# Patient Record
Sex: Male | Born: 1965
Health system: Southern US, Community
[De-identification: ages and names within clinical notes are randomized; demographics above are authoritative.]

## PROBLEM LIST (undated history)

## (undated) DIAGNOSIS — F419 Anxiety disorder, unspecified: Secondary | ICD-10-CM

## (undated) DIAGNOSIS — M779 Enthesopathy, unspecified: Secondary | ICD-10-CM

## (undated) DIAGNOSIS — I10 Essential (primary) hypertension: Secondary | ICD-10-CM

## (undated) DIAGNOSIS — E785 Hyperlipidemia, unspecified: Secondary | ICD-10-CM

## (undated) DIAGNOSIS — R002 Palpitations: Secondary | ICD-10-CM

## (undated) DIAGNOSIS — N182 Chronic kidney disease, stage 2 (mild): Secondary | ICD-10-CM

## (undated) HISTORY — DX: Essential (primary) hypertension: I10

## (undated) HISTORY — DX: Anxiety disorder, unspecified: F41.9

## (undated) HISTORY — PX: COLONOSCOPY: SHX174

## (undated) HISTORY — PX: POLYPECTOMY: SHX149

## (undated) HISTORY — DX: Palpitations: R00.2

## (undated) HISTORY — DX: Hyperlipidemia, unspecified: E78.5

## (undated) HISTORY — PX: MOUTH SURGERY: SHX715

---

## 2018-03-24 ENCOUNTER — Ambulatory Visit (INDEPENDENT_AMBULATORY_CARE_PROVIDER_SITE_OTHER): Payer: Self-pay | Admitting: Nurse Practitioner

## 2018-03-24 VITALS — BP 140/95 | HR 64 | Temp 98.4°F | Resp 16 | Wt 147.6 lb

## 2018-03-24 DIAGNOSIS — R030 Elevated blood-pressure reading, without diagnosis of hypertension: Secondary | ICD-10-CM

## 2018-03-24 MED ORDER — AMLODIPINE BESYLATE 5 MG PO TABS: 5.0000 mg | ORAL_TABLET | Freq: Every day | ORAL | 0 refills | Status: DC

## 2018-03-24 NOTE — Patient Instructions (Addendum)
Preventing Hypertension Please call [facility] tomorrow to make an appointment for next week.  [phone]. Start Amlodipine 5mg  daily.  May return to [facility] in 2 weeks for a BP check if appointment cannot be made before that time.  If you become lightheaded, dizzy or other concerns stop medication and contact our office. Keep diary of blood pressure readings, check at least once daily, twice daily would be best. Go to the ER if you develop chest pain, SOB, difficulty breathing, irregular heart rate or other concerns.  Hypertension, commonly called high blood pressure, is when the force of blood pumping through the arteries is too strong. Arteries are blood vessels that carry blood from the heart throughout the body. Over time, hypertension can damage the arteries and decrease blood flow to important parts of the body, including the brain, heart, and kidneys. Often, hypertension does not cause symptoms until blood pressure is very high. For this reason, it is important to have your blood pressure checked on a regular basis. Hypertension can often be prevented with diet and lifestyle changes. If you already have hypertension, you can control it with diet and lifestyle changes, as well as medicine. What nutrition changes can be made? Maintain a healthy diet. This includes:  Eating less salt (sodium). Ask your health care provider how much sodium is safe for you to have. The general recommendation is to consume less than 1 tsp (2,300 mg) of sodium a day. ? Do not add salt to your food. ? Choose low-sodium options when grocery shopping and eating out.  Limiting fats in your diet. You can do this by eating low-fat or fat-free dairy products and by eating less red meat.  Eating more fruits, vegetables, and whole grains. Make a goal to eat: ? 1-2 cups of fresh fruits and vegetables each day. ? 3-4 servings of whole grains each day.  Avoiding foods and beverages that have added  sugars.  Eating fish that contain healthy fats (omega-3 fatty acids), such as mackerel or salmon.  If you need help putting together a healthy eating plan, try the DASH diet. This diet is high in fruits, vegetables, and whole grains. It is low in sodium, red meat, and added sugars. DASH stands for Dietary Approaches to Stop Hypertension. What lifestyle changes can be made?  Lose weight if you are overweight. Losing just 3?5% of your body weight can help prevent or control hypertension. ? For example, if your present weight is 200 lb (91 kg), a loss of 3-5% of your weight means losing 6-10 lb (2.7-4.5 kg). ? Ask your health care provider to help you with a diet and exercise plan to safely lose weight.  Get enough exercise. Do at least 150 minutes of moderate-intensity exercise each week. ? You could do this in short exercise sessions several times a day, or you could do longer exercise sessions a few times a week. For example, you could take a brisk 10-minute walk or bike ride, 3 times a day, for 5 days a week.  Find ways to reduce stress, such as exercising, meditating, listening to music, or taking a yoga class. If you need help reducing stress, ask your health care provider.  Do not smoke. This includes e-cigarettes. Chemicals in tobacco and nicotine products raise your blood pressure each time you smoke. If you need help quitting, ask your health care provider.  Avoid alcohol. If you drink alcohol, limit alcohol intake to no more than 1 drink a day for nonpregnant  women and 2 drinks a day for men. One drink equals 12 oz of beer, 5 oz of wine, or 1 oz of hard liquor. Why are these changes important? Diet and lifestyle changes can help you prevent hypertension, and they may make you feel better overall and improve your quality of life. If you have hypertension, making these changes will help you control it and help prevent major complications, such as:  Hardening and narrowing of arteries  that supply blood to: ? Your heart. This can cause a heart attack. ? Your brain. This can cause a stroke. ? Your kidneys. This can cause kidney failure.  Stress on your heart muscle, which can cause heart failure.  What can I do to lower my risk?  Work with your health care provider to make a hypertension prevention plan that works for you. Follow your plan and keep all follow-up visits as told by your health care provider.  Learn how to check your blood pressure at home. Make sure that you know your personal target blood pressure, as told by your health care provider. How is this treated? In addition to diet and lifestyle changes, your health care provider may recommend medicines to help lower your blood pressure. You may need to try a few different medicines to find what works best for you. You also may need to take more than one medicine. Take over-the-counter and prescription medicines only as told by your health care provider. Where to find support: Your health care provider can help you prevent hypertension and help you keep your blood pressure at a healthy level. Your local hospital or your community may also provide support services and prevention programs. The American Heart Association offers an online support network at: CheapBootlegs.com.cy Where to find more information: Learn more about hypertension from:  National Heart, Lung, and Blood Institute: ElectronicHangman.is  Centers for Disease Control and Prevention: https://ingram.com/  American Academy of Family Physicians: http://familydoctor.org/familydoctor/en/diseases-conditions/high-blood-pressure.printerview.all.html  Learn more about the DASH diet from:  [place], Lung, and [place]: https://www.reyes.com/  Contact a health care provider if:  You think you are having a reaction to medicines you have  taken.  You have recurrent headaches or feel dizzy.  You have swelling in your ankles.  You have trouble with your vision. Summary  Hypertension often does not cause any symptoms until blood pressure is very high. It is important to get your blood pressure checked regularly.  Diet and lifestyle changes are the most important steps in preventing hypertension.  By keeping your blood pressure in a healthy range, you can prevent complications like heart attack, heart failure, stroke, and kidney failure.  Work with your health care provider to make a hypertension prevention plan that works for you. This information is not intended to replace advice given to you by your health care provider. Make sure you discuss any questions you have with your health care provider. Document Released: 09/08/2015 Document Revised: 05/04/2016 Document Reviewed: 05/04/2016 Elsevier Interactive Patient Education  2018 [employer].  Managing Your Hypertension Hypertension is commonly called high blood pressure. This is when the force of your blood pressing against the walls of your arteries is too strong. Arteries are blood vessels that carry blood from your heart throughout your body. Hypertension forces the heart to work harder to pump blood, and may cause the arteries to become narrow or stiff. Having untreated or uncontrolled hypertension can cause heart attack, stroke, kidney disease, and other problems. What are blood pressure readings? A blood pressure  reading consists of a higher number over a lower number. Ideally, your blood pressure should be below 120/80. The first ("top") number is called the systolic pressure. It is a measure of the pressure in your arteries as your heart beats. The second ("bottom") number is called the diastolic pressure. It is a measure of the pressure in your arteries as the heart relaxes. What does my blood pressure reading mean? Blood pressure is classified into four stages.  Based on your blood pressure reading, your health care provider may use the following stages to determine what type of treatment you need, if any. Systolic pressure and diastolic pressure are measured in a unit called mm Hg. Normal  Systolic pressure: below 322.  Diastolic pressure: below 80. Elevated  Systolic pressure: 025-427.  Diastolic pressure: below 80. Hypertension stage 1  Systolic pressure: 062-376.  Diastolic pressure: 28-31. Hypertension stage 2  Systolic pressure: 517 or above.  Diastolic pressure: 90 or above. What health risks are associated with hypertension? Managing your hypertension is an important responsibility. Uncontrolled hypertension can lead to:  A heart attack.  A stroke.  A weakened blood vessel (aneurysm).  Heart failure.  Kidney damage.  Eye damage.  Metabolic syndrome.  Memory and concentration problems.  What changes can I make to manage my hypertension? Hypertension can be managed by making lifestyle changes and possibly by taking medicines. Your health care provider will help you make a plan to bring your blood pressure within a normal range. Eating and drinking  Eat a diet that is high in fiber and potassium, and low in salt (sodium), added sugar, and fat. An example eating plan is called the DASH (Dietary Approaches to Stop Hypertension) diet. To eat this way: ? Eat plenty of fresh fruits and vegetables. Try to fill half of your plate at each meal with fruits and vegetables. ? Eat whole grains, such as whole wheat pasta, brown rice, or whole grain bread. Fill about one quarter of your plate with whole grains. ? Eat low-fat diary products. ? Avoid fatty cuts of meat, processed or cured meats, and poultry with skin. Fill about one quarter of your plate with lean proteins such as fish, chicken without skin, beans, eggs, and tofu. ? Avoid premade and processed foods. These tend to be higher in sodium, added sugar, and fat.  Reduce  your daily sodium intake. Most people with hypertension should eat less than 1,500 mg of sodium a day.  Limit alcohol intake to no more than 1 drink a day for nonpregnant women and 2 drinks a day for men. One drink equals 12 oz of beer, 5 oz of wine, or 1 oz of hard liquor. Lifestyle  Work with your health care provider to maintain a healthy body weight, or to lose weight. Ask what an ideal weight is for you.  Get at least 30 minutes of exercise that causes your heart to beat faster (aerobic exercise) most days of the week. Activities may include walking, swimming, or biking.  Include exercise to strengthen your muscles (resistance exercise), such as weight lifting, as part of your weekly exercise routine. Try to do these types of exercises for 30 minutes at least 3 days a week.  Do not use any products that contain nicotine or tobacco, such as cigarettes and e-cigarettes. If you need help quitting, ask your health care provider.  Control any long-term (chronic) conditions you have, such as high cholesterol or diabetes. Monitoring  Monitor your blood pressure at home as told  by your health care provider. Your personal target blood pressure may vary depending on your medical conditions, your age, and other factors.  Have your blood pressure checked regularly, as often as told by your health care provider. Working with your health care provider  Review all the medicines you take with your health care provider because there may be side effects or interactions.  Talk with your health care provider about your diet, exercise habits, and other lifestyle factors that may be contributing to hypertension.  Visit your health care provider regularly. Your health care provider can help you create and adjust your plan for managing hypertension. Will I need medicine to control my blood pressure? Your health care provider may prescribe medicine if lifestyle changes are not enough to get your blood  pressure under control, and if:  Your systolic blood pressure is 130 or higher.  Your diastolic blood pressure is 80 or higher.  Take medicines only as told by your health care provider. Follow the directions carefully. Blood pressure medicines must be taken as prescribed. The medicine does not work as well when you skip doses. Skipping doses also puts you at risk for problems. Contact a health care provider if:  You think you are having a reaction to medicines you have taken.  You have repeated (recurrent) headaches.  You feel dizzy.  You have swelling in your ankles.  You have trouble with your vision. Get help right away if:  You develop a severe headache or confusion.  You have unusual weakness or numbness, or you feel faint.  You have severe pain in your chest or abdomen.  You vomit repeatedly.  You have trouble breathing. Summary  Hypertension is when the force of blood pumping through your arteries is too strong. If this condition is not controlled, it may put you at risk for serious complications.  Your personal target blood pressure may vary depending on your medical conditions, your age, and other factors. For most people, a normal blood pressure is less than 120/80.  Hypertension is managed by lifestyle changes, medicines, or both. Lifestyle changes include weight loss, eating a healthy, low-sodium diet, exercising more, and limiting alcohol. This information is not intended to replace advice given to you by your health care provider. Make sure you discuss any questions you have with your health care provider. Document Released: 05/18/2012 Document Revised: 07/22/2016 Document Reviewed: 07/22/2016 Elsevier Interactive Patient Education  2018 [place] Eating Plan DASH stands for "Dietary Approaches to Stop Hypertension." The DASH eating plan is a healthy eating plan that has been shown to reduce high blood pressure (hypertension). It may also reduce your  risk for type 2 diabetes, heart disease, and stroke. The DASH eating plan may also help with weight loss. What are tips for following this plan? General guidelines  Avoid eating more than 2,300 mg (milligrams) of salt (sodium) a day. If you have hypertension, you may need to reduce your sodium intake to 1,500 mg a day.  Limit alcohol intake to no more than 1 drink a day for nonpregnant women and 2 drinks a day for men. One drink equals 12 oz of beer, 5 oz of wine, or 1 oz of hard liquor.  Work with your health care provider to maintain a healthy body weight or to lose weight. Ask what an ideal weight is for you.  Get at least 30 minutes of exercise that causes your heart to beat faster (aerobic exercise) most days of the week. Activities  may include walking, swimming, or biking.  Work with your health care provider or diet and nutrition specialist (dietitian) to adjust your eating plan to your individual calorie needs. Reading food labels  Check food labels for the amount of sodium per serving. Choose foods with less than 5 percent of the Daily Value of sodium. Generally, foods with less than 300 mg of sodium per serving fit into this eating plan.  To find whole grains, look for the word "whole" as the first word in the ingredient list. Shopping  Buy products labeled as "low-sodium" or "no salt added."  Buy fresh foods. Avoid canned foods and premade or frozen meals. Cooking  Avoid adding salt when cooking. Use salt-free seasonings or herbs instead of table salt or sea salt. Check with your health care provider or pharmacist before using salt substitutes.  Do not fry foods. Cook foods using healthy methods such as baking, boiling, grilling, and broiling instead.  Cook with heart-healthy oils, such as olive, canola, soybean, or sunflower oil. Meal planning   Eat a balanced diet that includes: ? 5 or more servings of fruits and vegetables each day. At each meal, try to fill half of  your plate with fruits and vegetables. ? Up to 6-8 servings of whole grains each day. ? Less than 6 oz of lean meat, poultry, or fish each day. A 3-oz serving of meat is about the same size as a deck of cards. One egg equals 1 oz. ? 2 servings of low-fat dairy each day. ? A serving of nuts, seeds, or beans 5 times each week. ? Heart-healthy fats. Healthy fats called Omega-3 fatty acids are found in foods such as flaxseeds and coldwater fish, like sardines, salmon, and mackerel.  Limit how much you eat of the following: ? Canned or prepackaged foods. ? Food that is high in trans fat, such as fried foods. ? Food that is high in saturated fat, such as fatty meat. ? Sweets, desserts, sugary drinks, and other foods with added sugar. ? Full-fat dairy products.  Do not salt foods before eating.  Try to eat at least 2 vegetarian meals each week.  Eat more home-cooked food and less restaurant, buffet, and fast food.  When eating at a restaurant, ask that your food be prepared with less salt or no salt, if possible. What foods are recommended? The items listed may not be a complete list. Talk with your dietitian about what dietary choices are best for you. Grains Whole-grain or whole-wheat bread. Whole-grain or whole-wheat pasta. Brown rice. [place]. Bulgur. Whole-grain and low-sodium cereals. Pita bread. Low-fat, low-sodium crackers. Whole-wheat flour tortillas. Vegetables Fresh or frozen vegetables (raw, steamed, roasted, or grilled). Low-sodium or reduced-sodium tomato and vegetable juice. Low-sodium or reduced-sodium tomato sauce and tomato paste. Low-sodium or reduced-sodium canned vegetables. Fruits All fresh, dried, or frozen fruit. Canned fruit in natural juice (without added sugar). Meat and other protein foods Skinless chicken or [place]. Ground chicken or [place]. Pork with fat trimmed off. Fish and seafood. Egg whites. Dried beans, peas, or lentils. Unsalted nuts, nut butters,  and seeds. Unsalted canned beans. Lean cuts of beef with fat trimmed off. Low-sodium, lean deli meat. Dairy Low-fat (1%) or fat-free (skim) milk. Fat-free, low-fat, or reduced-fat cheeses. Nonfat, low-sodium ricotta or cottage cheese. Low-fat or nonfat yogurt. Low-fat, low-sodium cheese. Fats and oils Soft margarine without trans fats. Vegetable oil. Low-fat, reduced-fat, or light mayonnaise and salad dressings (reduced-sodium). Canola, safflower, olive, soybean, and sunflower oils. Avocado. Seasoning  and other foods Herbs. Spices. Seasoning mixes without salt. Unsalted popcorn and pretzels. Fat-free sweets. What foods are not recommended? The items listed may not be a complete list. Talk with your dietitian about what dietary choices are best for you. Grains Baked goods made with fat, such as croissants, muffins, or some breads. Dry pasta or rice meal packs. Vegetables Creamed or fried vegetables. Vegetables in a cheese sauce. Regular canned vegetables (not low-sodium or reduced-sodium). Regular canned tomato sauce and paste (not low-sodium or reduced-sodium). Regular tomato and vegetable juice (not low-sodium or reduced-sodium). Angie Fava. Olives. Fruits Canned fruit in a light or heavy syrup. Fried fruit. Fruit in cream or butter sauce. Meat and other protein foods Fatty cuts of meat. Ribs. Fried meat. Berniece Salines. Sausage. Bologna and other processed lunch meats. Salami. Fatback. Hotdogs. Bratwurst. Salted nuts and seeds. Canned beans with added salt. Canned or smoked fish. Whole eggs or egg yolks. Chicken or [place] with skin. Dairy Whole or 2% milk, cream, and half-and-half. Whole or full-fat cream cheese. Whole-fat or sweetened yogurt. Full-fat cheese. Nondairy creamers. Whipped toppings. Processed cheese and cheese spreads. Fats and oils Butter. Stick margarine. Lard. Shortening. Ghee. Bacon fat. Tropical oils, such as coconut, palm kernel, or palm oil. Seasoning and other foods Salted popcorn  and pretzels. Onion salt, garlic salt, seasoned salt, table salt, and sea salt. Worcestershire sauce. Tartar sauce. Barbecue sauce. Teriyaki sauce. Soy sauce, including reduced-sodium. Steak sauce. Canned and packaged gravies. Fish sauce. Oyster sauce. Cocktail sauce. Horseradish that you find on the shelf. Ketchup. Mustard. Meat flavorings and tenderizers. Bouillon cubes. Hot sauce and Tabasco sauce. Premade or packaged marinades. Premade or packaged taco seasonings. Relishes. Regular salad dressings. Where to find more information:  National Heart, Lung, and [place]: https://wilson-eaton.com/  American Heart Association: www.heart.org Summary  The DASH eating plan is a healthy eating plan that has been shown to reduce high blood pressure (hypertension). It may also reduce your risk for type 2 diabetes, heart disease, and stroke.  With the DASH eating plan, you should limit salt (sodium) intake to 2,300 mg a day. If you have hypertension, you may need to reduce your sodium intake to 1,500 mg a day.  When on the DASH eating plan, aim to eat more fresh fruits and vegetables, whole grains, lean proteins, low-fat dairy, and heart-healthy fats.  Work with your health care provider or diet and nutrition specialist (dietitian) to adjust your eating plan to your individual calorie needs. This information is not intended to replace advice given to you by your health care provider. Make sure you discuss any questions you have with your health care provider. Document Released: 08/13/2011 Document Revised: 08/17/2016 Document Reviewed: 08/17/2016 Elsevier Interactive Patient Education  2018 [employer].   Hypertension Hypertension, commonly called high blood pressure, is when the force of blood pumping through the arteries is too strong. The arteries are the blood vessels that carry blood from the heart throughout the body. Hypertension forces the heart to work harder to pump blood and may cause arteries  to become narrow or stiff. Having untreated or uncontrolled hypertension can cause heart attacks, strokes, kidney disease, and other problems. A blood pressure reading consists of a higher number over a lower number. Ideally, your blood pressure should be below 120/80. The first ("top") number is called the systolic pressure. It is a measure of the pressure in your arteries as your heart beats. The second ("bottom") number is called the diastolic pressure. It is a measure of the pressure  in your arteries as the heart relaxes. What are the causes? The cause of this condition is not known. What increases the risk? Some risk factors for high blood pressure are under your control. Others are not. Factors you can change  Smoking.  Having type 2 diabetes mellitus, high cholesterol, or both.  Not getting enough exercise or physical activity.  Being overweight.  Having too much fat, sugar, calories, or salt (sodium) in your diet.  Drinking too much alcohol. Factors that are difficult or impossible to change  Having chronic kidney disease.  Having a family history of high blood pressure.  Age. Risk increases with age.  Race. You may be at higher risk if you are African-American.  Gender. Men are at higher risk than women before age 80. After age 36, women are at higher risk than men.  Having obstructive sleep apnea.  Stress. What are the signs or symptoms? Extremely high blood pressure (hypertensive crisis) may cause:  Headache.  Anxiety.  Shortness of breath.  Nosebleed.  Nausea and vomiting.  Severe chest pain.  Jerky movements you cannot control (seizures).  How is this diagnosed? This condition is diagnosed by measuring your blood pressure while you are seated, with your arm resting on a surface. The cuff of the blood pressure monitor will be placed directly against the skin of your upper arm at the level of your heart. It should be measured at least twice using the  same arm. Certain conditions can cause a difference in blood pressure between your right and left arms. Certain factors can cause blood pressure readings to be lower or higher than normal (elevated) for a short period of time:  When your blood pressure is higher when you are in a health care provider's office than when you are at home, this is called white coat hypertension. Most people with this condition do not need medicines.  When your blood pressure is higher at home than when you are in a health care provider's office, this is called masked hypertension. Most people with this condition may need medicines to control blood pressure.  If you have a high blood pressure reading during one visit or you have normal blood pressure with other risk factors:  You may be asked to return on a different day to have your blood pressure checked again.  You may be asked to monitor your blood pressure at home for 1 week or longer.  If you are diagnosed with hypertension, you may have other blood or imaging tests to help your health care provider understand your overall risk for other conditions. How is this treated? This condition is treated by making healthy lifestyle changes, such as eating healthy foods, exercising more, and reducing your alcohol intake. Your health care provider may prescribe medicine if lifestyle changes are not enough to get your blood pressure under control, and if:  Your systolic blood pressure is above 130.  Your diastolic blood pressure is above 80.  Your personal target blood pressure may vary depending on your medical conditions, your age, and other factors. Follow these instructions at home: Eating and drinking  Eat a diet that is high in fiber and potassium, and low in sodium, added sugar, and fat. An example eating plan is called the DASH (Dietary Approaches to Stop Hypertension) diet. To eat this way: ? Eat plenty of fresh fruits and vegetables. Try to fill half of your  plate at each meal with fruits and vegetables. ? Eat whole grains, such as  whole wheat pasta, brown rice, or whole grain bread. Fill about one quarter of your plate with whole grains. ? Eat or drink low-fat dairy products, such as skim milk or low-fat yogurt. ? Avoid fatty cuts of meat, processed or cured meats, and poultry with skin. Fill about one quarter of your plate with lean proteins, such as fish, chicken without skin, beans, eggs, and tofu. ? Avoid premade and processed foods. These tend to be higher in sodium, added sugar, and fat.  Reduce your daily sodium intake. Most people with hypertension should eat less than 1,500 mg of sodium a day.  Limit alcohol intake to no more than 1 drink a day for nonpregnant women and 2 drinks a day for men. One drink equals 12 oz of beer, 5 oz of wine, or 1 oz of hard liquor. Lifestyle  Work with your health care provider to maintain a healthy body weight or to lose weight. Ask what an ideal weight is for you.  Get at least 30 minutes of exercise that causes your heart to beat faster (aerobic exercise) most days of the week. Activities may include walking, swimming, or biking.  Include exercise to strengthen your muscles (resistance exercise), such as pilates or lifting weights, as part of your weekly exercise routine. Try to do these types of exercises for 30 minutes at least 3 days a week.  Do not use any products that contain nicotine or tobacco, such as cigarettes and e-cigarettes. If you need help quitting, ask your health care provider.  Monitor your blood pressure at home as told by your health care provider.  Keep all follow-up visits as told by your health care provider. This is important. Medicines  Take over-the-counter and prescription medicines only as told by your health care provider. Follow directions carefully. Blood pressure medicines must be taken as prescribed.  Do not skip doses of blood pressure medicine. Doing this puts you  at risk for problems and can make the medicine less effective.  Ask your health care provider about side effects or reactions to medicines that you should watch for. Contact a health care provider if:  You think you are having a reaction to a medicine you are taking.  You have headaches that keep coming back (recurring).  You feel dizzy.  You have swelling in your ankles.  You have trouble with your vision. Get help right away if:  You develop a severe headache or confusion.  You have unusual weakness or numbness.  You feel faint.  You have severe pain in your chest or abdomen.  You vomit repeatedly.  You have trouble breathing. Summary  Hypertension is when the force of blood pumping through your arteries is too strong. If this condition is not controlled, it may put you at risk for serious complications.  Your personal target blood pressure may vary depending on your medical conditions, your age, and other factors. For most people, a normal blood pressure is less than 120/80.  Hypertension is treated with lifestyle changes, medicines, or a combination of both. Lifestyle changes include weight loss, eating a healthy, low-sodium diet, exercising more, and limiting alcohol. This information is not intended to replace advice given to you by your health care provider. Make sure you discuss any questions you have with your health care provider. Document Released: 08/24/2005 Document Revised: 07/22/2016 Document Reviewed: 07/22/2016 Elsevier Interactive Patient Education  [employer].

## 2018-03-24 NOTE — Progress Notes (Addendum)
Subjective:    Travis Huynh is a 52 y.o. 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  The patient denies any previous history of HTN.   Cardiac symptoms: none. Patient denies: chest pain, chest pressure/discomfort, dyspnea, exertional chest pressure/discomfort, fatigue, irregular heart beat, lower extremity edema and palpitations. Cardiovascular risk factors: family history of premature cardiovascular disease, male gender, sedentary lifestyle and smoking/ tobacco exposure. Use of agents associated with hypertension: none. History of target organ damage: none.  The patient does admit to being a 20-year pack per day smoker, drinks, and has a diet that contains red meat and pork.  The patient denies exercising at this time.  The following portions of the patient's history were reviewed and updated as appropriate: allergies, current medications and past medical history.  Review of Systems Constitutional: negative Eyes: negative Ears, nose, mouth, throat, and face: negative Respiratory: negative Cardiovascular: positive for elevated BP, negative for See HPI Gastrointestinal: negative Neurological: negative   Objective:    BP (!) 140/95 (BP Location: Right Arm, Patient Position: Sitting, Cuff Size: Normal)   Pulse 64   Temp 98.4 F (36.9 C) (Oral)   Resp 16   Wt 147 lb 9.6 oz (67 kg)   SpO2 98%  General appearance: alert, cooperative and no distress Head: Normocephalic, without obvious abnormality, atraumatic Eyes: conjunctivae/corneas clear. PERRL, EOM's intact. Fundi benign. Ears: normal TM's and external ear canals both ears Nose: Nares normal. Septum midline. Mucosa normal. No drainage or sinus tenderness. Throat: lips, mucosa, and tongue normal; teeth and gums normal Lungs: clear to auscultation bilaterally Heart: regular rate and rhythm, S1, S2  normal, no murmur, click, rub or gallop Abdomen: soft, non-tender; bowel sounds normal; no masses,  no organomegaly Extremities: edema non-pitting edema noted bilaterally, +1 Pulses: 2+ and symmetric Skin: Skin color, texture, turgor normal. No rashes or lesions Lymph nodes: cervical and submandibular nodes normal Neurologic: Grossly normal  Blood pressure recheck: 148/98  Assessment:   Elevated BP without diagnosis of Hypertension.    Plan:  Exam findings, diagnosis etiology and medication use and indications reviewed with patient. Follow- Up and discharge instructions provided. No emergent/urgent issues found on exam.  Patient verbalized understanding of information provided and agrees with plan of care (POC), all questions answered.  I contacted [facility] to schedule an appointment for the patient.  Per receptionist need to speak with Susette Racer, however she was out of the office left a detailed message on Ms. Solomon's voicemail with the patient's information to contact him to schedule an appointment, hopefully within the next week.  Patient is going back to employee health to inform them of his visit with Leodis Binet so he is able to start has an appointment on April 11, 2018.  1. Elevated blood pressure reading in office without diagnosis of hypertension  - amLODipine (NORVASC) 5 MG tablet; Take 1 tablet (5 mg total) by mouth daily.  Dispense: 30 tablet; Refill: 0 --Please call [facility] tomorrow to make an appointment for next week.  [phone].  -Start Amlodipine 5mg  daily.  May return to [facility] in 2 weeks for a BP check if appointment cannot be made before that time.  If you become lightheaded, dizzy or other concerns stop medication and contact our office. -Keep diary of blood pressure readings, check at least once daily, twice daily would be best. -Go to the ER  if you develop chest pain, SOB, difficulty breathing, irregular heart rate  or other concerns.

## 2018-04-11 ENCOUNTER — Ambulatory Visit: Payer: Self-pay | Admitting: Family Medicine

## 2018-04-12 ENCOUNTER — Other Ambulatory Visit: Payer: Self-pay | Admitting: Nurse Practitioner

## 2018-05-14 DIAGNOSIS — H5213 Myopia, bilateral: Secondary | ICD-10-CM | POA: Diagnosis not present

## 2018-05-26 DIAGNOSIS — H538 Other visual disturbances: Secondary | ICD-10-CM | POA: Diagnosis not present

## 2018-05-26 DIAGNOSIS — E782 Mixed hyperlipidemia: Secondary | ICD-10-CM | POA: Diagnosis not present

## 2018-06-01 DIAGNOSIS — Z0000 Encounter for general adult medical examination without abnormal findings: Secondary | ICD-10-CM | POA: Diagnosis not present

## 2019-06-23 ENCOUNTER — Ambulatory Visit: Payer: 59 | Admitting: Family Medicine

## 2019-06-26 ENCOUNTER — Encounter: Payer: Self-pay | Admitting: Family Medicine

## 2019-07-20 DIAGNOSIS — Z20828 Contact with and (suspected) exposure to other viral communicable diseases: Secondary | ICD-10-CM | POA: Diagnosis not present

## 2020-01-12 DIAGNOSIS — L639 Alopecia areata, unspecified: Secondary | ICD-10-CM | POA: Diagnosis not present

## 2020-01-12 MED FILL — CLOBETASOL PROPIONATE 0.05: 0.05 | 14 days supply | Qty: 50 | Fill #0

## 2020-02-21 ENCOUNTER — Ambulatory Visit: Payer: 59 | Admitting: Family Medicine

## 2020-02-21 ENCOUNTER — Encounter: Payer: Self-pay | Admitting: Family Medicine

## 2020-02-21 ENCOUNTER — Other Ambulatory Visit: Payer: Self-pay

## 2020-02-21 VITALS — BP 136/100 | HR 66 | Temp 98.3°F | Ht 67.0 in | Wt 157.0 lb

## 2020-02-21 DIAGNOSIS — L659 Nonscarring hair loss, unspecified: Secondary | ICD-10-CM | POA: Diagnosis not present

## 2020-02-21 DIAGNOSIS — Z1159 Encounter for screening for other viral diseases: Secondary | ICD-10-CM | POA: Diagnosis not present

## 2020-02-21 DIAGNOSIS — I10 Essential (primary) hypertension: Secondary | ICD-10-CM

## 2020-02-21 DIAGNOSIS — Z13220 Encounter for screening for lipoid disorders: Secondary | ICD-10-CM

## 2020-02-21 DIAGNOSIS — Z23 Encounter for immunization: Secondary | ICD-10-CM | POA: Diagnosis not present

## 2020-02-21 DIAGNOSIS — Z1211 Encounter for screening for malignant neoplasm of colon: Secondary | ICD-10-CM | POA: Diagnosis not present

## 2020-02-21 DIAGNOSIS — R079 Chest pain, unspecified: Secondary | ICD-10-CM

## 2020-02-21 DIAGNOSIS — R0789 Other chest pain: Secondary | ICD-10-CM | POA: Diagnosis not present

## 2020-02-21 DIAGNOSIS — Z131 Encounter for screening for diabetes mellitus: Secondary | ICD-10-CM

## 2020-02-21 DIAGNOSIS — N529 Male erectile dysfunction, unspecified: Secondary | ICD-10-CM

## 2020-02-21 MED ORDER — AMLODIPINE BESYLATE 2.5 MG PO TABS: 2.5000 mg | ORAL_TABLET | Freq: Every day | ORAL | 1 refills | Status: DC

## 2020-02-21 NOTE — Patient Instructions (Addendum)
I will check some bloodwork today, but follow up with dermatology.  Try lower dose of amlodipine to see if that is better tolerated.  I will check some other blood work as discussed but can also discussed the erectile dysfunction further next time. Return to the clinic or go to the nearest emergency room if any of your symptoms worsen or new symptoms occur.  Chest pain sounds like chest wall pain. Range of motion as we discussed, but follow up in 2 weeks to discuss further. If any worsening symptoms -be seen in ER or call 911.     Erectile Dysfunction Erectile dysfunction (ED) is the inability to get or keep an erection in order to have sexual intercourse. Erectile dysfunction may include:  Inability to get an erection.  Lack of enough hardness of the erection to allow penetration.  Loss of the erection before sex is finished. What are the causes? This condition may be caused by:  Certain medicines, such as: ? Pain relievers. ? Antihistamines. ? Antidepressants. ? Blood pressure medicines. ? Water pills (diuretics). ? Ulcer medicines. ? Muscle relaxants. ? Drugs.  Excessive drinking.  Psychological causes, such as: ? Anxiety. ? Depression. ? Sadness. ? Exhaustion. ? Performance fear. ? Stress.  Physical causes, such as: ? Artery problems. This may include diabetes, smoking, liver disease, or atherosclerosis. ? High blood pressure. ? Hormonal problems, such as low testosterone. ? Obesity. ? Nerve problems. This may include back or pelvic injuries, diabetes mellitus, multiple sclerosis, or Parkinson disease. What are the signs or symptoms? Symptoms of this condition include:  Inability to get an erection.  Lack of enough hardness of the erection to allow penetration.  Loss of the erection before sex is finished.  Normal erections at some times, but with frequent unsatisfactory episodes.  Low sexual satisfaction in either partner due to erection problems.  A  curved penis occurring with erection. The curve may cause pain or the penis may be too curved to allow for intercourse.  Never having nighttime erections. How is this diagnosed? This condition is often diagnosed by:  Performing a physical exam to find other diseases or specific problems with the penis.  Asking you detailed questions about the problem.  Performing blood tests to check for diabetes mellitus or to measure hormone levels.  Performing other tests to check for underlying health conditions.  Performing an ultrasound exam to check for scarring.  Performing a test to check blood flow to the penis.  Doing a sleep study at home to measure nighttime erections. How is this treated? This condition may be treated by:  Medicine taken by mouth to help you achieve an erection (oral medicine).  Hormone replacement therapy to replace low testosterone levels.  Medicine that is injected into the penis. Your health care provider may instruct you how to give yourself these injections at home.  Vacuum pump. This is a pump with a ring on it. The pump and ring are placed on the penis and used to create pressure that helps the penis become erect.  Penile implant surgery. In this procedure, you may receive: ? An inflatable implant. This consists of cylinders, a pump, and a reservoir. The cylinders can be inflated with a fluid that helps to create an erection, and they can be deflated after intercourse. ? A semi-rigid implant. This consists of two silicone rubber rods. The rods provide some rigidity. They are also flexible, so the penis can both curve downward in its normal position and become straight  for sexual intercourse.  Blood vessel surgery, to improve blood flow to the penis. During this procedure, a blood vessel from a different part of the body is placed into the penis to allow blood to flow around (bypass) damaged or blocked blood vessels.  Lifestyle changes, such as exercising  more, losing weight, and quitting smoking. Follow these instructions at home: Medicines   Take over-the-counter and prescription medicines only as told by your health care provider. Do not increase the dosage without first discussing it with your health care provider.  If you are using self-injections, perform injections as directed by your health care provider. Make sure to avoid any veins that are on the surface of the penis. After giving an injection, apply pressure to the injection site for 5 minutes. General instructions  Exercise regularly, as directed by your health care provider. Work with your health care provider to lose weight, if needed.  Do not use any products that contain nicotine or tobacco, such as cigarettes and e-cigarettes. If you need help quitting, ask your health care provider.  Before using a vacuum pump, read the instructions that come with the pump and discuss any questions with your health care provider.  Keep all follow-up visits as told by your health care provider. This is important. Contact a health care provider if:  You feel nauseous.  You vomit. Get help right away if:  You are taking oral or injectable medicines and you have an erection that lasts longer than 4 hours. If your health care provider is unavailable, go to the nearest emergency room for evaluation. An erection that lasts much longer than 4 hours can result in permanent damage to your penis.  You have severe pain in your groin or abdomen.  You develop redness or severe swelling of your penis.  You have redness spreading up into your groin or lower abdomen.  You are unable to urinate.  You experience chest pain or a rapid heart beat (palpitations) after taking oral medicines. Summary  Erectile dysfunction (ED) is the inability to get or keep an erection during sexual intercourse. This problem can usually be treated successfully.  This condition is diagnosed based on a physical exam,  your symptoms, and tests to determine the cause. Treatment varies depending on the cause, and may include medicines, hormone therapy, surgery, or vacuum pump.  You may need follow-up visits to make sure that you are using your medicines or devices correctly.  Get help right away if you are taking or injecting medicines and you have an erection that lasts longer than 4 hours. This information is not intended to replace advice given to you by your health care provider. Make sure you discuss any questions you have with your health care provider. Document Revised: 08/06/2017 Document Reviewed: 09/09/2016 Elsevier Patient Education  [place].   Chest Wall Pain Chest wall pain is pain in or around the bones and muscles of your chest. Sometimes, an injury causes this pain. Excessive coughing or overuse of arm and chest muscles may also cause chest wall pain. Sometimes, the cause may not be known. This pain may take several weeks or longer to get better. Follow these instructions at home: Managing pain, stiffness, and swelling   If directed, put ice on the painful area: ? Put ice in a plastic bag. ? Place a towel between your skin and the bag. ? Leave the ice on for 20 minutes, 2-3 times per day. Activity  Rest as told by your  health care provider.  Avoid activities that cause pain. These include any activities that use your chest muscles or your abdominal and side muscles to lift heavy items. Ask your health care provider what activities are safe for you. General instructions   Take over-the-counter and prescription medicines only as told by your health care provider.  Do not use any products that contain nicotine or tobacco, such as cigarettes, e-cigarettes, and chewing tobacco. These can delay healing after injury. If you need help quitting, ask your health care provider.  Keep all follow-up visits as told by your health care provider. This is important. Contact a health care  provider if:  You have a fever.  Your chest pain becomes worse.  You have new symptoms. Get help right away if:  You have nausea or vomiting.  You feel sweaty or light-headed.  You have a cough with mucus from your lungs (sputum) or you cough up blood.  You develop shortness of breath. These symptoms may represent a serious problem that is an emergency. Do not wait to see if the symptoms will go away. Get medical help right away. Call your local emergency services (911 in the [place] Do not drive yourself to the hospital. Summary  Chest wall pain is pain in or around the bones and muscles of your chest.  Depending on the cause, it may be treated with ice, rest, medicines, and avoiding activities that cause pain.  Contact a health care provider if you have a fever, worsening chest pain, or new symptoms.  Get help right away if you feel light-headed or you develop shortness of breath. These symptoms may be an emergency. This information is not intended to replace advice given to you by your health care provider. Make sure you discuss any questions you have with your health care provider. Document Revised: 02/24/2018 Document Reviewed: 02/24/2018 Elsevier Patient Education  [place].   Nonspecific Chest Pain, Adult Chest pain can be caused by many different conditions. It can be caused by a condition that is life-threatening and requires treatment right away. It can also be caused by something that is not life-threatening. If you have chest pain, it can be hard to know the difference, so it is important to get help right away to make sure that you do not have a serious condition. Some life-threatening causes of chest pain include:  Heart attack.  A tear in the body's main blood vessel (aortic dissection).  Inflammation around your heart (pericarditis).  A problem in the lungs, such as a blood clot (pulmonary embolism) or a collapsed lung (pneumothorax). Some non  life-threatening causes of chest pain include:  Heartburn.  Anxiety or stress.  Damage to the bones, muscles, and cartilage that make up your chest wall.  Pneumonia or bronchitis.  Shingles infection (varicella-zoster virus). Chest pain can feel like:  Pain or discomfort on the surface of your chest or deep in your chest.  Crushing, pressure, aching, or squeezing pain.  Burning or tingling.  Dull or sharp pain that is worse when you move, cough, or take a deep breath.  Pain or discomfort that is also felt in your back, neck, jaw, shoulder, or arm, or pain that spreads to any of these areas. Your chest pain may come and go. It may also be constant. Your health care provider will do lab tests and other studies to find the cause of your pain. Treatment will depend on the cause of your chest pain. Follow these instructions at  home: Medicines  Take over-the-counter and prescription medicines only as told by your health care provider.  If you were prescribed an antibiotic, take it as told by your health care provider. Do not stop taking the antibiotic even if you start to feel better. Lifestyle   Rest as directed by your health care provider.  Do not use any products that contain nicotine or tobacco, such as cigarettes and e-cigarettes. If you need help quitting, ask your health care provider.  Do not drink alcohol.  Make healthy lifestyle choices as recommended. These may include: ? Getting regular exercise. Ask your health care provider to suggest some activities that are safe for you. ? Eating a heart-healthy diet. This includes plenty of fresh fruits and vegetables, whole grains, low-fat (lean) protein, and low-fat dairy products. A dietitian can help you find healthy eating options. ? Maintaining a healthy weight. ? Managing any other health conditions you have, such as high blood pressure (hypertension) or diabetes. ? Reducing stress, such as with yoga or relaxation  techniques. General instructions  Pay attention to any changes in your symptoms. Tell your health care provider about them or any new symptoms.  Avoid any activities that cause chest pain.  Keep all follow-up visits as told by your health care provider. This is important. This includes visits for any further testing if your chest pain does not go away. Contact a health care provider if:  Your chest pain does not go away.  You feel depressed.  You have a fever. Get help right away if:  Your chest pain gets worse.  You have a cough that gets worse, or you cough up blood.  You have severe pain in your abdomen.  You faint.  You have sudden, unexplained chest discomfort.  You have sudden, unexplained discomfort in your arms, back, neck, or jaw.  You have shortness of breath at any time.  You suddenly start to sweat, or your skin gets clammy.  You feel nausea or you vomit.  You suddenly feel lightheaded or dizzy.  You have severe weakness, or unexplained weakness or fatigue.  Your heart begins to beat quickly, or it feels like it is skipping beats. These symptoms may represent a serious problem that is an emergency. Do not wait to see if the symptoms will go away. Get medical help right away. Call your local emergency services (911 in the [place] Do not drive yourself to the hospital. Summary  Chest pain can be caused by a condition that is serious and requires urgent treatment. It may also be caused by something that is not life-threatening.  If you have chest pain, it is very important to see your health care provider. Your health care provider may do lab tests and other studies to find the cause of your pain.  Follow your health care provider's instructions on taking medicines, making lifestyle changes, and getting emergency treatment if symptoms become worse.  Keep all follow-up visits as told by your health care provider. This includes visits for any further testing if  your chest pain does not go away. This information is not intended to replace advice given to you by your health care provider. Make sure you discuss any questions you have with your health care provider. Document Revised: 02/24/2018 Document Reviewed: 02/24/2018 Elsevier Patient Education  [place].   Alopecia Areata, Adult  Alopecia areata is a condition that causes you to lose hair. You may lose hair on your scalp in patches. In some cases,  you may lose all the hair on your scalp (alopecia totalis) or all the hair from your face and body (alopecia universalis). Alopecia areata is an autoimmune disease. This means that your body's defense system (immune system) mistakes normal parts of the body for germs or other things that can make you sick. When you have alopecia areata, the immune system attacks the hair follicles. Alopecia areata usually develops in childhood, but it can develop at any age. For some people, their hair grows back on its own and hair loss does not happen again. For others, their hair may fall out and grow back in cycles. The hair loss may last many years. Having this condition can be emotionally difficult, but it is not dangerous. What are the causes? The cause of this condition is not known. What increases the risk? This condition is more likely to develop in people who have:  A family history of alopecia.  A family history of another autoimmune disease, including type 1 diabetes and rheumatoid arthritis.  Asthma and allergies.  Down syndrome. What are the signs or symptoms? Round spots of patchy hair loss on the scalp is the main symptom of this condition. The spots may be mildly itchy. Other symptoms include:  Short dark hairs in the bald patches that are wider at the top (exclamation point hairs).  Dents, white spots, or lines in the fingernails or toenails.  Balding and body hair loss. This is rare. How is this diagnosed? This condition is  diagnosed based on your symptoms and family history. Your health care provider will also check your scalp skin, teeth, and nails. Your health care provider may refer you to a specialist in hair and skin disorders (dermatologist). You may also have tests, including:  A hair pull test.  Blood tests or other screening tests to check for autoimmune diseases, such as thyroid disease or diabetes.  Skin biopsy to confirm the diagnosis.  A procedure to examine the skin with a lighted magnifying instrument (dermoscopy). How is this treated? There is no cure for alopecia areata. Treatment is aimed at promoting the regrowth of hair and preventing the immune system from overreacting. No single treatment is right for all people with alopecia areata. It depends on the type of hair loss you have and how severe it is. Work with your health care provider to find the best treatment for you. Treatment may include:  Having regular checkups to make sure the condition is not getting worse (watchful waiting).  Steroid creams or pills for 6-8 weeks to stop the immune reaction and help hair to regrow more quickly.  Other topical medicines to alter the immune system response and support the hair growth cycle.  Steroid injections.  Therapy and counseling with a support group or therapist if you are having trouble coping with hair loss. Follow these instructions at home:  Learn as much as you can about your condition.  Apply topical creams only as told by your health care provider.  Take over-the-counter and prescription medicines only as told by your health care provider.  Consider getting a wig or products to make hair look fuller or to cover bald spots, if you feel uncomfortable with your appearance.  Get therapy or counseling if you are having a hard time coping with hair loss. Ask your health care provider to recommend a counselor or support group.  Keep all follow-up visits as told by your health care  provider. This is important. Contact a health care provider if:  Your hair loss gets worse, even with treatment.  You have new symptoms.  You are struggling emotionally. Summary  Alopecia areata is an autoimmune condition that makes your body's defense system (immune system) attack the hair follicles. This causes you to lose hair.  Treatments may include regular checkups to make sure that the condition is not getting worse (watchful waiting), medicines, and steroid injections. This information is not intended to replace advice given to you by your health care provider. Make sure you discuss any questions you have with your health care provider. Document Revised: 08/06/2017 Document Reviewed: 09/11/2016 Elsevier Patient Education  [employer].    If you have lab work done today you will be contacted with your lab results within the next 2 weeks.  If you have not heard from [place] then please contact us. The fastest way to get your results is to register for My Chart.   IF you received an x-ray today, you will receive an invoice from [facility]. Please contact [facility] at [phone] with questions or concerns regarding your invoice.   IF you received labwork today, you will receive an invoice from [place]. Please contact [facility] at [phone] with questions or concerns regarding your invoice.   Our billing staff will not be able to assist you with questions regarding bills from these companies.  You will be contacted with the lab results as soon as they are available. The fastest way to get your results is to activate your My Chart account. Instructions are located on the last page of this paperwork. If you have not heard from [place] regarding the results in 2 weeks, please contact this office.

## 2020-02-21 NOTE — Progress Notes (Signed)
Subjective:  Patient ID: Travis Huynh, male    DOB: 1965-10-26  Age: 54 y.o. MRN: [id number]  CC:  Chief Complaint  Patient presents with  . Establish Care    pt reports as far as his general health he feels fine with no complaints. Pt want BLood work done to fine out why hair is falling out.    HPI Travis Huynh presents for   New patient establish care.   Hair loss: Appears he was evaluated by Dr. Renda Rolls on May 7.  Alopecia Areata.  Steroid injections were performed at that time, and clobetasol Rx..  ?bloodwork recommended - routine blood work - no specific recommendations.  Follow up to be determined.   Hypertension: Previously treated with amlodipine 5mg  daily. Did not like how he felt - like in another world. No recent meds.  Home readings: none.  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   Chest pain: Chest pains at work at times, with activity. left or right, but in muscles and able to reproduce with pressing on area. Feels like spasm in muscle. No radiation.  No n/v/dyspena/diaphoresis. Comes and goes.  FH of MI - mom at 62yo. No personal hx of heart disease.   BP Readings from Last 3 Encounters:  02/21/20 (!) 136/100  03/24/18 (!) 140/95   Lab Results  Component Value Date   CREATININE 1.01 02/21/2020     Erectile dysfunction: Requests testoterone level. Longstanding issue. Trouble with maintaining erection, able to obtain. No supplements. Marriage is going well.   HM: Requests colonoscopy.    History There are no problems to display for this patient.  History reviewed. No pertinent past medical history. History reviewed. No pertinent surgical history. No Known Allergies Prior to Admission medications   Medication Sig Start Date End Date Taking? Authorizing Provider  clobetasol (TEMOVATE) 0.05 % external solution Apply 1 application topically 2 (two) times daily. 01/12/20  Yes [provider]   Social History    Socioeconomic History  . Marital status: Unknown    Spouse name: Not on file  . Number of children: Not on file  . Years of education: Not on file  . Highest education level: Not on file  Occupational History  . Not on file  Tobacco Use  . Smoking status: Current Every Day Smoker    Packs/day: 0.25    Types: Cigarettes  . Smokeless tobacco: Never Used  Vaping Use  . Vaping Use: Never used  Substance and Sexual Activity  . Alcohol use: Yes    Alcohol/week: 1.0 standard drink    Types: 1 Cans of beer per week    Comment: 1-2 after work  . Drug use: Never  . Sexual activity: Yes  Other Topics Concern  . Not on file  Social History Narrative  . Not on file   Social Determinants of Health   Financial Resource Strain:   . Difficulty of Paying Living Expenses:   Food Insecurity:   . Worried About Charity fundraiser in the Last Year:   . Arboriculturist in the Last Year:   Transportation Needs:   . Film/video editor (Medical):   Marland Kitchen Lack of Transportation (Non-Medical):   Physical Activity:   . Days of Exercise per Week:   . Minutes of Exercise per Session:   Stress:   . Feeling of Stress :   Social Connections:   . Frequency of Communication with Friends and Family:   .  Frequency of Social Gatherings with Friends and Family:   . Attends Religious Services:   . Active Member of Clubs or Organizations:   . Attends Archivist Meetings:   Marland Kitchen Marital Status:   Intimate Partner Violence:   . Fear of Current or Ex-Partner:   . Emotionally Abused:   Marland Kitchen Physically Abused:   . Sexually Abused:     Review of Systems Per HPI.   Objective:   Vitals:   02/21/20 1003 02/21/20 1016  BP: (!) 169/106 (!) 136/100  Pulse: 66   Temp: 98.3 F (36.8 C)   TempSrc: Temporal   SpO2: 98%   Weight: 157 lb (71.2 kg)   Height: 5\' 7"  (1.702 m)      Physical Exam Vitals reviewed.  Constitutional:      Appearance: He is well-developed.  HENT:     Head:  Normocephalic and atraumatic.  Eyes:     Pupils: Pupils are equal, round, and reactive to light.  Neck:     Vascular: No carotid bruit or JVD.  Cardiovascular:     Rate and Rhythm: Normal rate and regular rhythm.     Heart sounds: Normal heart sounds. No murmur heard.      Comments: Denies active chest pain at this time.  Chest wall nontender. Pulmonary:     Effort: Pulmonary effort is normal.     Breath sounds: Normal breath sounds. No rales.  Skin:    General: Skin is warm and dry.     Comments: Multiple patches of hair loss of scalp, few areas in eyebrows.  Neurological:     General: No focal deficit present.     Mental Status: He is alert and oriented to person, place, and time.    EKG: sinus bradycardia, rate 58. No acute findings, no prior EKG available for review.   Assessment & Plan:  Travis Huynh is a 54 y.o. male . Essential hypertension - Plan: Comprehensive metabolic panel, Lipid panel, Hemoglobin A1c, TSH, amLODipine (NORVASC) 2.5 MG tablet  - trial of lower dose amlodipine than prior, check labs,   Need for hepatitis C screening test - Plan: Hepatitis C antibody  Need for prophylactic vaccination with combined diphtheria-tetanus-pertussis (DTP) vaccine - Plan: Tdap vaccine greater than or equal to 7yo IM  Special screening for malignant neoplasms, colon - Plan: Ambulatory referral to Gastroenterology -  Refer for colonoscopy.   Chest wall pain - Plan: EKG 12-Lead Nonspecific chest pain  -Suspected chest wall pain with reproducible symptoms, asymptomatic at present.  EKG without concerning findings.  RTC/ER precautions if recurrent with history of tobacco use and hypertension as risk factors.  Alopecia  -Alopecia areata, followed by dermatology.  Erectile dysfunction, unspecified erectile dysfunction type - Plan: Testosterone, Free, Total, SHBG  -Check testosterone, plan to follow-up to discuss further  Screening for diabetes mellitus Screening for  hyperlipidemia  -Labs above    Meds ordered this encounter  Medications  . amLODipine (NORVASC) 2.5 MG tablet    Sig: Take 1 tablet (2.5 mg total) by mouth daily.    Dispense:  90 tablet    Refill:  1   Patient Instructions   I will check some bloodwork today, but follow up with dermatology.  Try lower dose of amlodipine to see if that is better tolerated.  I will check some other blood work as discussed but can also discussed the erectile dysfunction further next time. Return to the clinic or go to the nearest emergency room if any  of your symptoms worsen or new symptoms occur.  Chest pain sounds like chest wall pain. Range of motion as we discussed, but follow up in 2 weeks to discuss further. If any worsening symptoms -be seen in ER or call 911.     Erectile Dysfunction Erectile dysfunction (ED) is the inability to get or keep an erection in order to have sexual intercourse. Erectile dysfunction may include:  Inability to get an erection.  Lack of enough hardness of the erection to allow penetration.  Loss of the erection before sex is finished. What are the causes? This condition may be caused by:  Certain medicines, such as: ? Pain relievers. ? Antihistamines. ? Antidepressants. ? Blood pressure medicines. ? Water pills (diuretics). ? Ulcer medicines. ? Muscle relaxants. ? Drugs.  Excessive drinking.  Psychological causes, such as: ? Anxiety. ? Depression. ? Sadness. ? Exhaustion. ? Performance fear. ? Stress.  Physical causes, such as: ? Artery problems. This may include diabetes, smoking, liver disease, or atherosclerosis. ? High blood pressure. ? Hormonal problems, such as low testosterone. ? Obesity. ? Nerve problems. This may include back or pelvic injuries, diabetes mellitus, multiple sclerosis, or Parkinson disease. What are the signs or symptoms? Symptoms of this condition include:  Inability to get an erection.  Lack of enough hardness of  the erection to allow penetration.  Loss of the erection before sex is finished.  Normal erections at some times, but with frequent unsatisfactory episodes.  Low sexual satisfaction in either partner due to erection problems.  A curved penis occurring with erection. The curve may cause pain or the penis may be too curved to allow for intercourse.  Never having nighttime erections. How is this diagnosed? This condition is often diagnosed by:  Performing a physical exam to find other diseases or specific problems with the penis.  Asking you detailed questions about the problem.  Performing blood tests to check for diabetes mellitus or to measure hormone levels.  Performing other tests to check for underlying health conditions.  Performing an ultrasound exam to check for scarring.  Performing a test to check blood flow to the penis.  Doing a sleep study at home to measure nighttime erections. How is this treated? This condition may be treated by:  Medicine taken by mouth to help you achieve an erection (oral medicine).  Hormone replacement therapy to replace low testosterone levels.  Medicine that is injected into the penis. Your health care provider may instruct you how to give yourself these injections at home.  Vacuum pump. This is a pump with a ring on it. The pump and ring are placed on the penis and used to create pressure that helps the penis become erect.  Penile implant surgery. In this procedure, you may receive: ? An inflatable implant. This consists of cylinders, a pump, and a reservoir. The cylinders can be inflated with a fluid that helps to create an erection, and they can be deflated after intercourse. ? A semi-rigid implant. This consists of two silicone rubber rods. The rods provide some rigidity. They are also flexible, so the penis can both curve downward in its normal position and become straight for sexual intercourse.  Blood vessel surgery, to improve  blood flow to the penis. During this procedure, a blood vessel from a different part of the body is placed into the penis to allow blood to flow around (bypass) damaged or blocked blood vessels.  Lifestyle changes, such as exercising more, losing weight, and quitting smoking.  Follow these instructions at home: Medicines   Take over-the-counter and prescription medicines only as told by your health care provider. Do not increase the dosage without first discussing it with your health care provider.  If you are using self-injections, perform injections as directed by your health care provider. Make sure to avoid any veins that are on the surface of the penis. After giving an injection, apply pressure to the injection site for 5 minutes. General instructions  Exercise regularly, as directed by your health care provider. Work with your health care provider to lose weight, if needed.  Do not use any products that contain nicotine or tobacco, such as cigarettes and e-cigarettes. If you need help quitting, ask your health care provider.  Before using a vacuum pump, read the instructions that come with the pump and discuss any questions with your health care provider.  Keep all follow-up visits as told by your health care provider. This is important. Contact a health care provider if:  You feel nauseous.  You vomit. Get help right away if:  You are taking oral or injectable medicines and you have an erection that lasts longer than 4 hours. If your health care provider is unavailable, go to the nearest emergency room for evaluation. An erection that lasts much longer than 4 hours can result in permanent damage to your penis.  You have severe pain in your groin or abdomen.  You develop redness or severe swelling of your penis.  You have redness spreading up into your groin or lower abdomen.  You are unable to urinate.  You experience chest pain or a rapid heart beat (palpitations) after  taking oral medicines. Summary  Erectile dysfunction (ED) is the inability to get or keep an erection during sexual intercourse. This problem can usually be treated successfully.  This condition is diagnosed based on a physical exam, your symptoms, and tests to determine the cause. Treatment varies depending on the cause, and may include medicines, hormone therapy, surgery, or vacuum pump.  You may need follow-up visits to make sure that you are using your medicines or devices correctly.  Get help right away if you are taking or injecting medicines and you have an erection that lasts longer than 4 hours. This information is not intended to replace advice given to you by your health care provider. Make sure you discuss any questions you have with your health care provider. Document Revised: 08/06/2017 Document Reviewed: 09/09/2016 Elsevier Patient Education  [place].   Chest Wall Pain Chest wall pain is pain in or around the bones and muscles of your chest. Sometimes, an injury causes this pain. Excessive coughing or overuse of arm and chest muscles may also cause chest wall pain. Sometimes, the cause may not be known. This pain may take several weeks or longer to get better. Follow these instructions at home: Managing pain, stiffness, and swelling   If directed, put ice on the painful area: ? Put ice in a plastic bag. ? Place a towel between your skin and the bag. ? Leave the ice on for 20 minutes, 2-3 times per day. Activity  Rest as told by your health care provider.  Avoid activities that cause pain. These include any activities that use your chest muscles or your abdominal and side muscles to lift heavy items. Ask your health care provider what activities are safe for you. General instructions   Take over-the-counter and prescription medicines only as told by your health care provider.  Do not use any products that contain nicotine or tobacco, such as cigarettes,  e-cigarettes, and chewing tobacco. These can delay healing after injury. If you need help quitting, ask your health care provider.  Keep all follow-up visits as told by your health care provider. This is important. Contact a health care provider if:  You have a fever.  Your chest pain becomes worse.  You have new symptoms. Get help right away if:  You have nausea or vomiting.  You feel sweaty or light-headed.  You have a cough with mucus from your lungs (sputum) or you cough up blood.  You develop shortness of breath. These symptoms may represent a serious problem that is an emergency. Do not wait to see if the symptoms will go away. Get medical help right away. Call your local emergency services (911 in the [place] Do not drive yourself to the hospital. Summary  Chest wall pain is pain in or around the bones and muscles of your chest.  Depending on the cause, it may be treated with ice, rest, medicines, and avoiding activities that cause pain.  Contact a health care provider if you have a fever, worsening chest pain, or new symptoms.  Get help right away if you feel light-headed or you develop shortness of breath. These symptoms may be an emergency. This information is not intended to replace advice given to you by your health care provider. Make sure you discuss any questions you have with your health care provider. Document Revised: 02/24/2018 Document Reviewed: 02/24/2018 Elsevier Patient Education  [place].   Nonspecific Chest Pain, Adult Chest pain can be caused by many different conditions. It can be caused by a condition that is life-threatening and requires treatment right away. It can also be caused by something that is not life-threatening. If you have chest pain, it can be hard to know the difference, so it is important to get help right away to make sure that you do not have a serious condition. Some life-threatening causes of chest pain include:  Heart  attack.  A tear in the body's main blood vessel (aortic dissection).  Inflammation around your heart (pericarditis).  A problem in the lungs, such as a blood clot (pulmonary embolism) or a collapsed lung (pneumothorax). Some non life-threatening causes of chest pain include:  Heartburn.  Anxiety or stress.  Damage to the bones, muscles, and cartilage that make up your chest wall.  Pneumonia or bronchitis.  Shingles infection (varicella-zoster virus). Chest pain can feel like:  Pain or discomfort on the surface of your chest or deep in your chest.  Crushing, pressure, aching, or squeezing pain.  Burning or tingling.  Dull or sharp pain that is worse when you move, cough, or take a deep breath.  Pain or discomfort that is also felt in your back, neck, jaw, shoulder, or arm, or pain that spreads to any of these areas. Your chest pain may come and go. It may also be constant. Your health care provider will do lab tests and other studies to find the cause of your pain. Treatment will depend on the cause of your chest pain. Follow these instructions at home: Medicines  Take over-the-counter and prescription medicines only as told by your health care provider.  If you were prescribed an antibiotic, take it as told by your health care provider. Do not stop taking the antibiotic even if you start to feel better. Lifestyle   Rest as directed by your health care provider.  Do not use any products that contain nicotine or tobacco, such as cigarettes and e-cigarettes. If you need help quitting, ask your health care provider.  Do not drink alcohol.  Make healthy lifestyle choices as recommended. These may include: ? Getting regular exercise. Ask your health care provider to suggest some activities that are safe for you. ? Eating a heart-healthy diet. This includes plenty of fresh fruits and vegetables, whole grains, low-fat (lean) protein, and low-fat dairy products. A dietitian can  help you find healthy eating options. ? Maintaining a healthy weight. ? Managing any other health conditions you have, such as high blood pressure (hypertension) or diabetes. ? Reducing stress, such as with yoga or relaxation techniques. General instructions  Pay attention to any changes in your symptoms. Tell your health care provider about them or any new symptoms.  Avoid any activities that cause chest pain.  Keep all follow-up visits as told by your health care provider. This is important. This includes visits for any further testing if your chest pain does not go away. Contact a health care provider if:  Your chest pain does not go away.  You feel depressed.  You have a fever. Get help right away if:  Your chest pain gets worse.  You have a cough that gets worse, or you cough up blood.  You have severe pain in your abdomen.  You faint.  You have sudden, unexplained chest discomfort.  You have sudden, unexplained discomfort in your arms, back, neck, or jaw.  You have shortness of breath at any time.  You suddenly start to sweat, or your skin gets clammy.  You feel nausea or you vomit.  You suddenly feel lightheaded or dizzy.  You have severe weakness, or unexplained weakness or fatigue.  Your heart begins to beat quickly, or it feels like it is skipping beats. These symptoms may represent a serious problem that is an emergency. Do not wait to see if the symptoms will go away. Get medical help right away. Call your local emergency services (911 in the [place] Do not drive yourself to the hospital. Summary  Chest pain can be caused by a condition that is serious and requires urgent treatment. It may also be caused by something that is not life-threatening.  If you have chest pain, it is very important to see your health care provider. Your health care provider may do lab tests and other studies to find the cause of your pain.  Follow your health care provider's  instructions on taking medicines, making lifestyle changes, and getting emergency treatment if symptoms become worse.  Keep all follow-up visits as told by your health care provider. This includes visits for any further testing if your chest pain does not go away. This information is not intended to replace advice given to you by your health care provider. Make sure you discuss any questions you have with your health care provider. Document Revised: 02/24/2018 Document Reviewed: 02/24/2018 Elsevier Patient Education  [place].   Alopecia Areata, Adult  Alopecia areata is a condition that causes you to lose hair. You may lose hair on your scalp in patches. In some cases, you may lose all the hair on your scalp (alopecia totalis) or all the hair from your face and body (alopecia universalis). Alopecia areata is an autoimmune disease. This means that your body's defense system (immune system) mistakes normal parts of the body for germs or other things that can make you sick. When you have alopecia  areata, the immune system attacks the hair follicles. Alopecia areata usually develops in childhood, but it can develop at any age. For some people, their hair grows back on its own and hair loss does not happen again. For others, their hair may fall out and grow back in cycles. The hair loss may last many years. Having this condition can be emotionally difficult, but it is not dangerous. What are the causes? The cause of this condition is not known. What increases the risk? This condition is more likely to develop in people who have:  A family history of alopecia.  A family history of another autoimmune disease, including type 1 diabetes and rheumatoid arthritis.  Asthma and allergies.  Down syndrome. What are the signs or symptoms? Round spots of patchy hair loss on the scalp is the main symptom of this condition. The spots may be mildly itchy. Other symptoms include:  Short dark hairs  in the bald patches that are wider at the top (exclamation point hairs).  Dents, white spots, or lines in the fingernails or toenails.  Balding and body hair loss. This is rare. How is this diagnosed? This condition is diagnosed based on your symptoms and family history. Your health care provider will also check your scalp skin, teeth, and nails. Your health care provider may refer you to a specialist in hair and skin disorders (dermatologist). You may also have tests, including:  A hair pull test.  Blood tests or other screening tests to check for autoimmune diseases, such as thyroid disease or diabetes.  Skin biopsy to confirm the diagnosis.  A procedure to examine the skin with a lighted magnifying instrument (dermoscopy). How is this treated? There is no cure for alopecia areata. Treatment is aimed at promoting the regrowth of hair and preventing the immune system from overreacting. No single treatment is right for all people with alopecia areata. It depends on the type of hair loss you have and how severe it is. Work with your health care provider to find the best treatment for you. Treatment may include:  Having regular checkups to make sure the condition is not getting worse (watchful waiting).  Steroid creams or pills for 6-8 weeks to stop the immune reaction and help hair to regrow more quickly.  Other topical medicines to alter the immune system response and support the hair growth cycle.  Steroid injections.  Therapy and counseling with a support group or therapist if you are having trouble coping with hair loss. Follow these instructions at home:  Learn as much as you can about your condition.  Apply topical creams only as told by your health care provider.  Take over-the-counter and prescription medicines only as told by your health care provider.  Consider getting a wig or products to make hair look fuller or to cover bald spots, if you feel uncomfortable with your  appearance.  Get therapy or counseling if you are having a hard time coping with hair loss. Ask your health care provider to recommend a counselor or support group.  Keep all follow-up visits as told by your health care provider. This is important. Contact a health care provider if:  Your hair loss gets worse, even with treatment.  You have new symptoms.  You are struggling emotionally. Summary  Alopecia areata is an autoimmune condition that makes your body's defense system (immune system) attack the hair follicles. This causes you to lose hair.  Treatments may include regular checkups to make sure that the condition  is not getting worse (watchful waiting), medicines, and steroid injections. This information is not intended to replace advice given to you by your health care provider. Make sure you discuss any questions you have with your health care provider. Document Revised: 08/06/2017 Document Reviewed: 09/11/2016 Elsevier Patient Education  [employer].    If you have lab work done today you will be contacted with your lab results within the next 2 weeks.  If you have not heard from [place] then please contact us. The fastest way to get your results is to register for My Chart.   IF you received an x-ray today, you will receive an invoice from [facility]. Please contact [facility] at [phone] with questions or concerns regarding your invoice.   IF you received labwork today, you will receive an invoice from [place]. Please contact [facility] at [phone] with questions or concerns regarding your invoice.   Our billing staff will not be able to assist you with questions regarding bills from these companies.  You will be contacted with the lab results as soon as they are available. The fastest way to get your results is to activate your My Chart account. Instructions are located on the last page of this paperwork. If you have not heard from [place] regarding  the results in 2 weeks, please contact this office.         Signed, Merri Ray, MD [facility]

## 2020-02-23 ENCOUNTER — Encounter: Payer: Self-pay | Admitting: Family Medicine

## 2020-02-24 LAB — COMPREHENSIVE METABOLIC PANEL
ALT: 25 IU/L (ref 0–44)
AST: 18 IU/L (ref 0–40)
Albumin/Globulin Ratio: 1.5 (ref 1.2–2.2)
Albumin: 4.5 g/dL (ref 3.8–4.9)
Alkaline Phosphatase: 75 IU/L (ref 48–121)
BUN/Creatinine Ratio: 11 (ref 9–20)
BUN: 11 mg/dL (ref 6–24)
Bilirubin Total: 0.6 mg/dL (ref 0.0–1.2)
CO2: 20 mmol/L (ref 20–29)
Calcium: 9.5 mg/dL (ref 8.7–10.2)
Chloride: 105 mmol/L (ref 96–106)
Creatinine, Ser: 1.01 mg/dL (ref 0.76–1.27)
GFR calc Af Amer: 97 mL/min/{1.73_m2} (ref >59)
GFR calc non Af Amer: 84 mL/min/{1.73_m2} (ref >59)
Globulin, Total: 3 g/dL (ref 1.5–4.5)
Glucose: 96 mg/dL (ref 65–99)
Potassium: 4.1 mmol/L (ref 3.5–5.2)
Sodium: 141 mmol/L (ref 134–144)
Total Protein: 7.5 g/dL (ref 6.0–8.5)

## 2020-02-24 LAB — LIPID PANEL
Chol/HDL Ratio: 4.1 ratio (ref 0.0–5.0)
Cholesterol, Total: 208 mg/dL — ABNORMAL HIGH (ref 100–199)
HDL: 51 mg/dL (ref >39)
LDL Chol Calc (NIH): 134 mg/dL — ABNORMAL HIGH (ref 0–99)
Triglycerides: 131 mg/dL (ref 0–149)
VLDL Cholesterol Cal: 23 mg/dL (ref 5–40)

## 2020-02-24 LAB — TSH: TSH: 1.47 u[IU]/mL (ref 0.450–4.500)

## 2020-02-24 LAB — HEMOGLOBIN A1C
Est. average glucose Bld gHb Est-mCnc: 120 mg/dL
Hgb A1c MFr Bld: 5.8 % — ABNORMAL HIGH (ref 4.8–5.6)

## 2020-02-24 LAB — HEPATITIS C ANTIBODY: Hep C Virus Ab: <0.1 s/co ratio (ref 0.0–0.9)

## 2020-02-24 LAB — TESTOSTERONE, FREE, TOTAL, SHBG
Sex Hormone Binding: 31 nmol/L (ref 19.3–76.4)
Testosterone, Free: 15.9 pg/mL (ref 7.2–24.0)
Testosterone: 267 ng/dL (ref 264–916)

## 2020-02-26 ENCOUNTER — Encounter: Payer: Self-pay | Admitting: Gastroenterology

## 2020-03-04 ENCOUNTER — Other Ambulatory Visit: Payer: Self-pay

## 2020-03-04 ENCOUNTER — Ambulatory Visit: Payer: 59 | Admitting: Family Medicine

## 2020-03-04 VITALS — BP 174/105 | HR 58 | Temp 97.6°F | Resp 14 | Ht 67.0 in | Wt 160.8 lb

## 2020-03-04 DIAGNOSIS — N529 Male erectile dysfunction, unspecified: Secondary | ICD-10-CM

## 2020-03-04 DIAGNOSIS — I10 Essential (primary) hypertension: Secondary | ICD-10-CM

## 2020-03-04 DIAGNOSIS — E785 Hyperlipidemia, unspecified: Secondary | ICD-10-CM | POA: Diagnosis not present

## 2020-03-04 DIAGNOSIS — R7303 Prediabetes: Secondary | ICD-10-CM | POA: Diagnosis not present

## 2020-03-04 MED ORDER — HYDROCHLOROTHIAZIDE 12.5 MG PO CAPS: 12.5000 mg | ORAL_CAPSULE | Freq: Every day | ORAL | 1 refills | Status: DC

## 2020-03-04 MED ORDER — SILDENAFIL CITRATE 50 MG PO TABS: 25.0000 mg | ORAL_TABLET | Freq: Every day | ORAL | 1 refills | Status: DC | PRN

## 2020-03-04 NOTE — Patient Instructions (Addendum)
  Continue amlodipine 2.5mg  every day. ADD hydrochlorothiazide once per day. (2 blood pressure meds total) Keep a record of your blood pressures outside of the office and call me or bring record with those readings in next 2 weeks. If blood pressure stabilizes under 140/90 - then can try low dose viagra. Use lowest effective dose.   Recheck in 1 month. Let me know if there are questions sooner.   If you have lab work done today you will be contacted with your lab results within the next 2 weeks.  If you have not heard from [place] then please contact us. The fastest way to get your results is to register for My Chart.   IF you received an x-ray today, you will receive an invoice from [facility]. Please contact [facility] at [phone] with questions or concerns regarding your invoice.   IF you received labwork today, you will receive an invoice from [place]. Please contact [facility] at [phone] with questions or concerns regarding your invoice.   Our billing staff will not be able to assist you with questions regarding bills from these companies.  You will be contacted with the lab results as soon as they are available. The fastest way to get your results is to activate your My Chart account. Instructions are located on the last page of this paperwork. If you have not heard from [place] regarding the results in 2 weeks, please contact this office.

## 2020-03-04 NOTE — Progress Notes (Signed)
Subjective:  Patient ID: Travis Huynh, male    DOB: [birth year]  Age: 54 y.o. MRN: [id number]  CC:  Chief Complaint  Patient presents with  . Hypertension    pt has been taking amlodapine daily for 2 weeks, denies physical symptoms over past 2 weeks     HPI [name] presents for   Hypertension: Follow-up from June 16 visit.  Blood pressure 136/100 at that time.  Reported possible side effects of 5 mg dosing in the past, initially started at 2.5 mg amlodipine. No new side effects on meds, no missed doses.  No chest pain.  Home readings: none since last visit.  Tired if standing all day.  BP Readings from Last 3 Encounters:  03/04/20 (!) 174/105  02/21/20 (!) 136/100  03/24/18 (!) 140/95   Lab Results  Component Value Date   CREATININE 1.01 02/21/2020   Erectile dysfunction: Discussed last visit.  Normal testosterone level of 267, free testosterone 15.9, drawn at 10:58 AM. Trouble both getting erection and maintaining erection. Decreased interest/libido.  Some anxiety about erections. No CP with exertion. Has taken viagra in past and tolerated.  Denies other anxiety or depression.   Depression screen [facility] 2/9 03/04/2020 02/21/2020  Decreased Interest 0 0  Down, Depressed, Hopeless 0 0  PHQ - 2 Score 0 0   No flowsheet data found.   Prediabetes: Lab Results  Component Value Date   HGBA1C 5.8 (H) 02/21/2020   Wt Readings from Last 3 Encounters:  03/04/20 160 lb 12.8 oz (72.9 kg)  02/21/20 157 lb (71.2 kg)  03/24/18 147 lb 9.6 oz (67 kg)    Hyperlipidemia: Slight elevation in labs June 16.  Not currently on medication  Lab Results  Component Value Date   CHOL 208 (H) 02/21/2020   HDL 51 02/21/2020   LDLCALC 134 (H) 02/21/2020   TRIG 131 02/21/2020   CHOLHDL 4.1 02/21/2020   Lab Results  Component Value Date   ALT 25 02/21/2020   AST 18 02/21/2020   ALKPHOS 75 02/21/2020   BILITOT 0.6 02/21/2020        History There are no problems to  display for this patient.  No past medical history on file. No past surgical history on file. No Known Allergies Prior to Admission medications   Medication Sig Start Date End Date Taking? Authorizing Provider  amLODipine (NORVASC) 2.5 MG tablet Take 1 tablet (2.5 mg total) by mouth daily. 02/21/20  Yes Wendie Agreste, MD  clobetasol (TEMOVATE) 0.05 % external solution Apply 1 application topically 2 (two) times daily. 01/12/20  Yes [provider]   Social History   Socioeconomic History  . Marital status: Unknown    Spouse name: Not on file  . Number of children: Not on file  . Years of education: Not on file  . Highest education level: Not on file  Occupational History  . Not on file  Tobacco Use  . Smoking status: Current Every Day Smoker    Packs/day: 0.25    Types: Cigarettes  . Smokeless tobacco: Never Used  Vaping Use  . Vaping Use: Never used  Substance and Sexual Activity  . Alcohol use: Yes    Alcohol/week: 1.0 standard drink    Types: 1 Cans of beer per week    Comment: 1-2 after work  . Drug use: Never  . Sexual activity: Yes  Other Topics Concern  . Not on file  Social History Narrative  . Not on file  Social Determinants of Health   Financial Resource Strain:   . Difficulty of Paying Living Expenses:   Food Insecurity:   . Worried About Charity fundraiser in the Last Year:   . Arboriculturist in the Last Year:   Transportation Needs:   . Film/video editor (Medical):   Marland Kitchen Lack of Transportation (Non-Medical):   Physical Activity:   . Days of Exercise per Week:   . Minutes of Exercise per Session:   Stress:   . Feeling of Stress :   Social Connections:   . Frequency of Communication with Friends and Family:   . Frequency of Social Gatherings with Friends and Family:   . Attends Religious Services:   . Active Member of Clubs or Organizations:   . Attends Archivist Meetings:   Marland Kitchen Marital Status:   Intimate Partner  Violence:   . Fear of Current or Ex-Partner:   . Emotionally Abused:   Marland Kitchen Physically Abused:   . Sexually Abused:     Review of Systems  Constitutional: Negative for fatigue and unexpected weight change.  Eyes: Negative for visual disturbance.  Respiratory: Negative for cough, chest tightness and shortness of breath.   Cardiovascular: Negative for chest pain, palpitations and leg swelling.  Gastrointestinal: Negative for abdominal pain and blood in stool.  Neurological: Negative for dizziness, light-headedness and headaches.     Objective:   Vitals:   03/04/20 1117  BP: (!) 174/105  Pulse: (!) 58  Resp: 14  Temp: 97.6 F (36.4 C)  TempSrc: Temporal  SpO2: 100%  Weight: 160 lb 12.8 oz (72.9 kg)  Height: 5\' 7"  (1.702 m)     Physical Exam Vitals reviewed.  Constitutional:      Appearance: He is well-developed.  HENT:     Head: Normocephalic and atraumatic.  Eyes:     Pupils: Pupils are equal, round, and reactive to light.  Neck:     Vascular: No carotid bruit or JVD.  Cardiovascular:     Rate and Rhythm: Normal rate and regular rhythm.     Heart sounds: Normal heart sounds. No murmur heard.   Pulmonary:     Effort: Pulmonary effort is normal.     Breath sounds: Normal breath sounds. No rales.  Skin:    General: Skin is warm and dry.  Neurological:     Mental Status: He is alert and oriented to person, place, and time.        Assessment & Plan:  Travis Huynh is a 54 y.o. male . Essential hypertension - Plan: hydrochlorothiazide (MICROZIDE) 12.5 MG capsule  - decreased control. Add hctz 12.5mg  qd. Continue amlodipine.   Erectile dysfunction, unspecified erectile dysfunction type - Plan: sildenafil (VIAGRA) 50 MG tablet  - viagra Rx given - use lowest effective dose, once blood pressure control improves. Side effects discussed (including but not limited to headache/flushing, blue discoloration of vision, possible vascular steal and risk of cardiac effects  if underlying unknown coronary artery disease, and permanent sensorineural hearing loss). Understanding expressed.  Hyperlipidemia, unspecified hyperlipidemia type  - initial diet/activity approach with repeat testing in 21months.   Prediabetes  - diet/activity as above with repeat testing in 6 months.   Meds ordered this encounter  Medications  . hydrochlorothiazide (MICROZIDE) 12.5 MG capsule    Sig: Take 1 capsule (12.5 mg total) by mouth daily.    Dispense:  90 capsule    Refill:  1  . sildenafil (VIAGRA) 50 MG tablet  Sig: Take 0.5-1 tablets (25-50 mg total) by mouth daily as needed for erectile dysfunction.    Dispense:  10 tablet    Refill:  1   Patient Instructions    Continue amlodipine 2.5mg  every day. ADD hydrochlorothiazide once per day. (2 blood pressure meds total) Keep a record of your blood pressures outside of the office and call me or bring record with those readings in next 2 weeks. If blood pressure stabilizes under 140/90 - then can try low dose viagra. Use lowest effective dose.   Recheck in 1 month. Let me know if there are questions sooner.   If you have lab work done today you will be contacted with your lab results within the next 2 weeks.  If you have not heard from [place] then please contact us. The fastest way to get your results is to register for My Chart.   IF you received an x-ray today, you will receive an invoice from [facility]. Please contact [facility] at [phone] with questions or concerns regarding your invoice.   IF you received labwork today, you will receive an invoice from [place]. Please contact [facility] at [phone] with questions or concerns regarding your invoice.   Our billing staff will not be able to assist you with questions regarding bills from these companies.  You will be contacted with the lab results as soon as they are available. The fastest way to get your results is to activate your My Chart  account. Instructions are located on the last page of this paperwork. If you have not heard from [place] regarding the results in 2 weeks, please contact this office.         Signed, Merri Ray, MD [facility]

## 2020-03-05 ENCOUNTER — Encounter: Payer: Self-pay | Admitting: Family Medicine

## 2020-03-14 ENCOUNTER — Telehealth: Payer: Self-pay | Admitting: Family Medicine

## 2020-03-14 DIAGNOSIS — L639 Alopecia areata, unspecified: Secondary | ICD-10-CM | POA: Diagnosis not present

## 2020-03-14 MED FILL — CLOBETASOL 0.05% SOLUTION: 0.05 | 14 days supply | Qty: 50 | Fill #0

## 2020-03-14 NOTE — Telephone Encounter (Signed)
Lab results has been faxed to Dermatology as of 03/14/20 @ 10:27

## 2020-03-14 NOTE — Telephone Encounter (Signed)
Pt is calling from his Dermatology office he is there now , and is needing recent labs faxed over for him/  Phone number to dermatology office is  [phone] and please fax  to [phone]

## 2020-03-15 ENCOUNTER — Telehealth: Payer: Self-pay

## 2020-03-15 NOTE — Telephone Encounter (Signed)
Copied from [place] [id number]. Topic: General - Other >> Feb 29, 2020 12:13 PM Leward Quan A wrote: Reason for CRM: Patient called to inquire of who it was that called him he can be reached at Ph# [phone]

## 2020-03-27 ENCOUNTER — Ambulatory Visit (AMBULATORY_SURGERY_CENTER): Payer: Self-pay | Admitting: *Deleted

## 2020-03-27 ENCOUNTER — Other Ambulatory Visit: Payer: Self-pay

## 2020-03-27 VITALS — Ht 67.0 in | Wt 158.0 lb

## 2020-03-27 DIAGNOSIS — Z1211 Encounter for screening for malignant neoplasm of colon: Secondary | ICD-10-CM

## 2020-03-27 MED ORDER — NA SULFATE-K SULFATE-MG SULF 17.5-3.13-1.6 GM/177ML PO SOLN: 1.0000 | Freq: Once | ORAL | 0 refills | Status: AC

## 2020-03-27 MED FILL — SUPREP BOWEL PREP KIT: 17.5-3.13-1 | 1 days supply | Qty: 354 | Fill #0

## 2020-03-27 NOTE — Progress Notes (Signed)
No egg or soy allergy known to patient  No issues with past sedation with any surgeries or procedures no intubation problems in the past  No diet pills per patient No home 02 use per patient  No blood thinners per patient  Pt denies issues with constipation  No A fib or A flutter  EMMI video to pt or MyChart  COVID 19 guidelines implemented in [place] today   Due to the COVID-19 pandemic we are asking patients to follow these guidelines. Please only bring one care partner. Please be aware that your care partner may wait in the car in the parking lot or if they feel like they will be too hot to wait in the car, they may wait in the lobby on the 4th floor. All care partners are required to wear a mask the entire time (we do not have any that we can provide them), they need to practice social distancing, and we will do a Covid check for all patient's and care partners when you arrive. Also we will check their temperature and your temperature. If the care partner waits in their car they need to stay in the parking lot the entire time and we will call them on their cell phone when the patient is ready for discharge so they can bring the car to the front of the building. Also all patient's will need to wear a mask into building.  

## 2020-03-28 ENCOUNTER — Encounter: Payer: Self-pay | Admitting: Gastroenterology

## 2020-03-28 MED FILL — CLOBETASOL 0.05% SOLUTION: 0.05 | 14 days supply | Qty: 50 | Fill #0

## 2020-04-01 ENCOUNTER — Encounter: Payer: Self-pay | Admitting: Gastroenterology

## 2020-04-08 ENCOUNTER — Ambulatory Visit: Payer: 59 | Admitting: Family Medicine

## 2020-04-10 ENCOUNTER — Encounter: Payer: Self-pay | Admitting: Gastroenterology

## 2020-04-10 ENCOUNTER — Ambulatory Visit (AMBULATORY_SURGERY_CENTER): Payer: 59 | Admitting: Gastroenterology

## 2020-04-10 ENCOUNTER — Other Ambulatory Visit: Payer: Self-pay

## 2020-04-10 VITALS — BP 105/66 | HR 64 | Temp 97.2°F | Resp 12 | Ht 67.0 in | Wt 158.0 lb

## 2020-04-10 DIAGNOSIS — K635 Polyp of colon: Secondary | ICD-10-CM

## 2020-04-10 DIAGNOSIS — D122 Benign neoplasm of ascending colon: Secondary | ICD-10-CM

## 2020-04-10 DIAGNOSIS — Z1211 Encounter for screening for malignant neoplasm of colon: Secondary | ICD-10-CM

## 2020-04-10 DIAGNOSIS — D121 Benign neoplasm of appendix: Secondary | ICD-10-CM

## 2020-04-10 DIAGNOSIS — K388 Other specified diseases of appendix: Secondary | ICD-10-CM

## 2020-04-10 MED ORDER — SODIUM CHLORIDE 0.9 % IV SOLN: 500.0000 mL | Freq: Once | INTRAVENOUS | Status: DC

## 2020-04-10 NOTE — Progress Notes (Signed)
Vitals-WR  Pt's states no medical or surgical changes since previsit or office visit.  Called to room to assist during endoscopic procedure.  Patient ID and intended procedure confirmed with present staff. Received instructions for my participation in the procedure from the performing physician.

## 2020-04-10 NOTE — Patient Instructions (Addendum)
Handouts on polyps  given to you today   Await pathology results on polyps removed today   YOU HAD AN ENDOSCOPIC PROCEDURE TODAY AT [facility]:   Refer to the procedure report that was given to you for any specific questions about what was found during the examination.  If the procedure report does not answer your questions, please call your gastroenterologist to clarify.  If you requested that your care partner not be given the details of your procedure findings, then the procedure report has been included in a sealed envelope for you to review at your convenience later.  YOU SHOULD EXPECT: Some feelings of bloating in the abdomen. Passage of more gas than usual.  Walking can help get rid of the air that was put into your GI tract during the procedure and reduce the bloating. If you had a lower endoscopy (such as a colonoscopy or flexible sigmoidoscopy) you may notice spotting of blood in your stool or on the toilet paper. If you underwent a bowel prep for your procedure, you may not have a normal bowel movement for a few days.  Please Note:  You might notice some irritation and congestion in your nose or some drainage.  This is from the oxygen used during your procedure.  There is no need for concern and it should clear up in a day or so.  SYMPTOMS TO REPORT IMMEDIATELY:   Following lower endoscopy (colonoscopy or flexible sigmoidoscopy):  Excessive amounts of blood in the stool  Significant tenderness or worsening of abdominal pains  Swelling of the abdomen that is new, acute  Fever of 100F or higher    For urgent or emergent issues, a gastroenterologist can be reached at any hour by calling [phone]. Do not use MyChart messaging for urgent concerns.    DIET:  We do recommend a small meal at first, but then you may proceed to your regular diet.  Drink plenty of fluids but you should avoid alcoholic beverages for 24 hours.  ACTIVITY:  You should plan to take  it easy for the rest of today and you should NOT DRIVE or use heavy machinery until tomorrow (because of the sedation medicines used during the test).    FOLLOW UP: Our staff will call the number listed on your records 48-72 hours following your procedure to check on you and address any questions or concerns that you may have regarding the information given to you following your procedure. If we do not reach you, we will leave a message.  We will attempt to reach you two times.  During this call, we will ask if you have developed any symptoms of COVID 19. If you develop any symptoms (ie: fever, flu-like symptoms, shortness of breath, cough etc.) before then, please call [phone].  If you test positive for Covid 19 in the 2 weeks post procedure, please call and report this information to [place].    If any biopsies were taken you will be contacted by phone or by letter within the next 1-3 weeks.  Please call us at [phone] if you have not heard about the biopsies in 3 weeks.    SIGNATURES/CONFIDENTIALITY: You and/or your care partner have signed paperwork which will be entered into your electronic medical record.  These signatures attest to the fact that that the information above on your After Visit Summary has been reviewed and is understood.  Full responsibility of the confidentiality of this discharge information lies with you  and/or your care-partner. 

## 2020-04-10 NOTE — Op Note (Signed)
[facility] Patient Name: Travis Huynh Procedure Date: 04/10/2020 1:20 PM MRN: [id number] Endoscopist: Mallie Mussel L. Loletha Carrow , MD Age: 54 Referring MD:  Date of Birth: 1965-10-21 Gender: Male Account #: [id number] Procedure:                Colonoscopy Indications:              Screening for colorectal malignant neoplasm, This                            is the patient's first colonoscopy Medicines:                Monitored Anesthesia Care Procedure:                Pre-Anesthesia Assessment:                           - Prior to the procedure, a History and Physical                            was performed, and patient medications and                            allergies were reviewed. The patient's tolerance of                            previous anesthesia was also reviewed. The risks                            and benefits of the procedure and the sedation                            options and risks were discussed with the patient.                            All questions were answered, and informed consent                            was obtained. Prior Anticoagulants: The patient has                            taken no previous anticoagulant or antiplatelet                            agents. ASA Grade Assessment: II - A patient with                            mild systemic disease. After reviewing the risks                            and benefits, the patient was deemed in                            satisfactory condition to undergo the procedure.  After obtaining informed consent, the colonoscope                            was passed under direct vision. Throughout the                            procedure, the patient's blood pressure, pulse, and                            oxygen saturations were monitored continuously. The                            Colonoscope was introduced through the anus and                            advanced to the the cecum,  identified by                            appendiceal orifice and ileocecal valve. The                            colonoscopy was performed without difficulty. The                            patient tolerated the procedure well. The quality                            of the bowel preparation was fair. The ileocecal                            valve, appendiceal orifice, and rectum were                            photographed. The bowel preparation used was                            Miralax. Scope In: 1:32:09 PM Scope Out: 1:55:38 PM Scope Withdrawal Time: 0 hours 21 minutes 45 seconds  Total Procedure Duration: 0 hours 23 minutes 29 seconds  Findings:                 The perianal and digital rectal examinations were                            normal.                           A 5 mm polyp was found in the appendiceal orifice.                            It was not initially evident, but then became                            intermittently visible with peristalsis. The polyp  was sessile, and was visualized with both WL and                            NBI. The polyp was removed with a cold snare.                            Resection and retrieval were complete.                           A 4 mm polyp was found in the descending colon. The                            polyp was sessile. The polyp was removed with a                            cold snare. Resection and retrieval were complete.                           Retroflexion in the rectum was not performed due to                            anatomy.                           The exam was otherwise without abnormality. Complications:            No immediate complications. Estimated Blood Loss:     Estimated blood loss was minimal. Impression:               - Preparation of the colon was fair.                           - One 5 mm polyp at the appendiceal orifice,                            removed with a cold snare.  Resected and retrieved.                           - One 4 mm polyp in the descending colon, removed                            with a cold snare. Resected and retrieved.                           - The examination was otherwise normal. Recommendation:           - Patient has a contact number available for                            emergencies. The signs and symptoms of potential                            delayed complications were discussed with the  patient. Return to normal activities tomorrow.                            Written discharge instructions were provided to the                            patient.                           - Resume previous diet.                           - Continue present medications.                           - Await pathology results.                           - Repeat colonoscopy in 1 year for surveillance                            (fair prep). Suprep or Plenvu for next exam. Mallie Mussel L. Loletha Carrow, MD 04/10/2020 2:05:10 PM This report has been signed electronically.

## 2020-04-10 NOTE — Progress Notes (Signed)
pt tolerated well. VSS. awake and to recovery. Report given to RN.  

## 2020-04-12 ENCOUNTER — Telehealth: Payer: Self-pay | Admitting: *Deleted

## 2020-04-12 NOTE — Telephone Encounter (Signed)
  Follow up Call-  Call back number 04/10/2020  Post procedure Call Back phone  # [phone]  Permission to leave phone message Yes     Patient questions:  Do you have a fever, pain , or abdominal swelling? No. Pain Score  0 *  Have you tolerated food without any problems? Yes.    Have you been able to return to your normal activities? Yes.    Do you have any questions about your discharge instructions: Diet   No. Medications  No. Follow up visit  No.  Do you have questions or concerns about your Care? No.  Actions: * If pain score is 4 or above: No action needed, pain <4.  1. Have you developed a fever since your procedure? no  2.   Have you had an respiratory symptoms (SOB or cough) since your procedure? no  3.   Have you tested positive for COVID 19 since your procedure no  4.   Have you had any family members/close contacts diagnosed with the COVID 19 since your procedure? no   If yes to any of these questions please route to Joylene John, RN and Erenest Rasher, RN

## 2020-04-16 ENCOUNTER — Encounter: Payer: Self-pay | Admitting: Gastroenterology

## 2020-09-05 ENCOUNTER — Encounter: Payer: Self-pay | Admitting: *Deleted

## 2020-09-05 ENCOUNTER — Emergency Department
Admission: EM | Admit: 2020-09-05 | Discharge: 2020-09-05 | Disposition: A | Discharge status: Home / Self Care | Payer: 59 | Attending: Emergency Medicine | Admitting: Emergency Medicine

## 2020-09-05 ENCOUNTER — Other Ambulatory Visit: Payer: Self-pay

## 2020-09-05 ENCOUNTER — Emergency Department: Payer: 59

## 2020-09-05 DIAGNOSIS — R002 Palpitations: Secondary | ICD-10-CM | POA: Diagnosis not present

## 2020-09-05 DIAGNOSIS — F17210 Nicotine dependence, cigarettes, uncomplicated: Secondary | ICD-10-CM | POA: Insufficient documentation

## 2020-09-05 DIAGNOSIS — I10 Essential (primary) hypertension: Secondary | ICD-10-CM | POA: Insufficient documentation

## 2020-09-05 DIAGNOSIS — Z79899 Other long term (current) drug therapy: Secondary | ICD-10-CM | POA: Insufficient documentation

## 2020-09-05 LAB — BASIC METABOLIC PANEL
Anion gap: 12 (ref 5–15)
BUN: 16 mg/dL (ref 6–20)
CO2: 21 mmol/L — ABNORMAL LOW (ref 22–32)
Calcium: 9.2 mg/dL (ref 8.9–10.3)
Chloride: 106 mmol/L (ref 98–111)
Creatinine, Ser: 0.91 mg/dL (ref 0.61–1.24)
GFR, Estimated: >60 mL/min (ref >60)
Glucose, Bld: 105 mg/dL — ABNORMAL HIGH (ref 70–99)
Potassium: 3.3 mmol/L — ABNORMAL LOW (ref 3.5–5.1)
Sodium: 139 mmol/L (ref 135–145)

## 2020-09-05 LAB — CBC
HCT: 40.2 % (ref 39.0–52.0)
Hemoglobin: 13.4 g/dL (ref 13.0–17.0)
MCH: 28.9 pg (ref 26.0–34.0)
MCHC: 33.3 g/dL (ref 30.0–36.0)
MCV: 86.6 fL (ref 80.0–100.0)
Platelets: 196 10*3/uL (ref 150–400)
RBC: 4.64 MIL/uL (ref 4.22–5.81)
RDW: 14.7 % (ref 11.5–15.5)
WBC: 6.3 10*3/uL (ref 4.0–10.5)
nRBC: 0 % (ref 0.0–0.2)

## 2020-09-05 LAB — TROPONIN I (HIGH SENSITIVITY)
Troponin I (High Sensitivity): 5 ng/L (ref <18)
Troponin I (High Sensitivity): 6 ng/L (ref <18)

## 2020-09-05 MED ORDER — LORAZEPAM 0.5 MG PO TABS: 0.5000 mg | ORAL_TABLET | Freq: Every day | ORAL | 0 refills | Status: DC | PRN

## 2020-09-05 MED FILL — LORazepam 0.5 MG TABS: 0.5 | 15 days supply | Qty: 15 | Fill #0

## 2020-09-05 NOTE — ED Provider Notes (Addendum)
[facility] Emergency Department Provider Note  Time seen: 11:28 AM  I have reviewed the triage vital signs and the nursing notes.   HISTORY  Chief Complaint Palpitations   HPI Travis Huynh is a 54 y.o. male with a past medical history of hypertension presents to the emergency department for palpitations.  Patient states last night while getting ready he began experiencing palpitations which he describes as feeling like his heart beat very hard in his chest.  Denies feeling any skipped beats shortness of breath or chest pain.  Patient states he lied down and try to calm down but continued to feel the heart beat hard in his chest so he decided to come in to be evaluated.  Patient denies any palpitations currently.  Patient states he has not taken his morning blood pressure medication.  Patient does admit stress/anxiety due to the palpitations as well as family issues.  Denies any shortness of breath cough fever nausea or diaphoresis.  Past Medical History:  Diagnosis Date  . Hypertension     There are no problems to display for this patient.   Past Surgical History:  Procedure Laterality Date  . MOUTH SURGERY      Prior to Admission medications   Medication Sig Start Date End Date Taking? Authorizing Provider  amLODipine (NORVASC) 2.5 MG tablet Take 1 tablet (2.5 mg total) by mouth daily. 02/21/20   Shade Flood, MD  clobetasol (TEMOVATE) 0.05 % external solution Apply 1 application topically 2 (two) times daily. 01/12/20   [provider]  hydrochlorothiazide (MICROZIDE) 12.5 MG capsule Take 1 capsule (12.5 mg total) by mouth daily. 03/04/20   Shade Flood, MD  sildenafil (VIAGRA) 50 MG tablet Take 0.5-1 tablets (25-50 mg total) by mouth daily as needed for erectile dysfunction. Patient not taking: Reported on 04/10/2020 03/04/20   Shade Flood, MD    No Known Allergies  Family History  Problem Relation Age of Onset  . Prostate  cancer Maternal Uncle   . Colon cancer Neg Hx   . Colon polyps Neg Hx   . Esophageal cancer Neg Hx   . Stomach cancer Neg Hx     Social History Social History   Tobacco Use  . Smoking status: Current Every Day Smoker    Packs/day: 0.25    Types: Cigarettes  . Smokeless tobacco: Never Used  Vaping Use  . Vaping Use: Never used  Substance Use Topics  . Alcohol use: Yes    Alcohol/week: 1.0 standard drink    Types: 1 Cans of beer per week    Comment: occasionally  . Drug use: Never    Review of Systems Constitutional: Negative for fever. Cardiovascular: Negative for chest pain.  Positive for palpitations. Respiratory: Negative for shortness of breath. Gastrointestinal: Negative for abdominal pain, vomiting  Musculoskeletal: Negative for musculoskeletal complaints Neurological: Negative for headache All other ROS negative  ____________________________________________   PHYSICAL EXAM:  VITAL SIGNS: ED Triage Vitals  Enc Vitals Group     BP 09/05/20 0302 (!) 182/110     Pulse Rate 09/05/20 0302 78     Resp 09/05/20 0302 16     Temp 09/05/20 0302 98.6 F (37 C)     Temp Source 09/05/20 0302 Oral     SpO2 09/05/20 0302 97 %     Weight --      Height --      Head Circumference --      Peak Flow --  Pain Score 09/05/20 0258 9     Pain Loc --      Pain Edu? --      Excl. in GC? --    Constitutional: Alert and oriented. Well appearing and in no distress. Eyes: Normal exam ENT      Head: Normocephalic and atraumatic.      Mouth/Throat: Mucous membranes are moist. Cardiovascular: Normal rate, regular rhythm.  Respiratory: Normal respiratory effort without tachypnea nor retractions. Breath sounds are clear  Gastrointestinal: Soft and nontender. No distention.   Musculoskeletal: Nontender with normal range of motion in all extremities.  Neurologic:  Normal speech and language. No gross focal neurologic deficits Skin:  Skin is warm, dry and intact.   Psychiatric: Mood and affect are normal.   ____________________________________________    EKG  EKG viewed and interpreted by myself shows normal sinus rhythm at 77 bpm with a narrow QRS, left axis deviation, largely normal intervals, no concerning ST changes  ____________________________________________    RADIOLOGY  Chest x-ray is normal  ____________________________________________   INITIAL IMPRESSION / ASSESSMENT AND PLAN / ED COURSE  Pertinent labs & imaging results that were available during my care of the patient were reviewed by me and considered in my medical decision making (see chart for details).   Patient presents emergency department for palpitations last night.  Overall the patient appears well, no distress.  No chest pain shortness of breath largely negative review of systems.  Patient's lab work is reassuring including negative troponin x2.  EKG is reassuring and chest x-ray is clear.  Patient is somewhat hypertensive here has not taken his morning medications.  States anxiety/stress as well.  I discussed a short course of Ativan to be used only if needed.  Discussed not drinking alcohol or driving.  Patient agreeable to plan of care.  Patient is to follow-up with his primary care doctor regarding his blood pressure.  Discussed return precautions for chest pain, shortness of breath.  Travis Huynh was evaluated in Emergency Department on 09/05/2020 for the symptoms described in the history of present illness. He was evaluated in the context of the global COVID-19 pandemic, which necessitated consideration that the patient might be at risk for infection with the SARS-CoV-2 virus that causes COVID-19. Institutional protocols and algorithms that pertain to the evaluation of patients at risk for COVID-19 are in a state of rapid change based on information released by regulatory bodies including the CDC and federal and state organizations. These policies and algorithms were  followed during the patient's care in the ED.  ____________________________________________   FINAL CLINICAL IMPRESSION(S) / ED DIAGNOSES  Palpitations   Minna Antis, MD 09/05/20 1132    Minna Antis, MD 09/05/20 [phone]

## 2020-09-05 NOTE — ED Triage Notes (Signed)
Pt says he was lying down and started feeling like his heart was racing, took an ibuprofen and then started having left arm pain.

## 2020-10-28 ENCOUNTER — Telehealth: Payer: Self-pay | Admitting: Family Medicine

## 2020-10-28 NOTE — Telephone Encounter (Signed)
Patient's spouse called to schedule an appointment with the provider; patient having an ongoing issue with heart palpitations for 1 month (seen in ER 1 month ago for this issue).   Patient has recently developed a heavy feeling in the arm brought on by heart palpitations. Advised patient to go to the ER or an urgent care center. Patient's spouse scheduled patient for an in-office visit for 2/24.

## 2020-10-31 ENCOUNTER — Encounter: Payer: Self-pay | Admitting: Family Medicine

## 2020-10-31 ENCOUNTER — Other Ambulatory Visit: Payer: Self-pay

## 2020-10-31 ENCOUNTER — Ambulatory Visit: Payer: 59 | Admitting: Family Medicine

## 2020-10-31 ENCOUNTER — Other Ambulatory Visit: Payer: Self-pay | Admitting: Family Medicine

## 2020-10-31 VITALS — BP 157/93 | HR 63 | Temp 98.0°F | Ht 67.0 in | Wt 154.8 lb

## 2020-10-31 DIAGNOSIS — I10 Essential (primary) hypertension: Secondary | ICD-10-CM

## 2020-10-31 DIAGNOSIS — R002 Palpitations: Secondary | ICD-10-CM | POA: Diagnosis not present

## 2020-10-31 DIAGNOSIS — N529 Male erectile dysfunction, unspecified: Secondary | ICD-10-CM

## 2020-10-31 MED ORDER — AMLODIPINE BESYLATE 2.5 MG PO TABS
2.5000 mg | ORAL_TABLET | Freq: Every day | ORAL | 1 refills | Status: DC
Start: 2020-10-31 — End: 2020-11-04

## 2020-10-31 MED ORDER — SILDENAFIL CITRATE 50 MG PO TABS: 25.0000 mg | ORAL_TABLET | Freq: Every day | ORAL | 1 refills | Status: DC | PRN

## 2020-10-31 MED ORDER — HYDROCHLOROTHIAZIDE 12.5 MG PO CAPS: 12.5000 mg | ORAL_CAPSULE | Freq: Every day | ORAL | 1 refills | Status: DC

## 2020-10-31 MED ORDER — METOPROLOL TARTRATE 25 MG PO TABS: 12.5000 mg | ORAL_TABLET | Freq: Two times a day (BID) | ORAL | 0 refills | Status: DC

## 2020-10-31 MED FILL — METOPROLOL TARTRATE 25 MG T: 25 | 90 days supply | Qty: 90 | Fill #0

## 2020-10-31 MED FILL — AMLODIPINE BESYLATE 2.5 MG: 2.5 | 90 days supply | Qty: 90 | Fill #0

## 2020-10-31 MED FILL — HYDROCHLOROTHIAZIDE 12.5 MG: 12.5 | 90 days supply | Qty: 90 | Fill #0

## 2020-10-31 MED FILL — SILDENAFIL CITRATE 50 MG TA: 50 | 30 days supply | Qty: 6 | Fill #0

## 2020-10-31 NOTE — Patient Instructions (Addendum)
   Palpitations Palpitations are feelings that your heartbeat is not normal. Your heartbeat may feel like it is:  Uneven.  Faster than normal.  Fluttering.  Skipping a beat. This is usually not a serious problem. In some cases, you may need tests to rule out any serious problems. Follow these instructions at home: Pay attention to any changes in your condition. Take these actions to help manage your symptoms: Eating and drinking  Avoid: ? Coffee, tea, soft drinks, and energy drinks. ? Chocolate. ? Alcohol. ? Diet pills. Lifestyle  Try to lower your stress. These things can help you relax: ? Yoga. ? Deep breathing and meditation. ? Exercise. ? Using words and images to create positive thoughts (guided imagery). ? Using your mind to control things in your body (biofeedback).  Do not use drugs.  Get plenty of rest and sleep. Keep a regular bed time.   General instructions  Take over-the-counter and prescription medicines only as told by your doctor.  Do not use any products that contain nicotine or tobacco, such as cigarettes and e-cigarettes. If you need help quitting, ask your doctor.  Keep all follow-up visits as told by your doctor. This is important. You may need more tests if palpitations do not go away or get worse.   Contact a doctor if:  Your symptoms last more than 24 hours.  Your symptoms occur more often. Get help right away if you:  Have chest pain.  Feel short of breath.  Have a very bad headache.  Feel dizzy.  Pass out (faint). Summary  Palpitations are feelings that your heartbeat is uneven or faster than normal. It may feel like your heart is fluttering or skipping a beat.  Avoid food and drinks that may cause palpitations. These include caffeine, chocolate, and alcohol.  Try to lower your stress. Do not smoke or use drugs.  Get help right away if you faint or have chest pain, shortness of breath, a severe headache, or dizziness. This  information is not intended to replace advice given to you by your health care provider. Make sure you discuss any questions you have with your health care provider. Document Revised: 10/06/2017 Document Reviewed: 10/06/2017 Elsevier Patient Education  2021 [employer].    If you have lab work done today you will be contacted with your lab results within the next 2 weeks.  If you have not heard from [place] then please contact us. The fastest way to get your results is to register for My Chart.   IF you received an x-ray today, you will receive an invoice from [facility]. Please contact [facility] at [phone] with questions or concerns regarding your invoice.   IF you received labwork today, you will receive an invoice from [facility]. Please contact [facility] at [phone] with questions or concerns regarding your invoice.   Our billing staff will not be able to assist you with questions regarding bills from these companies.  You will be contacted with the lab results as soon as they are available. The fastest way to get your results is to activate your My Chart account. Instructions are located on the last page of this paperwork. If you have not heard from [place] regarding the results in 2 weeks, please contact this office.

## 2020-10-31 NOTE — Progress Notes (Signed)
2/24/20229:23 AM  Travis Huynh [birth year], 55 y.o., male [id number]  Chief Complaint  Patient presents with  . Palpitations    HPI:   Patient is a 55 y.o. male with past medical history significant HTN, anxiety for who presents today for palpations.   Seen in the ED 12/21 for palpitations Has been going on for a month and a half Happens most days Has never seen cardiology before When having palpitations can be at work or rest Has SOB and anxiety with palpitations, but not at rest Lasts approx 2 min and happens 3-4 times per day The Lorazepam given by the ED did not help to prevent episodes Denies episodes of dizziness, syncope, chest pain   HTN Amlodipine 2.5 HCTZ 12.5 Takes BP at home daily 140/x at home BP Readings from Last 3 Encounters:  10/31/20 (!) 157/93  09/05/20 (!) 170/77  04/10/20 105/66     Depression screen PHQ 2/9 10/31/2020 03/04/2020 02/21/2020  Decreased Interest 0 0 0  Down, Depressed, Hopeless 0 0 0  PHQ - 2 Score 0 0 0    Fall Risk  10/31/2020 03/04/2020 02/21/2020  Falls in the past year? 0 0 0  Number falls in past yr: 0 - -  Injury with Fall? 0 - -  Follow up Falls evaluation completed Falls evaluation completed Falls evaluation completed     No Known Allergies  Prior to Admission medications   Medication Sig Start Date End Date Taking? Authorizing Provider  amLODipine (NORVASC) 2.5 MG tablet Take 1 tablet (2.5 mg total) by mouth daily. 02/21/20  Yes Travis Agreste, MD  hydrochlorothiazide (MICROZIDE) 12.5 MG capsule Take 1 capsule (12.5 mg total) by mouth daily. 03/04/20  Yes Travis Agreste, MD  LORazepam (ATIVAN) 0.5 MG tablet Take 0.5 mg by mouth every 8 (eight) hours.   Yes [provider]  sildenafil (VIAGRA) 50 MG tablet Take 0.5-1 tablets (25-50 mg total) by mouth daily as needed for erectile dysfunction. Patient not taking: Reported on 10/31/2020 03/04/20   Travis Agreste, MD    Past Medical History:   Diagnosis Date  . Hypertension     Past Surgical History:  Procedure Laterality Date  . MOUTH SURGERY      Social History   Tobacco Use  . Smoking status: Current Every Day Smoker    Packs/day: 0.25    Types: Cigarettes  . Smokeless tobacco: Never Used  Substance Use Topics  . Alcohol use: Yes    Alcohol/week: 1.0 standard drink    Types: 1 Cans of beer per week    Comment: occasionally    Family History  Problem Relation Age of Onset  . Prostate cancer Maternal Uncle   . Colon cancer Neg Hx   . Colon polyps Neg Hx   . Esophageal cancer Neg Hx   . Stomach cancer Neg Hx     Review of Systems  Constitutional: Negative for chills, fever and malaise/fatigue.  Eyes: Negative for blurred vision and double vision.  Respiratory: Positive for shortness of breath (only when having palpitations). Negative for cough and wheezing.   Cardiovascular: Positive for palpitations. Negative for chest pain and leg swelling.  Gastrointestinal: Negative for heartburn.  Musculoskeletal: Negative for back pain and joint pain.  Neurological: Negative for dizziness, weakness and headaches.  Psychiatric/Behavioral: The patient is nervous/anxious (When having palpitations).      OBJECTIVE:  Today's Vitals   10/31/20 0814  BP: (!) 157/93  Pulse: 63  Temp: 98 F (36.7  C)  TempSrc: Temporal  SpO2: 99%  Weight: 154 lb 12.8 oz (70.2 kg)  Height: $Remove'5\' 7"'BCwZMmB$  (1.702 m)   Body mass index is 24.25 kg/m.   Physical Exam Vitals reviewed.  Constitutional:      Appearance: Normal appearance.  HENT:     Head: Normocephalic and atraumatic.  Eyes:     Conjunctiva/sclera: Conjunctivae normal.     Pupils: Pupils are equal, round, and reactive to light.  Cardiovascular:     Rate and Rhythm: Normal rate and regular rhythm.     Pulses: Normal pulses.     Heart sounds: Normal heart sounds. No murmur heard. No friction rub. No gallop.   Pulmonary:     Effort: Pulmonary effort is normal. No  respiratory distress.     Breath sounds: Normal breath sounds. No stridor. No wheezing or rales.  Abdominal:     General: Bowel sounds are normal.     Palpations: Abdomen is soft.     Tenderness: There is no abdominal tenderness.  Musculoskeletal:     Right lower leg: No edema.     Left lower leg: No edema.  Skin:    General: Skin is warm and dry.  Neurological:     General: No focal deficit present.     Mental Status: He is alert and oriented to person, place, and time.  Psychiatric:        Mood and Affect: Mood normal.        Behavior: Behavior normal.     No results found for this or any previous visit (from the past 24 hour(s)).  No results found.   ASSESSMENT and PLAN  Problem List Items Addressed This Visit   None   Visit Diagnoses    Palpitations    -  Primary   Relevant Medications   metoprolol tartrate (LOPRESSOR) 25 MG tablet   Other Relevant Orders   EKG 12-Lead (Completed)   Ambulatory referral to Cardiology   CMP14+EGFR   Magnesium   Essential hypertension       Relevant Medications   metoprolol tartrate (LOPRESSOR) 25 MG tablet   hydrochlorothiazide (MICROZIDE) 12.5 MG capsule   amLODipine (NORVASC) 2.5 MG tablet   sildenafil (VIAGRA) 50 MG tablet   Erectile dysfunction, unspecified erectile dysfunction type       Relevant Medications   sildenafil (VIAGRA) 50 MG tablet      Plan . EKG: SB rate 56, pr 13, QT 43, QRS 94, No changes from prior EKG . Medication refills sent . Starting Metoprolol 12.5 mg bid . Encouraged to keep a close eye on BP . Will follow up on lab results . Referral placed to Cardiology   Return in about 2 weeks (around 11/14/2020).    Travis Huynh Travis Fishburn, FNP-BC [facility], [place] [address] Ph.  [phone] Fax [phone]

## 2020-11-01 LAB — CMP14+EGFR
ALT: 24 IU/L (ref 0–44)
AST: 17 IU/L (ref 0–40)
Albumin/Globulin Ratio: 1.7 (ref 1.2–2.2)
Albumin: 4.5 g/dL (ref 3.8–4.9)
Alkaline Phosphatase: 75 IU/L (ref 44–121)
BUN/Creatinine Ratio: 17 (ref 9–20)
BUN: 18 mg/dL (ref 6–24)
Bilirubin Total: 0.2 mg/dL (ref 0.0–1.2)
CO2: 22 mmol/L (ref 20–29)
Calcium: 10 mg/dL (ref 8.7–10.2)
Chloride: 105 mmol/L (ref 96–106)
Creatinine, Ser: 1.04 mg/dL (ref 0.76–1.27)
GFR calc Af Amer: 93 mL/min/{1.73_m2} (ref >59)
GFR calc non Af Amer: 80 mL/min/{1.73_m2} (ref >59)
Globulin, Total: 2.6 g/dL (ref 1.5–4.5)
Glucose: 94 mg/dL (ref 65–99)
Potassium: 4.1 mmol/L (ref 3.5–5.2)
Sodium: 140 mmol/L (ref 134–144)
Total Protein: 7.1 g/dL (ref 6.0–8.5)

## 2020-11-01 LAB — MAGNESIUM: Magnesium: 1.8 mg/dL (ref 1.6–2.3)

## 2020-11-02 NOTE — Progress Notes (Signed)
Cardiology Office Note:    Date:  11/04/2020   ID:  Travis Huynh, DOB 09/26/1965, MRN [id number]  PCP:  Wendie Agreste, MD   [place]  Cardiologist:  No primary care provider on file.  Advanced Practice Provider:  No care team member to display Electrophysiologist:  None   Referring MD: Just, Laurita Quint, FNP     History of Present Illness:    Travis Huynh is a 55 y.o. male with a hx of HTN and anxiety who was referred by Huston Foley Just, FNP for further evaluation of palpitations.  Patient presented to the ED on 08/2020 for palpitations that had been ongoing for the last month and a half. Can occur with rest or exertion. Each episode lasts approximately 68min and occurs about 3-4x/day. Was given ativan by the ED to help manage symptoms.   Today, the patient states that he has been having frequent sensations of his heart racing and pounding with associated left arm heaviness and SOB. Occurs every other day. Usually associated with stress and improves with calming down. Not exertional in nature. Symptoms can wake him up at night. Lasts a couple of minutes before abating or deep breathing. No lightheadness, dizziness, fainting, LE edema, orthopnea or PND. No known thyroid issues. Blood pressures running 150s at home.   Family history notable for mother with CAD at age 58. No known arrhythmias or SCD in the family.  Past Medical History:  Diagnosis Date  . Hypertension   . Palpitations     Past Surgical History:  Procedure Laterality Date  . MOUTH SURGERY      Current Medications: Current Meds  Medication Sig  . amLODipine (NORVASC) 5 MG tablet Take 1 tablet (5 mg total) by mouth daily.  . hydrochlorothiazide (MICROZIDE) 12.5 MG capsule Take 1 capsule (12.5 mg total) by mouth daily.  . metoprolol tartrate (LOPRESSOR) 25 MG tablet Take 1 tablet by mouth twice a day, take 1 tablet extra daily only as needed  . sildenafil (VIAGRA) 50 MG tablet Take  0.5-1 tablets (25-50 mg total) by mouth daily as needed for erectile dysfunction.  . [DISCONTINUED] amLODipine (NORVASC) 2.5 MG tablet Take 1 tablet (2.5 mg total) by mouth daily.  . [DISCONTINUED] metoprolol tartrate (LOPRESSOR) 25 MG tablet Take 0.5 tablets (12.5 mg total) by mouth 2 (two) times daily.     Allergies:   Patient has no known allergies.   Social History   Socioeconomic History  . Marital status: Unknown    Spouse name: Not on file  . Number of children: Not on file  . Years of education: Not on file  . Highest education level: Not on file  Occupational History  . Not on file  Tobacco Use  . Smoking status: Current Every Day Smoker    Packs/day: 0.25    Types: Cigarettes  . Smokeless tobacco: Never Used  Vaping Use  . Vaping Use: Never used  Substance and Sexual Activity  . Alcohol use: Yes    Alcohol/week: 1.0 standard drink    Types: 1 Cans of beer per week    Comment: occasionally  . Drug use: Never  . Sexual activity: Yes  Other Topics Concern  . Not on file  Social History Narrative  . Not on file   Social Determinants of Health   Financial Resource Strain: Not on file  Food Insecurity: Not on file  Transportation Needs: Not on file  Physical Activity: Not on file  Stress: Not on  file  Social Connections: Not on file     Family History: The patient's family history includes Prostate cancer in his maternal uncle. There is no history of Colon cancer, Colon polyps, Esophageal cancer, or Stomach cancer.  ROS:   Please see the history of present illness.    Review of Systems  Constitutional: Negative for chills and fever.  HENT: Negative for hearing loss and sore throat.   Respiratory: Positive for shortness of breath.   Cardiovascular: Positive for palpitations. Negative for chest pain, orthopnea, claudication, leg swelling and PND.  Gastrointestinal: Negative for melena, nausea and vomiting.  Genitourinary: Negative for dysuria and flank pain.   Musculoskeletal: Negative for falls and myalgias.  Neurological: Negative for dizziness and loss of consciousness.  Endo/Heme/Allergies: Negative for polydipsia.  Psychiatric/Behavioral: Negative for depression.    EKGs/Labs/Other Studies Reviewed:    The following studies were reviewed today: ECG: Sinus bradycardia; no ischemia or block  Recent Labs: 02/21/2020: TSH 1.470 09/05/2020: Hemoglobin 13.4; Platelets 196 10/31/2020: ALT 24; BUN 18; Creatinine, Ser 1.04; Magnesium 1.8; Potassium 4.1; Sodium 140  Recent Lipid Panel    Component Value Date/Time   CHOL 208 (H) 02/21/2020 1058   TRIG 131 02/21/2020 1058   HDL 51 02/21/2020 1058   CHOLHDL 4.1 02/21/2020 1058   LDLCALC 134 (H) 02/21/2020 1058      Physical Exam:    VS:  BP (!) 150/98   Pulse (!) 56   Ht 5\' 7"  (1.702 m)   Wt 160 lb (72.6 kg)   SpO2 99%   BMI 25.06 kg/m     Wt Readings from Last 3 Encounters:  11/04/20 160 lb (72.6 kg)  10/31/20 154 lb 12.8 oz (70.2 kg)  04/10/20 158 lb (71.7 kg)     GEN:  Well nourished, well developed in no acute distress HEENT: Normal NECK: No JVD; No carotid bruits CARDIAC: RRR, 1/6 systolic murmur. No  rubs, gallops RESPIRATORY:  Clear to auscultation without rales, wheezing or rhonchi  ABDOMEN: Soft, non-tender, non-distended MUSCULOSKELETAL:  No edema; No deformity  SKIN: Warm and dry NEUROLOGIC:  Alert and oriented x 3 PSYCHIATRIC:  Normal affect   ASSESSMENT:    1. Palpitations   2. Primary hypertension   3. Mixed hyperlipidemia   4. Tobacco abuse    PLAN:    In order of problems listed above:  #Palpitations: Patient with intermittent palpitations that has been ongoing for the past several months. Symptoms usually triggered by stress but can also wake him up in the middle of the night. Has associated left arm heaviness and SOB, but no chest pain, lightheadedness or dizziness. Symptoms are not exertional and improve with deep breathing and "calming himself  down."  -Increase metop to 25mg  BID with extra dose as needed for palpitations -Check 1 week zio  #HTN: Elevated today 150s/90s.  -Increase amlodipine to 5mg  daily -Continue HCTZ 12.5mg   -Increase metop as above -Keep BP log for [place] to review and adjust medications as needed  #HLD: LDL 134.  -Diet and lifestyle modifications for 3 months -Discussed mediterranean diet and exercise at length as detailed below  #Tobacco Abuse: -Tobacco cessation counseling provided today; patient motivated to quit  Exercise recommendations: Goal of exercising for at least 30 minutes a day, at least 5 times per week.  Please exercise to a moderate exertion.  This means that while exercising it is difficult to speak in full sentences, however you are not so short of breath that you feel you must stop,  and not so comfortable that you can carry on a full conversation.  Exertion level should be approximately a 5/10, if 10 is the most exertion you can perform.  Diet recommendations: Recommend a heart healthy diet such as the Mediterranean diet.  This diet consists of plant based foods, healthy fats, lean meats, olive oil.  It suggests limiting the intake of simple carbohydrates such as white breads, pastries, and pastas.  It also limits the amount of red meat, wine, and dairy products such as cheese that one should consume on a daily basis.        Medication Adjustments/Labs and Tests Ordered: Current medicines are reviewed at length with the patient today.  Concerns regarding medicines are outlined above.  Orders Placed This Encounter  Procedures  . LONG TERM MONITOR (3-14 DAYS)   Meds ordered this encounter  Medications  . amLODipine (NORVASC) 5 MG tablet    Sig: Take 1 tablet (5 mg total) by mouth daily.    Dispense:  90 tablet    Refill:  3  . metoprolol tartrate (LOPRESSOR) 25 MG tablet    Sig: Take 1 tablet by mouth twice a day, take 1 tablet extra daily only as needed    Dispense:  90 tablet     Refill:  3    Patient Instructions   Medication Instructions:  Your physician has recommended you make the following change in your medication:  1.  INCREASE the Amlodipine to 5 mg taking 1 daily 2.  INCREASE the Metoprolol to 25 mg taking 1 tablet twice a day, you may take 1 extra tablet daily only as needed   *If you need a refill on your cardiac medications before your next appointment, please call your pharmacy*   Lab Work: None ordered  If you have labs (blood work) drawn today and your tests are completely normal, you will receive your results only by: Marland Kitchen MyChart Message (if you have MyChart) OR . A paper copy in the mail If you have any lab test that is abnormal or we need to change your treatment, we will call you to review the results.   Testing/Procedures: Bryn Gulling- Long Term Monitor Instructions   Your physician has requested you wear your ZIO patch monitor 7 days.   This is a single patch monitor.  [employer] supplies one patch monitor per enrollment.  Additional stickers are not available.   Please do not apply patch if you will be having a Nuclear Stress Test, Echocardiogram, Cardiac CT, MRI, or Chest Xray during the time frame you would be wearing the monitor. The patch cannot be worn during these tests.  You cannot remove and re-apply the ZIO XT patch monitor.   Your ZIO patch monitor will be sent USPS Priority mail from [facility] directly to your home address. The monitor may also be mailed to a PO BOX if home delivery is not available.   It may take 3-5 days to receive your monitor after you have been enrolled.   Once you have received you monitor, please review enclosed instructions.  Your monitor has already been registered assigning a specific monitor serial # to you.   Applying the monitor   Shave hair from upper left chest.   Hold abrader disc by orange tab.  Rub abrader in 40 strokes over left upper chest as indicated in your monitor  instructions.   Clean area with 4 enclosed alcohol pads .  Use all pads to assure are is cleaned thoroughly.  Let dry.   Apply patch as indicated in monitor instructions.  Patch will be place under collarbone on left side of chest with arrow pointing upward.   Rub patch adhesive wings for 2 minutes.Remove white label marked "1".  Remove white label marked "2".  Rub patch adhesive wings for 2 additional minutes.   While looking in a mirror, press and release button in center of patch.  A small green light will flash 3-4 times .  This will be your only indicator the monitor has been turned on.     Do not shower for the first 24 hours.  You may shower after the first 24 hours.   Press button if you feel a symptom. You will hear a small click.  Record Date, Time and Symptom in the Patient Log Book.   When you are ready to remove patch, follow instructions on last 2 pages of Patient Log Book.  Stick patch monitor onto last page of Patient Log Book.   Place Patient Log Book in [place] box.  Use locking tab on box and tape box closed securely.  The [employer] has [employer] on it.  Please place in mailbox as soon as possible.  Your physician should have your test results approximately 7 days after the monitor has been mailed back to [facility].   Call Britt at [phone] if you have questions regarding your ZIO XT patch monitor.  Call them immediately if you see an orange light blinking on your monitor.   If your monitor falls off in less than 4 days contact our Monitor department at [phone].  If your monitor becomes loose or falls off after 4 days call [employer] at [phone] for suggestions on securing your monitor.     Follow-Up: At [facility], you and your health needs are our priority.  As part of our continuing mission to provide you with exceptional heart care, we have created designated Provider Care Teams.  These Care Teams include your  primary Cardiologist (physician) and Advanced Practice Providers (APPs -  Physician Assistants and Nurse Practitioners) who all work together to provide you with the care you need, when you need it.  We recommend signing up for the patient portal called "MyChart".  Sign up information is provided on this After Visit Summary.  MyChart is used to connect with patients for Virtual Visits (Telemedicine).  Patients are able to view lab/test results, encounter notes, upcoming appointments, etc.  Non-urgent messages can be sent to your provider as well.   To learn more about what you can do with MyChart, go to [email or web address].    Your next appointment:   3 month(s)  You will be getting fasting labs at this appointment.  The format for your next appointment:   In Person  Provider:   Gwyndolyn Kaufman, MD   Other Instructions   Mediterranean Diet A Mediterranean diet refers to food and lifestyle choices that are based on the traditions of countries located on the [place]. This way of eating has been shown to help prevent certain conditions and improve outcomes for people who have chronic diseases, like kidney disease and heart disease. What are tips for following this plan? Lifestyle  Cook and eat meals together with your family, when possible.  Drink enough fluid to keep your urine clear or pale yellow.  Be physically active every day. This includes: ? Aerobic exercise like running or swimming. ? Leisure activities like gardening, walking, or housework.  Get 7-8 hours of sleep each night.  If recommended by your health care provider, drink red wine in moderation. This means 1 glass a day for nonpregnant women and 2 glasses a day for men. A glass of wine equals 5 oz (150 mL). Reading food labels  Check the serving size of packaged foods. For foods such as rice and pasta, the serving size refers to the amount of cooked product, not dry.  Check the total fat in packaged  foods. Avoid foods that have saturated fat or trans fats.  Check the ingredients list for added sugars, such as corn syrup.   Shopping  At the grocery store, buy most of your food from the areas near the walls of the store. This includes: ? Fresh fruits and vegetables (produce). ? Grains, beans, nuts, and seeds. Some of these may be available in unpackaged forms or large amounts (in bulk). ? Fresh seafood. ? Poultry and eggs. ? Low-fat dairy products.  Buy whole ingredients instead of prepackaged foods.  Buy fresh fruits and vegetables in-season from local farmers markets.  Buy frozen fruits and vegetables in resealable bags.  If you do not have access to quality fresh seafood, buy precooked frozen shrimp or canned fish, such as tuna, salmon, or sardines.  Buy small amounts of raw or cooked vegetables, salads, or olives from the deli or salad bar at your store.  Stock your pantry so you always have certain foods on hand, such as olive oil, canned tuna, canned tomatoes, rice, pasta, and beans. Cooking  Cook foods with extra-virgin olive oil instead of using butter or other vegetable oils.  Have meat as a side dish, and have vegetables or grains as your main dish. This means having meat in small portions or adding small amounts of meat to foods like pasta or stew.  Use beans or vegetables instead of meat in common dishes like chili or lasagna.  Experiment with different cooking methods. Try roasting or broiling vegetables instead of steaming or sauteing them.  Add frozen vegetables to soups, stews, pasta, or rice.  Add nuts or seeds for added healthy fat at each meal. You can add these to yogurt, salads, or vegetable dishes.  Marinate fish or vegetables using olive oil, lemon juice, garlic, and fresh herbs. Meal planning  Plan to eat 1 vegetarian meal one day each week. Try to work up to 2 vegetarian meals, if possible.  Eat seafood 2 or more times a week.  Have healthy  snacks readily available, such as: ? Vegetable sticks with hummus. ? [place] yogurt. ? Fruit and nut trail mix.  Eat balanced meals throughout the week. This includes: ? Fruit: 2-3 servings a day ? Vegetables: 4-5 servings a day ? Low-fat dairy: 2 servings a day ? Fish, poultry, or lean meat: 1 serving a day ? Beans and legumes: 2 or more servings a week ? Nuts and seeds: 1-2 servings a day ? Whole grains: 6-8 servings a day ? Extra-virgin olive oil: 3-4 servings a day  Limit red meat and sweets to only a few servings a month   What are my food choices?  Mediterranean diet ? Recommended  Grains: Whole-grain pasta. Brown rice. Bulgar wheat. Polenta. Couscous. Whole-wheat bread. [place].  Vegetables: Artichokes. Beets. Broccoli. Cabbage. Carrots. Eggplant. Green beans. Chard. Kale. Spinach. Onions. Leeks. Peas. Squash. Tomatoes. Peppers. Radishes.  Fruits: Apples. Apricots. Avocado. Berries. Bananas. Cherries. Dates. Figs. Grapes. Lemons. Melon. Oranges. Peaches. Plums. Pomegranate.  Meats and other protein foods:  Beans. Almonds. Sunflower seeds. Pine nuts. Peanuts. [place]. Salmon. Scallops. Shrimp. [place]. Tilapia. Clams. Oysters. Eggs.  Dairy: Low-fat milk. Cheese. Greek yogurt.  Beverages: Water. Red wine. Herbal tea.  Fats and oils: Extra virgin olive oil. Avocado oil. Grape seed oil.  Sweets and desserts: [place] yogurt with honey. Baked apples. Poached pears. Trail mix.  Seasoning and other foods: Basil. Cilantro. Coriander. Cumin. Mint. Parsley. Sage. Rosemary. Tarragon. Garlic. Oregano. Thyme. Pepper. Balsalmic vinegar. Tahini. Hummus. Tomato sauce. Olives. Mushrooms. ? Limit these  Grains: Prepackaged pasta or rice dishes. Prepackaged cereal with added sugar.  Vegetables: Deep fried potatoes (french fries).  Fruits: Fruit canned in syrup.  Meats and other protein foods: Beef. Pork. Lamb. Poultry with skin. Hot dogs. Berniece Salines.  Dairy: Ice cream. Sour cream. Whole  milk.  Beverages: Juice. Sugar-sweetened soft drinks. Beer. Liquor and spirits.  Fats and oils: Butter. Canola oil. Vegetable oil. Beef fat (tallow). Lard.  Sweets and desserts: Cookies. Cakes. Pies. Candy.  Seasoning and other foods: Mayonnaise. Premade sauces and marinades. The items listed may not be a complete list. Talk with your dietitian about what dietary choices are right for you. Summary  The Mediterranean diet includes both food and lifestyle choices.  Eat a variety of fresh fruits and vegetables, beans, nuts, seeds, and whole grains.  Limit the amount of red meat and sweets that you eat.  Talk with your health care provider about whether it is safe for you to drink red wine in moderation. This means 1 glass a day for nonpregnant women and 2 glasses a day for men. A glass of wine equals 5 oz (150 mL). This information is not intended to replace advice given to you by your health care provider. Make sure you discuss any questions you have with your health care provider. Document Revised: 04/23/2016 Document Reviewed: 04/16/2016 Elsevier Patient Education  2020 [employer].     Signed, Freada Bergeron, MD  11/04/2020 12:15 PM    [place]

## 2020-11-04 ENCOUNTER — Other Ambulatory Visit: Payer: Self-pay

## 2020-11-04 ENCOUNTER — Encounter: Payer: Self-pay | Admitting: *Deleted

## 2020-11-04 ENCOUNTER — Ambulatory Visit (INDEPENDENT_AMBULATORY_CARE_PROVIDER_SITE_OTHER): Payer: 59 | Admitting: Cardiology

## 2020-11-04 ENCOUNTER — Encounter: Payer: Self-pay | Admitting: Cardiology

## 2020-11-04 ENCOUNTER — Ambulatory Visit (INDEPENDENT_AMBULATORY_CARE_PROVIDER_SITE_OTHER): Payer: 59

## 2020-11-04 ENCOUNTER — Other Ambulatory Visit: Payer: Self-pay | Admitting: Cardiology

## 2020-11-04 VITALS — BP 150/98 | HR 56 | Ht 67.0 in | Wt 160.0 lb

## 2020-11-04 DIAGNOSIS — Z720 Tobacco use: Secondary | ICD-10-CM

## 2020-11-04 DIAGNOSIS — E782 Mixed hyperlipidemia: Secondary | ICD-10-CM

## 2020-11-04 DIAGNOSIS — R002 Palpitations: Secondary | ICD-10-CM

## 2020-11-04 DIAGNOSIS — I10 Essential (primary) hypertension: Secondary | ICD-10-CM | POA: Diagnosis not present

## 2020-11-04 MED ORDER — AMLODIPINE BESYLATE 5 MG PO TABS: 5.0000 mg | ORAL_TABLET | Freq: Every day | ORAL | 3 refills | Status: DC

## 2020-11-04 MED ORDER — METOPROLOL TARTRATE 25 MG PO TABS: ORAL_TABLET | ORAL | 3 refills | Status: DC

## 2020-11-04 MED FILL — AMLODIPINE BESYLATE 5 MG TA: 5 | 90 days supply | Qty: 90 | Fill #0

## 2020-11-04 NOTE — Progress Notes (Signed)
Patient ID: Travis Huynh, male   DOB: [birth year], 55 y.o.   MRN: [id number] Patient enrolled for [employer] to ship a 7 day ZIO XT to his home.

## 2020-11-04 NOTE — Patient Instructions (Signed)
Medication Instructions:  Your physician has recommended you make the following change in your medication:  1.  INCREASE the Amlodipine to 5 mg taking 1 daily 2.  INCREASE the Metoprolol to 25 mg taking 1 tablet twice a day, you may take 1 extra tablet daily only as needed   *If you need a refill on your cardiac medications before your next appointment, please call your pharmacy*   Lab Work: None ordered  If you have labs (blood work) drawn today and your tests are completely normal, you will receive your results only by: Marland Kitchen MyChart Message (if you have MyChart) OR . A paper copy in the mail If you have any lab test that is abnormal or we need to change your treatment, we will call you to review the results.   Testing/Procedures: [facility]- Long Term Monitor Instructions   Your physician has requested you wear your ZIO patch monitor 7 days.   This is a single patch monitor.  [employer] supplies one patch monitor per enrollment.  Additional stickers are not available.   Please do not apply patch if you will be having a Nuclear Stress Test, Echocardiogram, Cardiac CT, MRI, or Chest Xray during the time frame you would be wearing the monitor. The patch cannot be worn during these tests.  You cannot remove and re-apply the ZIO XT patch monitor.   Your ZIO patch monitor will be sent USPS Priority mail from [facility] directly to your home address. The monitor may also be mailed to a PO BOX if home delivery is not available.   It may take 3-5 days to receive your monitor after you have been enrolled.   Once you have received you monitor, please review enclosed instructions.  Your monitor has already been registered assigning a specific monitor serial # to you.   Applying the monitor   Shave hair from upper left chest.   Hold abrader disc by orange tab.  Rub abrader in 40 strokes over left upper chest as indicated in your monitor instructions.   Clean area with 4 enclosed alcohol pads  .  Use all pads to assure are is cleaned thoroughly.  Let dry.   Apply patch as indicated in monitor instructions.  Patch will be place under collarbone on left side of chest with arrow pointing upward.   Rub patch adhesive wings for 2 minutes.Remove white label marked "1".  Remove white label marked "2".  Rub patch adhesive wings for 2 additional minutes.   While looking in a mirror, press and release button in center of patch.  A small green light will flash 3-4 times .  This will be your only indicator the monitor has been turned on.     Do not shower for the first 24 hours.  You may shower after the first 24 hours.   Press button if you feel a symptom. You will hear a small click.  Record Date, Time and Symptom in the Patient Log Book.   When you are ready to remove patch, follow instructions on last 2 pages of Patient Log Book.  Stick patch monitor onto last page of Patient Log Book.   Place Patient Log Book in [place] box.  Use locking tab on box and tape box closed securely.  The [employer] has [employer] on it.  Please place in mailbox as soon as possible.  Your physician should have your test results approximately 7 days after the monitor has been mailed back  to [employer].   Call [place] at [phone] if you have questions regarding your ZIO XT patch monitor.  Call them immediately if you see an orange light blinking on your monitor.   If your monitor falls off in less than 4 days contact our Monitor department at [phone].  If your monitor becomes loose or falls off after 4 days call [employer] at [phone] for suggestions on securing your monitor.     Follow-Up: At [facility], you and your health needs are our priority.  As part of our continuing mission to provide you with exceptional heart care, we have created designated Provider Care Teams.  These Care Teams include your primary Cardiologist (physician) and Advanced Practice  Providers (APPs -  Physician Assistants and Nurse Practitioners) who all work together to provide you with the care you need, when you need it.  We recommend signing up for the patient portal called "MyChart".  Sign up information is provided on this After Visit Summary.  MyChart is used to connect with patients for Virtual Visits (Telemedicine).  Patients are able to view lab/test results, encounter notes, upcoming appointments, etc.  Non-urgent messages can be sent to your provider as well.   To learn more about what you can do with MyChart, go to [email or web address].    Your next appointment:   3 month(s)  You will be getting fasting labs at this appointment.  The format for your next appointment:   In Person  Provider:   Gwyndolyn Kaufman, MD   Other Instructions   Mediterranean Diet A Mediterranean diet refers to food and lifestyle choices that are based on the traditions of countries located on the [place]. This way of eating has been shown to help prevent certain conditions and improve outcomes for people who have chronic diseases, like kidney disease and heart disease. What are tips for following this plan? Lifestyle  Cook and eat meals together with your family, when possible.  Drink enough fluid to keep your urine clear or pale yellow.  Be physically active every day. This includes: ? Aerobic exercise like running or swimming. ? Leisure activities like gardening, walking, or housework.  Get 7-8 hours of sleep each night.  If recommended by your health care provider, drink red wine in moderation. This means 1 glass a day for nonpregnant women and 2 glasses a day for men. A glass of wine equals 5 oz (150 mL). Reading food labels  Check the serving size of packaged foods. For foods such as rice and pasta, the serving size refers to the amount of cooked product, not dry.  Check the total fat in packaged foods. Avoid foods that have saturated fat or trans  fats.  Check the ingredients list for added sugars, such as corn syrup.   Shopping  At the grocery store, buy most of your food from the areas near the walls of the store. This includes: ? Fresh fruits and vegetables (produce). ? Grains, beans, nuts, and seeds. Some of these may be available in unpackaged forms or large amounts (in bulk). ? Fresh seafood. ? Poultry and eggs. ? Low-fat dairy products.  Buy whole ingredients instead of prepackaged foods.  Buy fresh fruits and vegetables in-season from local farmers markets.  Buy frozen fruits and vegetables in resealable bags.  If you do not have access to quality fresh seafood, buy precooked frozen shrimp or canned fish, such as tuna, salmon, or sardines.  Buy small amounts of raw or cooked vegetables,  salads, or olives from the deli or salad bar at your store.  Stock your pantry so you always have certain foods on hand, such as olive oil, canned tuna, canned tomatoes, rice, pasta, and beans. Cooking  Cook foods with extra-virgin olive oil instead of using butter or other vegetable oils.  Have meat as a side dish, and have vegetables or grains as your main dish. This means having meat in small portions or adding small amounts of meat to foods like pasta or stew.  Use beans or vegetables instead of meat in common dishes like chili or lasagna.  Experiment with different cooking methods. Try roasting or broiling vegetables instead of steaming or sauteing them.  Add frozen vegetables to soups, stews, pasta, or rice.  Add nuts or seeds for added healthy fat at each meal. You can add these to yogurt, salads, or vegetable dishes.  Marinate fish or vegetables using olive oil, lemon juice, garlic, and fresh herbs. Meal planning  Plan to eat 1 vegetarian meal one day each week. Try to work up to 2 vegetarian meals, if possible.  Eat seafood 2 or more times a week.  Have healthy snacks readily available, such as: ? Vegetable sticks  with hummus. ? [place] yogurt. ? Fruit and nut trail mix.  Eat balanced meals throughout the week. This includes: ? Fruit: 2-3 servings a day ? Vegetables: 4-5 servings a day ? Low-fat dairy: 2 servings a day ? Fish, poultry, or lean meat: 1 serving a day ? Beans and legumes: 2 or more servings a week ? Nuts and seeds: 1-2 servings a day ? Whole grains: 6-8 servings a day ? Extra-virgin olive oil: 3-4 servings a day  Limit red meat and sweets to only a few servings a month   What are my food choices?  Mediterranean diet ? Recommended  Grains: Whole-grain pasta. Brown rice. Bulgar wheat. Polenta. Couscous. Whole-wheat bread. [place].  Vegetables: Artichokes. Beets. Broccoli. Cabbage. Carrots. Eggplant. Green beans. Chard. Kale. Spinach. Onions. Leeks. Peas. Squash. Tomatoes. Peppers. Radishes.  Fruits: Apples. Apricots. Avocado. Berries. Bananas. Cherries. Dates. Figs. Grapes. Lemons. Melon. Oranges. Peaches. Plums. Pomegranate.  Meats and other protein foods: Beans. Almonds. Sunflower seeds. Pine nuts. Peanuts. [place]. Salmon. Scallops. Shrimp. Antelope. Tilapia. Clams. Oysters. Eggs.  Dairy: Low-fat milk. Cheese. Greek yogurt.  Beverages: Water. Red wine. Herbal tea.  Fats and oils: Extra virgin olive oil. Avocado oil. Grape seed oil.  Sweets and desserts: [place] yogurt with honey. Baked apples. Poached pears. Trail mix.  Seasoning and other foods: Basil. Cilantro. Coriander. Cumin. Mint. Parsley. Sage. Rosemary. Tarragon. Garlic. Oregano. Thyme. Pepper. Balsalmic vinegar. Tahini. Hummus. Tomato sauce. Olives. Mushrooms. ? Limit these  Grains: Prepackaged pasta or rice dishes. Prepackaged cereal with added sugar.  Vegetables: Deep fried potatoes (french fries).  Fruits: Fruit canned in syrup.  Meats and other protein foods: Beef. Pork. Lamb. Poultry with skin. Hot dogs. Berniece Salines.  Dairy: Ice cream. Sour cream. Whole milk.  Beverages: Juice. Sugar-sweetened soft drinks.  Beer. Liquor and spirits.  Fats and oils: Butter. Canola oil. Vegetable oil. Beef fat (tallow). Lard.  Sweets and desserts: Cookies. Cakes. Pies. Candy.  Seasoning and other foods: Mayonnaise. Premade sauces and marinades. The items listed may not be a complete list. Talk with your dietitian about what dietary choices are right for you. Summary  The Mediterranean diet includes both food and lifestyle choices.  Eat a variety of fresh fruits and vegetables, beans, nuts, seeds, and whole grains.  Limit the amount  of red meat and sweets that you eat.  Talk with your health care provider about whether it is safe for you to drink red wine in moderation. This means 1 glass a day for nonpregnant women and 2 glasses a day for men. A glass of wine equals 5 oz (150 mL). This information is not intended to replace advice given to you by your health care provider. Make sure you discuss any questions you have with your health care provider. Document Revised: 04/23/2016 Document Reviewed: 04/16/2016 Elsevier Patient Education  [place].

## 2020-11-07 DIAGNOSIS — R002 Palpitations: Secondary | ICD-10-CM | POA: Diagnosis not present

## 2020-11-07 DIAGNOSIS — E782 Mixed hyperlipidemia: Secondary | ICD-10-CM

## 2020-11-07 DIAGNOSIS — I10 Essential (primary) hypertension: Secondary | ICD-10-CM

## 2020-11-07 DIAGNOSIS — Z720 Tobacco use: Secondary | ICD-10-CM | POA: Diagnosis not present

## 2020-11-21 DIAGNOSIS — I10 Essential (primary) hypertension: Secondary | ICD-10-CM | POA: Diagnosis not present

## 2020-11-21 DIAGNOSIS — R002 Palpitations: Secondary | ICD-10-CM | POA: Diagnosis not present

## 2020-12-28 ENCOUNTER — Emergency Department (INDEPENDENT_AMBULATORY_CARE_PROVIDER_SITE_OTHER)
Admission: EM | Admit: 2020-12-28 | Discharge: 2020-12-28 | Disposition: A | Discharge status: Home / Self Care | Payer: 59 | Source: Home / Self Care | Attending: Family Medicine | Admitting: Family Medicine

## 2020-12-28 ENCOUNTER — Other Ambulatory Visit: Payer: Self-pay

## 2020-12-28 ENCOUNTER — Encounter: Payer: Self-pay | Admitting: Emergency Medicine

## 2020-12-28 ENCOUNTER — Emergency Department (INDEPENDENT_AMBULATORY_CARE_PROVIDER_SITE_OTHER): Payer: 59

## 2020-12-28 DIAGNOSIS — R042 Hemoptysis: Secondary | ICD-10-CM

## 2020-12-28 DIAGNOSIS — R040 Epistaxis: Secondary | ICD-10-CM

## 2020-12-28 DIAGNOSIS — R509 Fever, unspecified: Secondary | ICD-10-CM

## 2020-12-28 DIAGNOSIS — R059 Cough, unspecified: Secondary | ICD-10-CM | POA: Diagnosis not present

## 2020-12-28 MED ORDER — BENZONATATE 200 MG PO CAPS: 200.0000 mg | ORAL_CAPSULE | Freq: Two times a day (BID) | ORAL | 0 refills | Status: DC | PRN

## 2020-12-28 MED ORDER — MUPIROCIN 2 % EX OINT: 1.0000 "application " | TOPICAL_OINTMENT | Freq: Two times a day (BID) | CUTANEOUS | 0 refills | Status: DC

## 2020-12-28 NOTE — Discharge Instructions (Signed)
More water Take Tessalon for the cough Put the ointment in your nose 2 times a day May use salt water or saline nasal sprays for increased moisture in the nose Your test results will be available in MyChart. You may not return to work until your COVID and flu tests are negative Call health at work for return to work advice

## 2020-12-28 NOTE — ED Triage Notes (Signed)
Patient states that he has a "pull in his chest" since last Wednesday.  Patient had blood while vomiting last night and today.  Patient's nose started bleeding today.  Patient took OTC cough meds.  Patient is fully vaccinated.

## 2020-12-29 NOTE — ED Provider Notes (Signed)
[facility]    CSN: [id number] Arrival date & time: 12/28/20  1458      History   Chief Complaint Chief Complaint  Patient presents with  . Pull in chest    HPI Travis Hu[facility] is a 55 y.o. male.   HPI   Patient is an employee of the hospital.  He has vaccinated for COVID and for influenza.  He is here for an upper respiratory infection.  Coughing.  "Pull" in his chest with coughing and deep breath.  Has not noted that he had a fever though it is present on evaluation today.  He also complains of nosebleeds.  Has had 3 nosebleeds in the last couple of days.  Does not usually have nosebleeds.  Does not endorse allergies.  He has been taking over-the-counter cough medicine.  Denies headache and body ache.  Denies nausea or vomiting.  No known exposure to COVID, flu, strep Well-controlled hypertension on Norvasc and hydrochlorothiazide.  Takes Lopressor as needed for rapid heart rate  Past Medical History:  Diagnosis Date  . Hypertension   . Palpitations     There are no problems to display for this patient.   Past Surgical History:  Procedure Laterality Date  . MOUTH SURGERY         Home Medications    Prior to Admission medications   Medication Sig Start Date End Date Taking? Authorizing Provider  amLODipine (NORVASC) 5 MG tablet TAKE 1 TABLET BY MOUTH DAILY 11/04/20 11/04/21 Yes Freada Bergeron, MD  benzonatate (TESSALON) 200 MG capsule Take 1 capsule (200 mg total) by mouth 2 (two) times daily as needed for cough. 12/28/20  Yes Raylene Everts, MD  hydrochlorothiazide (MICROZIDE) 12.5 MG capsule TAKE 1 CAPSULE BY MOUTH DAILY 10/31/20 10/31/21 Yes Just, Laurita Quint, FNP  LORazepam (ATIVAN) 0.5 MG tablet TAKE 1 TABLET BY MOUTH ONCE A DAY AS NEEDED FOR ANXIETY 09/05/20 03/04/21 Yes Harvest Dark, MD  metoprolol tartrate (LOPRESSOR) 25 MG tablet TAKE 1 TABLET BY MOUTH 2 TIMES DAILY. TAKE 1 EXTRA TABLET A DAY ONLY AS NEEDED 11/04/20 11/04/21 Yes Freada Bergeron, MD  mupirocin ointment (BACTROBAN) 2 % Apply 1 application topically 2 (two) times daily. 12/28/20  Yes Raylene Everts, MD  sildenafil (VIAGRA) 50 MG tablet TAKE 1/2-1 TABLET BY MOUTH DAILY AS NEEDED FOR ERECTILE DYSFUNCTION 10/31/20 10/31/21 Yes Just, Laurita Quint, FNP    Family History Family History  Problem Relation Age of Onset  . Prostate cancer Maternal Uncle   . Colon cancer Neg Hx   . Colon polyps Neg Hx   . Esophageal cancer Neg Hx   . Stomach cancer Neg Hx     Social History Social History   Tobacco Use  . Smoking status: Current Every Day Smoker    Packs/day: 0.25    Types: Cigarettes  . Smokeless tobacco: Never Used  Vaping Use  . Vaping Use: Never used  Substance Use Topics  . Alcohol use: Yes    Alcohol/week: 1.0 standard drink    Types: 1 Cans of beer per week    Comment: occasionally  . Drug use: Never     Allergies   Patient has no known allergies.   Review of Systems Review of Systems See HPI  Physical Exam Triage Vital Signs ED Triage Vitals  Enc Vitals Group     BP 12/28/20 1559 125/85     Pulse Rate 12/28/20 1559 69     Resp 12/28/20 1559 16  Temp 12/28/20 1559 (!) 100.5 F (38.1 C)     Temp Source 12/28/20 1559 Oral     SpO2 12/28/20 1559 97 %     Weight --      Height --      Head Circumference --      Peak Flow --      Pain Score 12/28/20 1601 7     Pain Loc --      Pain Edu? --      Excl. in [place]? --    No data found.  Updated Vital Signs BP 125/85 (BP Location: Left Arm)   Pulse 69   Temp (!) 100.5 F (38.1 C) (Oral)   Resp 16   SpO2 97%     Physical Exam Constitutional:      General: He is not in acute distress.    Appearance: He is well-developed.  HENT:     Head: Normocephalic and atraumatic.     Right Ear: Tympanic membrane and ear canal normal.     Left Ear: Tympanic membrane and ear canal normal.     Nose: Nose normal.     Comments: Nasal passages are clear with no bleeding noted     Mouth/Throat:     Mouth: Mucous membranes are moist.     Pharynx: No posterior oropharyngeal erythema.  Eyes:     Conjunctiva/sclera: Conjunctivae normal.     Pupils: Pupils are equal, round, and reactive to light.  Cardiovascular:     Rate and Rhythm: Normal rate and regular rhythm.     Heart sounds: Normal heart sounds.  Pulmonary:     Effort: Pulmonary effort is normal. No respiratory distress.     Breath sounds: Normal breath sounds.     Comments: Lungs are clear.  No chest wall tenderness Abdominal:     General: There is no distension.     Palpations: Abdomen is soft.  Musculoskeletal:        General: Normal range of motion.     Cervical back: Normal range of motion.  Lymphadenopathy:     Cervical: No cervical adenopathy.  Skin:    General: Skin is warm and dry.  Neurological:     Mental Status: He is alert.      UC Treatments / Results  Labs (all labs ordered are listed, but only abnormal results are displayed) Labs Reviewed  COVID-19, FLU A+B NAA    EKG   Radiology DG Chest 2 View  Result Date: 12/28/2020 CLINICAL DATA:  Cough and fever for several days.  Hemoptysis. EXAM: CHEST - 2 VIEW COMPARISON:  09/05/2020 FINDINGS: The heart size and mediastinal contours are within normal limits. Both lungs are clear. The visualized skeletal structures are unremarkable. IMPRESSION: No active cardiopulmonary disease. Electronically Signed   By: Marlaine Hind M.D.   On: 12/28/2020 16:35    Procedures Procedures (including critical care time)  Medications Ordered in UC Medications - No data to display  Initial Impression / Assessment and Plan / UC Course  I have reviewed the triage vital signs and the nursing notes.  Pertinent labs & imaging results that were available during my care of the patient were reviewed by me and considered in my medical decision making (see chart for details).     Patient states that he has been coughing up blood.  Has some pain in his  chest.  Fever on evaluation.  I think that he is coughing up blood because of his nosebleed and some postnasal  Migration.  I am ordering  a x-ray for that.  He may have hemoptysis.  Patient is a smoker.  Final Clinical Impressions(s) / UC Diagnoses   Final diagnoses:  Cough  Frequent nosebleeds     Discharge Instructions     More water Take Tessalon for the cough Put the ointment in your nose 2 times a day May use salt water or saline nasal sprays for increased moisture in the nose Your test results will be available in MyChart. You may not return to work until your COVID and flu tests are negative Call health at work for return to work advice    ED Prescriptions    Medication Sig Dispense Auth. Provider   benzonatate (TESSALON) 200 MG capsule Take 1 capsule (200 mg total) by mouth 2 (two) times daily as needed for cough. 20 capsule Raylene Everts, MD   mupirocin ointment (BACTROBAN) 2 % Apply 1 application topically 2 (two) times daily. 22 g Raylene Everts, MD     PDMP not reviewed this encounter.   Raylene Everts, MD 12/29/20 [phone]

## 2020-12-30 LAB — COVID-19, FLU A+B NAA
Influenza A, NAA: NOT DETECTED
Influenza B, NAA: NOT DETECTED
SARS-CoV-2, NAA: NOT DETECTED

## 2021-01-01 ENCOUNTER — Other Ambulatory Visit: Payer: Self-pay

## 2021-01-01 ENCOUNTER — Encounter: Payer: Self-pay | Admitting: Emergency Medicine

## 2021-01-01 ENCOUNTER — Emergency Department
Admission: EM | Admit: 2021-01-01 | Discharge: 2021-01-01 | Disposition: A | Discharge status: Home / Self Care | Payer: 59 | Attending: Emergency Medicine | Admitting: Emergency Medicine

## 2021-01-01 DIAGNOSIS — R0789 Other chest pain: Secondary | ICD-10-CM | POA: Insufficient documentation

## 2021-01-01 DIAGNOSIS — R040 Epistaxis: Secondary | ICD-10-CM | POA: Insufficient documentation

## 2021-01-01 DIAGNOSIS — F17210 Nicotine dependence, cigarettes, uncomplicated: Secondary | ICD-10-CM | POA: Diagnosis not present

## 2021-01-01 DIAGNOSIS — R0981 Nasal congestion: Secondary | ICD-10-CM | POA: Diagnosis not present

## 2021-01-01 DIAGNOSIS — I10 Essential (primary) hypertension: Secondary | ICD-10-CM | POA: Diagnosis not present

## 2021-01-01 DIAGNOSIS — Z79899 Other long term (current) drug therapy: Secondary | ICD-10-CM | POA: Insufficient documentation

## 2021-01-01 DIAGNOSIS — R079 Chest pain, unspecified: Secondary | ICD-10-CM

## 2021-01-01 LAB — COMPREHENSIVE METABOLIC PANEL
ALT: 33 U/L (ref 0–44)
AST: 25 U/L (ref 15–41)
Albumin: 3.7 g/dL (ref 3.5–5.0)
Alkaline Phosphatase: 56 U/L (ref 38–126)
Anion gap: 10 (ref 5–15)
BUN: 20 mg/dL (ref 6–20)
CO2: 23 mmol/L (ref 22–32)
Calcium: 9.5 mg/dL (ref 8.9–10.3)
Chloride: 107 mmol/L (ref 98–111)
Creatinine, Ser: 1.01 mg/dL (ref 0.61–1.24)
GFR, Estimated: >60 mL/min (ref >60)
Glucose, Bld: 130 mg/dL — ABNORMAL HIGH (ref 70–99)
Potassium: 3.6 mmol/L (ref 3.5–5.1)
Sodium: 140 mmol/L (ref 135–145)
Total Bilirubin: 0.7 mg/dL (ref 0.3–1.2)
Total Protein: 7.3 g/dL (ref 6.5–8.1)

## 2021-01-01 LAB — CBC
HCT: 35.8 % — ABNORMAL LOW (ref 39.0–52.0)
Hemoglobin: 11.7 g/dL — ABNORMAL LOW (ref 13.0–17.0)
MCH: 27.7 pg (ref 26.0–34.0)
MCHC: 32.7 g/dL (ref 30.0–36.0)
MCV: 84.6 fL (ref 80.0–100.0)
Platelets: 339 10*3/uL (ref 150–400)
RBC: 4.23 MIL/uL (ref 4.22–5.81)
RDW: 14.4 % (ref 11.5–15.5)
WBC: 4.9 10*3/uL (ref 4.0–10.5)
nRBC: 0 % (ref 0.0–0.2)

## 2021-01-01 LAB — TROPONIN I (HIGH SENSITIVITY): Troponin I (High Sensitivity): 3 ng/L (ref <18)

## 2021-01-01 MED ORDER — OXYMETAZOLINE HCL 0.05 % NA SOLN: 2.0000 | Freq: Two times a day (BID) | NASAL | 0 refills | Status: DC | PRN

## 2021-01-01 NOTE — ED Notes (Signed)
Pt verbalized understanding of d/c instructions and had the opportunity to ask questions as needed. Pt ambulatory to ED lobby, NAD noted, steady gait noted, RR even and unlabored at this time

## 2021-01-01 NOTE — ED Triage Notes (Signed)
Pt to ED via POV with c/o L sided CP and cough since last Wednesday. Pt seen several days ago for same. Pt denies radiation at this time. Pt states pain reproduceable with cough at this time. Pt states has also had nosebleeds at this time.

## 2021-01-01 NOTE — Discharge Instructions (Addendum)
As we discussed please apply Vaseline to each nostril twice daily for the next 7 days.  If your nose begins bleeding again please use Afrin 2 sprays in each affected nostril and then clamp for 10 minutes.  Return to the emergency department for any return of/worsening chest pain, significant nosebleed, or any other symptom personally concerning to yourself.  Otherwise please follow-up with your doctor in 2 days for recheck/reevaluation.

## 2021-01-01 NOTE — ED Provider Notes (Signed)
[facility] Emergency Department Provider Note  Time seen: 10:15 AM  I have reviewed the triage vital signs and the nursing notes.   HISTORY  Chief Complaint Chest Pain   HPI Travis Huynh is a 55 y.o. male with a past medical history of hypertension who presents to the emergency department for evaluation of a nosebleed.  According to the patient since Saturday he has been experiencing mild cough felt like he had a head cold was seen at urgent care ultimately discharged home on Saturday.  States then Saturday he has continued to have intermittent nosebleeds out of the left nostril.  No history of nosebleeds previously.  States the cough is largely resolved.  Denies any sputum production.  Denies any fever at any point.  Patient states he had a COVID test on Saturday that was negative had a chest x-ray that was clear.  Continues to have very slight left-sided chest discomfort which he describes as "congestion".  No pleuritic pain.  No leg pain.   Patient states his nose began bleeding once again this morning which concerned him so he came to the emergency department.  Past Medical History:  Diagnosis Date  . Hypertension   . Palpitations     There are no problems to display for this patient.   Past Surgical History:  Procedure Laterality Date  . MOUTH SURGERY      Prior to Admission medications   Medication Sig Start Date End Date Taking? Authorizing Provider  amLODipine (NORVASC) 5 MG tablet TAKE 1 TABLET BY MOUTH DAILY 11/04/20 11/04/21  Freada Bergeron, MD  benzonatate (TESSALON) 200 MG capsule Take 1 capsule (200 mg total) by mouth 2 (two) times daily as needed for cough. 12/28/20   Raylene Everts, MD  hydrochlorothiazide (MICROZIDE) 12.5 MG capsule TAKE 1 CAPSULE BY MOUTH DAILY 10/31/20 10/31/21  Just, Laurita Quint, FNP  LORazepam (ATIVAN) 0.5 MG tablet TAKE 1 TABLET BY MOUTH ONCE A DAY AS NEEDED FOR ANXIETY 09/05/20 03/04/21  Harvest Dark, MD   metoprolol tartrate (LOPRESSOR) 25 MG tablet TAKE 1 TABLET BY MOUTH 2 TIMES DAILY. TAKE 1 EXTRA TABLET A DAY ONLY AS NEEDED 11/04/20 11/04/21  Freada Bergeron, MD  mupirocin ointment (BACTROBAN) 2 % Apply 1 application topically 2 (two) times daily. 12/28/20   Raylene Everts, MD  sildenafil (VIAGRA) 50 MG tablet TAKE 1/2-1 TABLET BY MOUTH DAILY AS NEEDED FOR ERECTILE DYSFUNCTION 10/31/20 10/31/21  Just, Laurita Quint, FNP    No Known Allergies  Family History  Problem Relation Age of Onset  . Prostate cancer Maternal Uncle   . Colon cancer Neg Hx   . Colon polyps Neg Hx   . Esophageal cancer Neg Hx   . Stomach cancer Neg Hx     Social History Social History   Tobacco Use  . Smoking status: Current Every Day Smoker    Packs/day: 0.25    Types: Cigarettes  . Smokeless tobacco: Never Used  Vaping Use  . Vaping Use: Never used  Substance Use Topics  . Alcohol use: Yes    Alcohol/week: 1.0 standard drink    Types: 1 Cans of beer per week    Comment: occasionally  . Drug use: Never    Review of Systems Constitutional: Negative for fever. ENT: Mild congestion.  Nosebleed. Cardiovascular: Left-sided chest discomfort/congestion.  No longer has a cough per patient. Respiratory: Negative for shortness of breath. Gastrointestinal: Negative for abdominal pain, vomiting  Musculoskeletal: Negative for musculoskeletal complaints Neurological:  Negative for headache All other ROS negative  ____________________________________________   PHYSICAL EXAM:  VITAL SIGNS: ED Triage Vitals  Enc Vitals Group     BP 01/01/21 0916 (!) 147/94     Pulse Rate 01/01/21 0916 61     Resp 01/01/21 0916 18     Temp 01/01/21 0916 97.6 F (36.4 C)     Temp Source 01/01/21 0916 Oral     SpO2 01/01/21 0916 98 %     Weight 01/01/21 0918 149 lb (67.6 kg)     Height 01/01/21 0918 5\' 7"  (1.702 m)     Head Circumference --      Peak Flow --      Pain Score 01/01/21 0918 7     Pain Loc --      Pain  Edu? --      Excl. in [place]? --    Constitutional: Alert and oriented. Well appearing and in no distress. Eyes: Normal exam ENT      Head: Normocephalic and atraumatic.      Mouth/Throat: Mucous membranes are moist. Cardiovascular: Normal rate, regular rhythm.  Respiratory: Normal respiratory effort without tachypnea nor retractions. Breath sounds are clear.  Chest wall is nontender to palpation. Gastrointestinal: Soft and nontender. No distention.   Musculoskeletal: Nontender with normal range of motion in all extremities. No lower extremity tenderness or edema. Neurologic:  Normal speech and language. No gross focal neurologic deficits  Skin:  Skin is warm, dry and intact.  Psychiatric: Mood and affect are normal.   ____________________________________________    EKG  EKG viewed and interpreted by myself shows a normal sinus rhythm at 57 bpm with a narrow QRS, normal axis, normal intervals, nonspecific ST changes.  ____________________________________________   INITIAL IMPRESSION / ASSESSMENT AND PLAN / ED COURSE  Pertinent labs & imaging results that were available during my care of the patient were reviewed by me and considered in my medical decision making (see chart for details).   Patient presents emergency department for a nosebleed this morning.  Patient states since Saturday he has had an upper respiratory infection/congestion although states the cough is largely resolved.  States he has had intermittent nosebleeds of the left nostril.  This morning had another nosebleed from left nostril which is since stopped prior to arrival.  Appears well.  States he does have mild left-sided chest discomfort/congestion which has been present since Saturday as well.  No vomiting.  No fever.  No pleuritic pain.  Vital signs are reassuring.  Patient states he has never had a nosebleed before which is what concerned him and caused him to come to the emergency department today.  Given the  patient's symptoms we will check labs to ensure normal troponin.  EKG is reassuring.  I reviewed the patient's chart, had a chest x-ray 12/28/2020 that was clear.  Had a COVID/flu test 4/23 that was negative.  Patient's work-up is reassuring.  Troponin negative.  Platelets are normal.  Discussed with the patient using Vaseline in his nostrils twice daily for next 7 days we will also prescribe Afrin in case any further bleeding.  Patient agreeable to plan of care.  We will follow-up with his doctor.  Travis Huynh was evaluated in Emergency Department on 01/01/2021 for the symptoms described in the history of present illness. He was evaluated in the context of the global COVID-19 pandemic, which necessitated consideration that the patient might be at risk for infection with the SARS-CoV-2 virus that causes COVID-19. Institutional protocols  and algorithms that pertain to the evaluation of patients at risk for COVID-19 are in a state of rapid change based on information released by regulatory bodies including the CDC and federal and state organizations. These policies and algorithms were followed during the patient's care in the ED.  ____________________________________________   FINAL CLINICAL IMPRESSION(S) / ED DIAGNOSES  Congestion Nosebleed Chest pain   Harvest Dark, MD 01/01/21 1158

## 2021-01-27 ENCOUNTER — Ambulatory Visit (INDEPENDENT_AMBULATORY_CARE_PROVIDER_SITE_OTHER): Payer: 59 | Admitting: Family Medicine

## 2021-01-27 ENCOUNTER — Encounter: Payer: Self-pay | Admitting: Family Medicine

## 2021-01-27 ENCOUNTER — Other Ambulatory Visit: Payer: Self-pay

## 2021-01-27 VITALS — BP 128/70 | HR 57 | Temp 97.9°F | Resp 16 | Ht 67.0 in | Wt 157.4 lb

## 2021-01-27 DIAGNOSIS — Z23 Encounter for immunization: Secondary | ICD-10-CM

## 2021-01-27 DIAGNOSIS — Z125 Encounter for screening for malignant neoplasm of prostate: Secondary | ICD-10-CM

## 2021-01-27 DIAGNOSIS — R7303 Prediabetes: Secondary | ICD-10-CM | POA: Diagnosis not present

## 2021-01-27 DIAGNOSIS — Z0000 Encounter for general adult medical examination without abnormal findings: Secondary | ICD-10-CM | POA: Diagnosis not present

## 2021-01-27 DIAGNOSIS — I10 Essential (primary) hypertension: Secondary | ICD-10-CM | POA: Diagnosis not present

## 2021-01-27 DIAGNOSIS — N529 Male erectile dysfunction, unspecified: Secondary | ICD-10-CM

## 2021-01-27 DIAGNOSIS — R002 Palpitations: Secondary | ICD-10-CM | POA: Diagnosis not present

## 2021-01-27 DIAGNOSIS — Z13220 Encounter for screening for lipoid disorders: Secondary | ICD-10-CM

## 2021-01-27 LAB — HEMOGLOBIN A1C: Hgb A1c MFr Bld: 6.5 % (ref 4.6–6.5)

## 2021-01-27 LAB — LIPID PANEL
Cholesterol: 206 mg/dL — ABNORMAL HIGH (ref 0–200)
HDL: 45.8 mg/dL (ref >39.00)
LDL Cholesterol: 123 mg/dL — ABNORMAL HIGH (ref 0–99)
NonHDL: 159.86
Total CHOL/HDL Ratio: 4
Triglycerides: 184 mg/dL — ABNORMAL HIGH (ref 0.0–149.0)
VLDL: 36.8 mg/dL (ref 0.0–40.0)

## 2021-01-27 LAB — CBC WITH DIFFERENTIAL/PLATELET
Basophils Absolute: 0 10*3/uL (ref 0.0–0.1)
Basophils Relative: 0.9 % (ref 0.0–3.0)
Eosinophils Absolute: 0.1 10*3/uL (ref 0.0–0.7)
Eosinophils Relative: 2.2 % (ref 0.0–5.0)
HCT: 39.1 % (ref 39.0–52.0)
Hemoglobin: 12.8 g/dL — ABNORMAL LOW (ref 13.0–17.0)
Lymphocytes Relative: 41.3 % (ref 12.0–46.0)
Lymphs Abs: 2.2 10*3/uL (ref 0.7–4.0)
MCHC: 32.7 g/dL (ref 30.0–36.0)
MCV: 85.9 fl (ref 78.0–100.0)
Monocytes Absolute: 0.4 10*3/uL (ref 0.1–1.0)
Monocytes Relative: 8 % (ref 3.0–12.0)
Neutro Abs: 2.5 10*3/uL (ref 1.4–7.7)
Neutrophils Relative %: 47.6 % (ref 43.0–77.0)
Platelets: 215 10*3/uL (ref 150.0–400.0)
RBC: 4.55 Mil/uL (ref 4.22–5.81)
RDW: 14.7 % (ref 11.5–15.5)
WBC: 5.3 10*3/uL (ref 4.0–10.5)

## 2021-01-27 LAB — COMPREHENSIVE METABOLIC PANEL
ALT: 25 U/L (ref 0–53)
AST: 19 U/L (ref 0–37)
Albumin: 4.6 g/dL (ref 3.5–5.2)
Alkaline Phosphatase: 67 U/L (ref 39–117)
BUN: 18 mg/dL (ref 6–23)
CO2: 25 mEq/L (ref 19–32)
Calcium: 9.7 mg/dL (ref 8.4–10.5)
Chloride: 104 mEq/L (ref 96–112)
Creatinine, Ser: 1.07 mg/dL (ref 0.40–1.50)
GFR: 78.11 mL/min (ref >60.00)
Glucose, Bld: 92 mg/dL (ref 70–99)
Potassium: 4.3 mEq/L (ref 3.5–5.1)
Sodium: 139 mEq/L (ref 135–145)
Total Bilirubin: 0.6 mg/dL (ref 0.2–1.2)
Total Protein: 7.7 g/dL (ref 6.0–8.3)

## 2021-01-27 LAB — PSA: PSA: 1.13 ng/mL (ref 0.10–4.00)

## 2021-01-27 MED ORDER — HYDROCHLOROTHIAZIDE 12.5 MG PO CAPS: ORAL_CAPSULE | Freq: Every day | ORAL | 3 refills | Status: DC

## 2021-01-27 MED ORDER — METOPROLOL TARTRATE 25 MG PO TABS
ORAL_TABLET | ORAL | 3 refills | Status: DC
Start: 2021-01-27 — End: 2021-01-30

## 2021-01-27 MED ORDER — AMLODIPINE BESYLATE 5 MG PO TABS
ORAL_TABLET | Freq: Every day | ORAL | 3 refills | Status: DC
Start: 2021-01-27 — End: 2021-01-30

## 2021-01-27 NOTE — Progress Notes (Signed)
Subjective:  Patient ID: Travis Huynh, male    DOB: [birth year]  Age: 54 y.o. MRN: [id number]  CC:  Chief Complaint  Patient presents with  . Annual Exam    Pt doing well, no concerns at this time. Pt notes needs refills     HPI Travis Huynh presents for   Annual physical exam  Hypertension: Amlodipine 5 mg daily, hydrochlorothiazide 12.5 mg daily, metoprolol 25 mgTwice daily with episodic extra tablet if needed.  History of palpitations, followed by [facility] heart care, appointments in February.  Amlodipine was increased, as well as metoprolol as above for uncontrolled hypertension.  Zio patch 1 week monitoring performed.  Overall looked okay without significant ectopy.  Very brief runs of SVT which were benign. Overall better - rare episode. No associated CP/dyspnea.  Rare extar dose of metoprolol. Doing ok.  Home readings: none.  BP Readings from Last 3 Encounters:  01/27/21 128/70  01/01/21 (!) 147/94  12/28/20 125/85   Lab Results  Component Value Date   CREATININE 1.01 01/01/2021   Erectile dysfunction; Treated with Viagra last year. Did not take - has some at home.  Aware of risks/potential side effects.  Aware to use lowest effective dose. Side effects discussed (including but not limited to headache/flushing, blue discoloration of vision, possible vascular steal and risk of cardiac effects if underlying unknown coronary artery disease, and permanent sensorineural hearing loss). Understanding expressed.  Prediabetes: Physical job.  Sweet tea with lunch daily. Cutting back on soda. Fast food - avoiding.  Lab Results  Component Value Date   HGBA1C 5.8 (H) 02/21/2020   Wt Readings from Last 3 Encounters:  01/27/21 157 lb 6.4 oz (71.4 kg)  01/01/21 149 lb (67.6 kg)  11/04/20 160 lb (72.6 kg)    Cancer Screening: Colonoscopy 04/10/20. Fair prep, repeat 1 year recommended. Dr. Loletha Carrow. Advised pt.  Prostate:maternal uncle with Prostate CA.  No results found for:  PSA1, PSA The natural history of prostate cancer and ongoing controversy regarding screening and potential treatment outcomes of prostate cancer has been discussed with the patient. The meaning of a false positive PSA and a false negative PSA has been discussed. He indicates understanding of the limitations of this screening test and wishes to proceed with screening PSA testing and DRE. Limitations of testing discussed.   Immunization History  Administered Date(s) Administered  . PFIZER(Purple Top)SARS-COV-2 Vaccination 08/29/2019, 09/19/2019, 06/06/2020  . Tdap 02/21/2020  shingles vaccine: agrees to today.   Depression screen [facility] 2/9 01/27/2021 10/31/2020 03/04/2020 02/21/2020  Decreased Interest 0 0 0 0  Down, Depressed, Hopeless 0 0 0 0  PHQ - 2 Score 0 0 0 0  Altered sleeping 0 - - -  Tired, decreased energy 0 - - -  Change in appetite 0 - - -  Feeling bad or failure about yourself  0 - - -  Trouble concentrating 0 - - -  Moving slowly or fidgety/restless 0 - - -  Suicidal thoughts 0 - - -  PHQ-9 Score 0 - - -    Hearing Screening   125Hz  250Hz  500Hz  1000Hz  2000Hz  3000Hz  4000Hz  6000Hz  8000Hz   Right ear:           Left ear:             Visual Acuity Screening   Right eye Left eye Both eyes  Without correction:     With correction: 20/25 20/25-1 20/25  eye care in [place]. Yearly visits - glasses.   Dental: every  year.   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   Alcohol: 2 drinks past 3 weeks.   Nicotine addiction:  Cut back to 1 cigarette every 2 weeks.   History There are no problems to display for this patient.  Past Medical History:  Diagnosis Date  . Hypertension   . Palpitations    Past Surgical History:  Procedure Laterality Date  . MOUTH SURGERY     No Known Allergies Prior to Admission medications   Medication Sig Start Date End Date Taking? Authorizing Provider  amLODipine (NORVASC) 5 MG tablet TAKE 1  TABLET BY MOUTH DAILY 11/04/20 11/04/21 Yes Freada Bergeron, MD  hydrochlorothiazide (MICROZIDE) 12.5 MG capsule TAKE 1 CAPSULE BY MOUTH DAILY 10/31/20 10/31/21 Yes Just, Laurita Quint, FNP  metoprolol tartrate (LOPRESSOR) 25 MG tablet TAKE 1 TABLET BY MOUTH 2 TIMES DAILY. TAKE 1 EXTRA TABLET A DAY ONLY AS NEEDED 11/04/20 11/04/21 Yes Freada Bergeron, MD   Social History   Socioeconomic History  . Marital status: Married    Spouse name: Not on file  . Number of children: Not on file  . Years of education: Not on file  . Highest education level: Not on file  Occupational History  . Not on file  Tobacco Use  . Smoking status: Current Every Day Smoker    Packs/day: 0.25    Types: Cigarettes  . Smokeless tobacco: Never Used  . Tobacco comment: pt smokes about 2 cigarettes a day   Vaping Use  . Vaping Use: Never used  Substance and Sexual Activity  . Alcohol use: Yes    Alcohol/week: 1.0 standard drink    Types: 1 Cans of beer per week    Comment: occasionally  . Drug use: Never  . Sexual activity: Yes  Other Topics Concern  . Not on file  Social History Narrative  . Not on file   Social Determinants of Health   Financial Resource Strain: Not on file  Food Insecurity: Not on file  Transportation Needs: Not on file  Physical Activity: Not on file  Stress: Not on file  Social Connections: Not on file  Intimate Partner Violence: Not on file    Review of Systems 13 point review of systems per patient health survey noted.  Negative other than as indicated above or in HPI.    Objective:   Vitals:   01/27/21 0956  BP: 128/70  Pulse: (!) 57  Resp: 16  Temp: 97.9 F (36.6 C)  TempSrc: Temporal  SpO2: 99%  Weight: 157 lb 6.4 oz (71.4 kg)  Height: 5\' 7"  (1.702 m)     Physical Exam Vitals reviewed.  Constitutional:      Appearance: He is well-developed.  HENT:     Head: Normocephalic and atraumatic.     Right Ear: External ear normal.     Left Ear: External ear  normal.  Eyes:     Conjunctiva/sclera: Conjunctivae normal.     Pupils: Pupils are equal, round, and reactive to light.  Neck:     Thyroid: No thyromegaly.     Vascular: No carotid bruit or JVD.  Cardiovascular:     Rate and Rhythm: Normal rate and regular rhythm.     Heart sounds: Normal heart sounds. No murmur heard.   Pulmonary:     Effort: Pulmonary effort is normal. No respiratory distress.     Breath sounds: Normal breath sounds. No wheezing or rales.  Abdominal:  General: There is no distension.     Palpations: Abdomen is soft.     Tenderness: There is no abdominal tenderness.     Hernia: There is no hernia in the left inguinal area or right inguinal area.  Genitourinary:    Prostate: Normal. Not enlarged and no nodules present (unable to palpate apex, but distal prostate without apparent nodule. ).  Musculoskeletal:        General: No tenderness. Normal range of motion.     Cervical back: Normal range of motion and neck supple.  Lymphadenopathy:     Cervical: No cervical adenopathy.  Skin:    General: Skin is warm and dry.  Neurological:     Mental Status: He is alert and oriented to person, place, and time.     Deep Tendon Reflexes: Reflexes are normal and symmetric.  Psychiatric:        Behavior: Behavior normal.        Assessment & Plan:  Kameren Pargas is a 55 y.o. male . Annual physical exam  - -anticipatory guidance as below in AVS, screening labs above. Health maintenance items as above in HPI discussed/recommended as applicable.   Essential hypertension - Plan: amLODipine (NORVASC) 5 MG tablet, hydrochlorothiazide (MICROZIDE) 12.5 MG capsule, metoprolol tartrate (LOPRESSOR) 25 MG tablet Palpitations - Plan: CBC with Differential/Platelet, metoprolol tartrate (LOPRESSOR) 25 MG tablet  -  Stable, tolerating current regimen. Medications refilled. Labs pending as above.  Zio patch results and cardiology eval noted, improved palpitations.  Option of  additional metoprolol dose discussed as recommended by cardiology.  Erectile dysfunction, unspecified erectile dysfunction type  -Has Viagra as needed, risks/precautions discussed as above, lowest effective dose.  Prediabetes - Plan: Hemoglobin A1c  -Huynh A1c, has cut back on soda, may also need to avoid sweet tea/other sugar containing beverages.  Screening for hyperlipidemia - Plan: Comprehensive metabolic panel, Lipid panel  Screening for malignant neoplasm of prostate - Plan: PSA  -Risk/benefits/limitations discussed as above.  Meds ordered this encounter  Medications  . amLODipine (NORVASC) 5 MG tablet    Sig: TAKE 1 TABLET BY MOUTH DAILY    Dispense:  90 tablet    Refill:  3  . hydrochlorothiazide (MICROZIDE) 12.5 MG capsule    Sig: TAKE 1 CAPSULE BY MOUTH DAILY    Dispense:  90 capsule    Refill:  3  . metoprolol tartrate (LOPRESSOR) 25 MG tablet    Sig: TAKE 1 TABLET BY MOUTH 2 TIMES DAILY. TAKE 1 EXTRA TABLET A DAY ONLY AS NEEDED    Dispense:  90 tablet    Refill:  3   Patient Instructions  Extra dose of metoprolol if needed for palpitations.  Return to the clinic or go to the nearest emergency room if any of your symptoms worsen or new symptoms occur.  Schedule colonoscopy with Dr. Loletha Carrow in August.  No change in blood pressure meds for now.  Repeat shingles vaccine in 2-6 months.   Keep up the good work with cutting back on alcohol and tobacco! Thanks for coming in today.   Keeping you healthy  Get these tests  Blood pressure- Have your blood pressure checked once a year by your healthcare provider.  Normal blood pressure is 120/80  Weight- Have your body mass index (BMI) calculated to screen for obesity.  BMI is a measure of body fat based on height and weight. You can also calculate your own BMI at [email or web address].  Cholesterol- Have your cholesterol checked every year.  Diabetes-  Have your blood sugar checked regularly if you have high blood  pressure, high cholesterol, have a family history of diabetes or if you are overweight.  Screening for Colon Cancer- Colonoscopy starting at age 7.  Screening may begin sooner depending on your family history and other health conditions. Follow up colonoscopy as directed by your Gastroenterologist.  Screening for Prostate Cancer- Both blood work (PSA) and a rectal exam help screen for Prostate Cancer.  Screening begins at age 34 with African-American men and at age 88 with Caucasian men.  Screening may begin sooner depending on your family history.   Take these medicines  Flu shot- Every fall.  Tetanus- Every 10 years.  shingrix-  Second dose in 2-6 months to help prevent shingles.   Pneumonia shot- Once after the age of 73; if you are younger than 52, ask your healthcare provider if you need a Pneumonia shot.  Take these steps  Don't smoke- If you do smoke, talk to your doctor about quitting.  For tips on how to quit, go to www.smokefree.gov or call 1-800-QUIT-NOW.  Be physically active- Exercise 5 days a week for at least 30 minutes.  If you are not already physically active start slow and gradually work up to 30 minutes of moderate physical activity.  Examples of moderate activity include walking briskly, mowing the yard, dancing, swimming, bicycling, etc.  Eat a healthy diet- Eat a variety of healthy food such as fruits, vegetables, low fat milk, low fat cheese, yogurt, lean meant, poultry, fish, beans, tofu, etc. For more information go to www.thenutritionsource.org  Drink alcohol in moderation- Limit alcohol intake to less than two drinks a day. Never drink and drive.  Dentist- Brush and floss twice daily; visit your dentist twice a year.  Depression- Your emotional health is as important as your physical health. If you're feeling down, or losing interest in things you would normally enjoy please talk to your healthcare provider.  Eye exam- Visit your eye doctor every  year.  Safe sex- If you may be exposed to a sexually transmitted infection, use a condom.  Seat belts- Seat belts can save your life; always wear one.  Smoke/Carbon Monoxide detectors- These detectors need to be installed on the appropriate level of your home.  Replace batteries at least once a year.  Skin cancer- When out in the sun, cover up and use sunscreen 15 SPF or higher.  Violence- If anyone is threatening you, please tell your healthcare provider.  Living Will/ Health care power of attorney- Speak with your healthcare provider and family.     Signed, Merri Ray, MD [facility]

## 2021-01-27 NOTE — Patient Instructions (Addendum)
Extra dose of metoprolol if needed for palpitations.  Return to the clinic or go to the nearest emergency room if any of your symptoms worsen or new symptoms occur.  Schedule colonoscopy with Dr. Loletha Carrow in August.  No change in blood pressure meds for now.  Repeat shingles vaccine in 2-6 months.   Keep up the good work with cutting back on alcohol and tobacco! Thanks for coming in today.   Keeping you healthy  Get these tests  Blood pressure- Have your blood pressure checked once a year by your healthcare provider.  Normal blood pressure is 120/80  Weight- Have your body mass index (BMI) calculated to screen for obesity.  BMI is a measure of body fat based on height and weight. You can also calculate your own BMI at [email or web address].  Cholesterol- Have your cholesterol checked every year.  Diabetes- Have your blood sugar checked regularly if you have high blood pressure, high cholesterol, have a family history of diabetes or if you are overweight.  Screening for Colon Cancer- Colonoscopy starting at age 52.  Screening may begin sooner depending on your family history and other health conditions. Follow up colonoscopy as directed by your Gastroenterologist.  Screening for Prostate Cancer- Both blood work (PSA) and a rectal exam help screen for Prostate Cancer.  Screening begins at age 59 with African-American men and at age 40 with Caucasian men.  Screening may begin sooner depending on your family history.   Take these medicines  Flu shot- Every fall.  Tetanus- Every 10 years.  shingrix-  Second dose in 2-6 months to help prevent shingles.   Pneumonia shot- Once after the age of 49; if you are younger than 60, ask your healthcare provider if you need a Pneumonia shot.  Take these steps  Don't smoke- If you do smoke, talk to your doctor about quitting.  For tips on how to quit, go to www.smokefree.gov or call 1-800-QUIT-NOW.  Be physically active- Exercise 5 days a week  for at least 30 minutes.  If you are not already physically active start slow and gradually work up to 30 minutes of moderate physical activity.  Examples of moderate activity include walking briskly, mowing the yard, dancing, swimming, bicycling, etc.  Eat a healthy diet- Eat a variety of healthy food such as fruits, vegetables, low fat milk, low fat cheese, yogurt, lean meant, poultry, fish, beans, tofu, etc. For more information go to www.thenutritionsource.org  Drink alcohol in moderation- Limit alcohol intake to less than two drinks a day. Never drink and drive.  Dentist- Brush and floss twice daily; visit your dentist twice a year.  Depression- Your emotional health is as important as your physical health. If you're feeling down, or losing interest in things you would normally enjoy please talk to your healthcare provider.  Eye exam- Visit your eye doctor every year.  Safe sex- If you may be exposed to a sexually transmitted infection, use a condom.  Seat belts- Seat belts can save your life; always wear one.  Smoke/Carbon Monoxide detectors- These detectors need to be installed on the appropriate level of your home.  Replace batteries at least once a year.  Skin cancer- When out in the sun, cover up and use sunscreen 15 SPF or higher.  Violence- If anyone is threatening you, please tell your healthcare provider.  Living Will/ Health care power of attorney- Speak with your healthcare provider and family.

## 2021-01-27 NOTE — Addendum Note (Signed)
Addended by: Merri Ray R on: 01/27/2021 11:16 AM   Modules accepted: Orders

## 2021-01-29 NOTE — Progress Notes (Signed)
Cardiology Office Note:    Date:  01/30/2021   ID:  Travis Huynh, DOB July 07, 1966, MRN [id number]  PCP:  Wendie Agreste, MD   [place]  Cardiologist:  None  Advanced Practice Provider:  No care team member to display Electrophysiologist:  None   Referring MD: Wendie Agreste, MD     History of Present Illness:    Travis Huynh is a 55 y.o. male with a hx of HTN and anxiety who presents to clinic for follow-up of palpitations.  Patient was seen in clinic on 11/04/20 where he was having episodes of palpitations. Usually symptoms correlated with stress. Cardiac monitor 11/22/20 showed 2 runs of nonsustained SVT with longest lasting 13 beats. No other significant ectopy, VT or Afib.  The patient states that he overall feels improved since starting the metoprolol, but continues to have episodes of palpitations that wake him up at night. These episodes last up to 91minutes and resolve after taking the metoprolol. He has some left arm discomfort when the palpitations occur which he attributes to being anxious about having the palpitations. No exertional chest pain or SOB. He does not typically notice the palpitations during the daytime. Finds that they are worse when skips his metoprolol dosing. Otherwise, no SOB, LE edema orthopnea, or PND.   Of note, he is drinking sweet tea throughout the day and does not drink as much water. Occasionally smokes ciagrettes as well.  Family history notable for mother with CAD at age 74. No known arrhythmias or SCD in the family.  Past Medical History:  Diagnosis Date  . Hypertension   . Palpitations     Past Surgical History:  Procedure Laterality Date  . MOUTH SURGERY      Current Medications: Current Meds  Medication Sig  . metoprolol succinate (TOPROL XL) 25 MG 24 hr tablet Take 1 tablet (25 mg total) by mouth at bedtime.  . metoprolol tartrate (LOPRESSOR) 25 MG tablet Take 1 tablet (25 mg total) by mouth 2  (two) times daily as needed (palpitations).  . [DISCONTINUED] amLODipine (NORVASC) 5 MG tablet TAKE 1 TABLET BY MOUTH DAILY  . [DISCONTINUED] hydrochlorothiazide (MICROZIDE) 12.5 MG capsule TAKE 1 CAPSULE BY MOUTH DAILY  . [DISCONTINUED] metoprolol tartrate (LOPRESSOR) 25 MG tablet TAKE 1 TABLET BY MOUTH 2 TIMES DAILY. TAKE 1 EXTRA TABLET A DAY ONLY AS NEEDED     Allergies:   Patient has no known allergies.   Social History   Socioeconomic History  . Marital status: Married    Spouse name: Not on file  . Number of children: Not on file  . Years of education: Not on file  . Highest education level: Not on file  Occupational History  . Not on file  Tobacco Use  . Smoking status: Current Every Day Smoker    Packs/day: 0.25    Types: Cigarettes  . Smokeless tobacco: Never Used  . Tobacco comment: pt smokes about 2 cigarettes a day   Vaping Use  . Vaping Use: Never used  Substance and Sexual Activity  . Alcohol use: Yes    Alcohol/week: 1.0 standard drink    Types: 1 Cans of beer per week    Comment: occasionally  . Drug use: Never  . Sexual activity: Yes  Other Topics Concern  . Not on file  Social History Narrative  . Not on file   Social Determinants of Health   Financial Resource Strain: Not on file  Food Insecurity: Not on  file  Transportation Needs: Not on file  Physical Activity: Not on file  Stress: Not on file  Social Connections: Not on file     Family History: The patient's family history includes Prostate cancer in his maternal uncle. There is no history of Colon cancer, Colon polyps, Esophageal cancer, or Stomach cancer.  ROS:   Please see the history of present illness.    Review of Systems  Constitutional: Negative for chills and fever.  HENT: Negative for sore throat.   Respiratory: Negative for shortness of breath.   Cardiovascular: Positive for palpitations. Negative for chest pain, orthopnea, claudication, leg swelling and PND.   Gastrointestinal: Negative for nausea and vomiting.  Genitourinary: Negative for dysuria.  Musculoskeletal: Negative for falls.  Neurological: Negative for dizziness and loss of consciousness.  Psychiatric/Behavioral: Negative for depression.    EKGs/Labs/Other Studies Reviewed:    The following studies were reviewed today: Cardiac Monitor 11/22/20:  Patch wear time was 6 days and 13 hours.  Predominant rhythm was NSR with average HR 59bpm; ranging from 40bpm-132bpm  There were 2 runs of SVT with fastest/longest interval 13 beats with max rate of 132bpm  Rare SVE <1%, rare PVCs <1%  No afib, VT, or significant pauses  Patient triggered event correlates with NSR.    Patch Wear Time:  6 days and 13 hours (2022-03-03T19:07:46-0500 to 2022-03-10T09:06:11-498)  Patient had a min HR of 40 bpm, max HR of 132 bpm, and avg HR of 59 bpm. Predominant underlying rhythm was Sinus Rhythm. 2 Supraventricular Tachycardia runs occurred, the run with the fastest interval lasting 13 beats with a max rate of 132 bpm (avg 122  bpm); the run with the fastest interval was also the longest. Isolated SVEs were rare (<1.0%), SVE Couplets were rare (<1.0%), and SVE Triplets were rare (<1.0%). Isolated VEs were rare (<1.0%), and no VE Couplets or VE Triplets were present.    Recent Labs: 02/21/2020: TSH 1.470 10/31/2020: Magnesium 1.8 01/27/2021: ALT 25; BUN 18; Creatinine, Ser 1.07; Hemoglobin 12.8; Platelets 215.0; Potassium 4.3; Sodium 139  Recent Lipid Panel    Component Value Date/Time   CHOL 206 (H) 01/27/2021 1042   CHOL 208 (H) 02/21/2020 1058   TRIG 184.0 (H) 01/27/2021 1042   HDL 45.80 01/27/2021 1042   HDL 51 02/21/2020 1058   CHOLHDL 4 01/27/2021 1042   VLDL 36.8 01/27/2021 1042   LDLCALC 123 (H) 01/27/2021 1042   LDLCALC 134 (H) 02/21/2020 1058      Physical Exam:    VS:  BP 126/78   Pulse (!) 53   Ht 5\' 7"  (1.702 m)   Wt 158 lb 12.8 oz (72 kg)   SpO2 99%   BMI 24.87 kg/m      Wt Readings from Last 3 Encounters:  01/30/21 158 lb 12.8 oz (72 kg)  01/27/21 157 lb 6.4 oz (71.4 kg)  01/01/21 149 lb (67.6 kg)     GEN:  Comfortable, NAD HEENT: Normal NECK: No JVD; No carotid bruits CARDIAC: RRR, soft systolic murmur.  RESPIRATORY:  Clear to auscultation without rales, wheezing or rhonchi  ABDOMEN: Soft, non-tender, non-distended MUSCULOSKELETAL:  No edema; No deformity  SKIN: Warm and dry NEUROLOGIC:  Alert and oriented x 3 PSYCHIATRIC:  Normal affect   ASSESSMENT:    1. Palpitations   2. Essential hypertension   3. Mixed hyperlipidemia   4. Tobacco abuse    PLAN:    In order of problems listed above:  #Palpitations: Patient with intermittent palpitations that  has been ongoing for the past several months. Symptoms usually triggered by stress and if skips doses of metoprolol. Cardiac monitor showed 2 brief episodes of SVT with no other significant arrhythmias or ectopy. -Start metoprolol succinate 25mg  XL to take at night -Change metoprolol tartrate 25mg  to BID prn for breakthrough palpitations -Increase water intake and cut back on sweet tea as likely making palpitations worse -Discussed how anxiety can contribute to symptoms as well; if anxiety becomes burdernsome to him, he can ask his PCP about appropriate treatment  #HTN: Very well controlled and at goal <120s/80s. -Continue amlodipine to 5mg  daily -Continue HCTZ 12.5mg  daily -Start metoprolol succinate 25mg  XL to take at night -Change metoprolol tartrate 25mg  to BID prn for breakthrough palpitations  #HLD: LDL 123 on 01/27/21. -Diet and lifestyle modifications as detailed below; no known CAD  #Tobacco Abuse: -Continue to encourage tobacco cessation; discussed how this may help with palpitations as well  Exercise recommendations: Goal of exercising for at least 30 minutes a day, at least 5 times per week.  Please exercise to a moderate exertion.  This means that while exercising it is  difficult to speak in full sentences, however you are not so short of breath that you feel you must stop, and not so comfortable that you can carry on a full conversation.  Exertion level should be approximately a 5/10, if 10 is the most exertion you can perform.  Diet recommendations: Recommend a heart healthy diet such as the Mediterranean diet.  This diet consists of plant based foods, healthy fats, lean meats, olive oil.  It suggests limiting the intake of simple carbohydrates such as white breads, pastries, and pastas.  It also limits the amount of red meat, wine, and dairy products such as cheese that one should consume on a daily basis.   Medication Adjustments/Labs and Tests Ordered: Current medicines are reviewed at length with the patient today.  Concerns regarding medicines are outlined above.  No orders of the defined types were placed in this encounter.  Meds ordered this encounter  Medications  . metoprolol succinate (TOPROL XL) 25 MG 24 hr tablet    Sig: Take 1 tablet (25 mg total) by mouth at bedtime.    Dispense:  90 tablet    Refill:  2  . metoprolol tartrate (LOPRESSOR) 25 MG tablet    Sig: Take 1 tablet (25 mg total) by mouth 2 (two) times daily as needed (palpitations).    Dispense:  60 tablet    Refill:  3  . hydrochlorothiazide (MICROZIDE) 12.5 MG capsule    Sig: TAKE 1 CAPSULE BY MOUTH DAILY    Dispense:  90 capsule    Refill:  3  . amLODipine (NORVASC) 5 MG tablet    Sig: TAKE 1 TABLET BY MOUTH DAILY    Dispense:  90 tablet    Refill:  3    Patient Instructions  Medication Instructions:   START TAKING METOPROLOL SUCCINATE (TOPROL XL) 25 MG BY MOUTH DAILY AT BEDTIME  TAKE YOUR METOPROLOL TARTRATE (LOPRESSOR) 25 MG BY MOUTH TWICE DAILY AS NEEDED FOR PALPITATIONS   *If you need a refill on your cardiac medications before your next appointment, please call your pharmacy*   Follow-Up:  4 MONTHS WITH AN EXTENDER IN THE OFFICE PER DR. Johney Frame        Signed, Freada Bergeron, MD  01/30/2021 9:14 AM    [place]

## 2021-01-30 ENCOUNTER — Ambulatory Visit (INDEPENDENT_AMBULATORY_CARE_PROVIDER_SITE_OTHER): Payer: 59 | Admitting: Cardiology

## 2021-01-30 ENCOUNTER — Other Ambulatory Visit: Payer: Self-pay

## 2021-01-30 ENCOUNTER — Other Ambulatory Visit (HOSPITAL_COMMUNITY): Payer: Self-pay

## 2021-01-30 ENCOUNTER — Encounter: Payer: Self-pay | Admitting: Cardiology

## 2021-01-30 VITALS — BP 126/78 | HR 53 | Ht 67.0 in | Wt 158.8 lb

## 2021-01-30 DIAGNOSIS — Z720 Tobacco use: Secondary | ICD-10-CM | POA: Diagnosis not present

## 2021-01-30 DIAGNOSIS — E782 Mixed hyperlipidemia: Secondary | ICD-10-CM

## 2021-01-30 DIAGNOSIS — I10 Essential (primary) hypertension: Secondary | ICD-10-CM | POA: Diagnosis not present

## 2021-01-30 DIAGNOSIS — R002 Palpitations: Secondary | ICD-10-CM | POA: Diagnosis not present

## 2021-01-30 MED ORDER — METOPROLOL SUCCINATE ER 25 MG PO TB24
25.0000 mg | ORAL_TABLET | Freq: Every day | ORAL | 2 refills | Status: DC
  Filled 2021-01-30: qty 90, 90d supply, fill #0

## 2021-01-30 MED ORDER — METOPROLOL TARTRATE 25 MG PO TABS
25.0000 mg | ORAL_TABLET | Freq: Two times a day (BID) | ORAL | 3 refills | Status: DC | PRN
  Filled 2021-01-30: qty 60, 30d supply, fill #0
  Filled 2021-04-01: qty 60, 30d supply, fill #1

## 2021-01-30 MED ORDER — AMLODIPINE BESYLATE 5 MG PO TABS
ORAL_TABLET | Freq: Every day | ORAL | 3 refills | Status: DC
  Filled 2021-01-30 – 2021-02-10 (×2): qty 90, 90d supply, fill #0

## 2021-01-30 MED ORDER — HYDROCHLOROTHIAZIDE 12.5 MG PO CAPS
ORAL_CAPSULE | Freq: Every day | ORAL | 3 refills | Status: DC
  Filled 2021-01-30 – 2021-02-11 (×2): qty 90, 90d supply, fill #0

## 2021-01-30 NOTE — Patient Instructions (Signed)
Medication Instructions:   START TAKING METOPROLOL SUCCINATE (TOPROL XL) 25 MG BY MOUTH DAILY AT BEDTIME  TAKE YOUR METOPROLOL TARTRATE (LOPRESSOR) 25 MG BY MOUTH TWICE DAILY AS NEEDED FOR PALPITATIONS   *If you need a refill on your cardiac medications before your next appointment, please call your pharmacy*   Follow-Up:  4 MONTHS WITH AN EXTENDER IN THE OFFICE PER DR. Johney Frame

## 2021-02-07 ENCOUNTER — Other Ambulatory Visit (HOSPITAL_COMMUNITY): Payer: Self-pay

## 2021-02-10 ENCOUNTER — Other Ambulatory Visit (HOSPITAL_COMMUNITY): Payer: Self-pay

## 2021-02-11 ENCOUNTER — Other Ambulatory Visit (HOSPITAL_COMMUNITY): Payer: Self-pay

## 2021-03-08 ENCOUNTER — Other Ambulatory Visit: Payer: Self-pay

## 2021-03-08 ENCOUNTER — Emergency Department: Payer: 59

## 2021-03-08 DIAGNOSIS — I10 Essential (primary) hypertension: Secondary | ICD-10-CM | POA: Insufficient documentation

## 2021-03-08 DIAGNOSIS — N179 Acute kidney failure, unspecified: Secondary | ICD-10-CM | POA: Diagnosis not present

## 2021-03-08 DIAGNOSIS — Z79899 Other long term (current) drug therapy: Secondary | ICD-10-CM | POA: Insufficient documentation

## 2021-03-08 DIAGNOSIS — M25462 Effusion, left knee: Secondary | ICD-10-CM | POA: Insufficient documentation

## 2021-03-08 DIAGNOSIS — M7989 Other specified soft tissue disorders: Secondary | ICD-10-CM | POA: Diagnosis not present

## 2021-03-08 DIAGNOSIS — R2242 Localized swelling, mass and lump, left lower limb: Secondary | ICD-10-CM | POA: Diagnosis not present

## 2021-03-08 DIAGNOSIS — R600 Localized edema: Secondary | ICD-10-CM | POA: Diagnosis not present

## 2021-03-08 DIAGNOSIS — I129 Hypertensive chronic kidney disease with stage 1 through stage 4 chronic kidney disease, or unspecified chronic kidney disease: Secondary | ICD-10-CM | POA: Diagnosis not present

## 2021-03-08 DIAGNOSIS — M25562 Pain in left knee: Secondary | ICD-10-CM | POA: Insufficient documentation

## 2021-03-08 DIAGNOSIS — M109 Gout, unspecified: Secondary | ICD-10-CM | POA: Diagnosis not present

## 2021-03-08 DIAGNOSIS — Z20822 Contact with and (suspected) exposure to covid-19: Secondary | ICD-10-CM | POA: Diagnosis not present

## 2021-03-08 DIAGNOSIS — M79662 Pain in left lower leg: Secondary | ICD-10-CM | POA: Diagnosis not present

## 2021-03-08 DIAGNOSIS — N182 Chronic kidney disease, stage 2 (mild): Secondary | ICD-10-CM | POA: Diagnosis not present

## 2021-03-08 DIAGNOSIS — F17210 Nicotine dependence, cigarettes, uncomplicated: Secondary | ICD-10-CM | POA: Insufficient documentation

## 2021-03-08 NOTE — ED Triage Notes (Signed)
Pt states left knee pain for 3 days. Pt complains of stiffness in knee. Pt with swelling noted to left calf. Pt states he is concerned he may have a blood clot in his leg. No known fever.

## 2021-03-09 ENCOUNTER — Emergency Department
Admission: EM | Admit: 2021-03-09 | Discharge: 2021-03-09 | Disposition: A | Discharge status: Home / Self Care | Payer: 59 | Source: Home / Self Care | Attending: Emergency Medicine | Admitting: Emergency Medicine

## 2021-03-09 ENCOUNTER — Emergency Department: Payer: 59

## 2021-03-09 DIAGNOSIS — F17210 Nicotine dependence, cigarettes, uncomplicated: Secondary | ICD-10-CM | POA: Insufficient documentation

## 2021-03-09 DIAGNOSIS — N182 Chronic kidney disease, stage 2 (mild): Secondary | ICD-10-CM | POA: Diagnosis not present

## 2021-03-09 DIAGNOSIS — Z20822 Contact with and (suspected) exposure to covid-19: Secondary | ICD-10-CM | POA: Insufficient documentation

## 2021-03-09 DIAGNOSIS — N179 Acute kidney failure, unspecified: Secondary | ICD-10-CM | POA: Insufficient documentation

## 2021-03-09 DIAGNOSIS — Z79899 Other long term (current) drug therapy: Secondary | ICD-10-CM | POA: Insufficient documentation

## 2021-03-09 DIAGNOSIS — M7989 Other specified soft tissue disorders: Secondary | ICD-10-CM | POA: Diagnosis not present

## 2021-03-09 DIAGNOSIS — R2242 Localized swelling, mass and lump, left lower limb: Secondary | ICD-10-CM | POA: Diagnosis not present

## 2021-03-09 DIAGNOSIS — M25562 Pain in left knee: Secondary | ICD-10-CM | POA: Diagnosis not present

## 2021-03-09 DIAGNOSIS — M109 Gout, unspecified: Secondary | ICD-10-CM | POA: Diagnosis not present

## 2021-03-09 DIAGNOSIS — R600 Localized edema: Secondary | ICD-10-CM | POA: Diagnosis not present

## 2021-03-09 DIAGNOSIS — I129 Hypertensive chronic kidney disease with stage 1 through stage 4 chronic kidney disease, or unspecified chronic kidney disease: Secondary | ICD-10-CM | POA: Diagnosis not present

## 2021-03-09 MED ORDER — CEPHALEXIN 500 MG PO CAPS
500.0000 mg | ORAL_CAPSULE | Freq: Four times a day (QID) | ORAL | 0 refills | Status: DC
  Filled 2021-03-12: qty 40, 10d supply, fill #0

## 2021-03-09 MED ORDER — SULFAMETHOXAZOLE-TRIMETHOPRIM 800-160 MG PO TABS
1.0000 | ORAL_TABLET | Freq: Once | ORAL | Status: AC
  Administered 2021-03-09: 1 via ORAL
  Filled 2021-03-09: qty 1

## 2021-03-09 MED ORDER — OXYCODONE-ACETAMINOPHEN 5-325 MG PO TABS
2.0000 | ORAL_TABLET | Freq: Once | ORAL | Status: AC
  Administered 2021-03-09: 2 via ORAL
  Filled 2021-03-09: qty 2

## 2021-03-09 MED ORDER — SULFAMETHOXAZOLE-TRIMETHOPRIM 800-160 MG PO TABS: 1.0000 | ORAL_TABLET | Freq: Two times a day (BID) | ORAL | 0 refills | Status: DC

## 2021-03-09 MED ORDER — IBUPROFEN 400 MG PO TABS
600.0000 mg | ORAL_TABLET | Freq: Once | ORAL | Status: AC
  Administered 2021-03-09: 600 mg via ORAL
  Filled 2021-03-09: qty 2

## 2021-03-09 MED ORDER — CEPHALEXIN 500 MG PO CAPS
500.0000 mg | ORAL_CAPSULE | Freq: Once | ORAL | Status: AC
  Administered 2021-03-09: 500 mg via ORAL
  Filled 2021-03-09: qty 1

## 2021-03-09 MED ORDER — HYDROCODONE-ACETAMINOPHEN 5-325 MG PO TABS: 2.0000 | ORAL_TABLET | Freq: Four times a day (QID) | ORAL | 0 refills | Status: DC | PRN

## 2021-03-09 NOTE — ED Notes (Signed)
Patient with complaint of swelling to left knee times four days. Patient with redness noted to left knee. Patient states that he has had similar swelling in the past but it has improved on it's own. Patient with 2+ pedal pulse.

## 2021-03-09 NOTE — ED Notes (Signed)
E-signature pad not working at this time. Patient verbalizes understanding of discharge instructions.

## 2021-03-09 NOTE — ED Provider Notes (Signed)
[facility] Emergency Department Provider Note  ____________________________________________   Event Date/Time   First MD Initiated Contact with Patient 03/09/21 0119     (approximate)  I have reviewed the triage vital signs and the nursing notes.   HISTORY  Chief Complaint Leg Pain    HPI Travis Huynh is a 55 y.o. male who presents for evaluation of pain in his left leg.  He is concerned he may have a blood clot.  He developed some pain and redness and swelling to the left side of his knee.  He said that it has been gradually getting worse over the last 3 days.  He has never had blood clots before but he knows that they can cause similar pain.  He has had some stiffness of the knee as well.  He did not have any specific injury although he says that it started the day after he was squatting down and bending down with his knees.  He does not do any job that requires him to be on his knees, such as work with carpet.  He has not had any penetrating injury to the knee.  No prior history of gout.  He denies fever, sore throat, chest pain, shortness of breath, nausea, vomiting, and abdominal pain.  He said that it actually feels better when he is standing up and worse when he lies down.    Past Medical History:  Diagnosis Date   Hypertension    Palpitations     There are no problems to display for this patient.   Past Surgical History:  Procedure Laterality Date   MOUTH SURGERY      Prior to Admission medications   Medication Sig Start Date End Date Taking? Authorizing Provider  cephALEXin (KEFLEX) 500 MG capsule Take 1 capsule (500 mg total) by mouth 4 (four) times daily. 03/09/21  Yes Hinda Kehr, MD  HYDROcodone-acetaminophen (NORCO/VICODIN) 5-325 MG tablet Take 2 tablets by mouth every 6 (six) hours as needed for moderate pain or severe pain. 03/09/21  Yes Hinda Kehr, MD  sulfamethoxazole-trimethoprim (BACTRIM DS) 800-160 MG tablet Take 1 tablet  by mouth 2 (two) times daily for 10 days. 03/09/21 03/19/21 Yes Hinda Kehr, MD  amLODipine (NORVASC) 5 MG tablet TAKE 1 TABLET BY MOUTH DAILY 01/30/21 01/30/22  Freada Bergeron, MD  hydrochlorothiazide (MICROZIDE) 12.5 MG capsule TAKE 1 CAPSULE BY MOUTH DAILY 01/30/21 01/30/22  Freada Bergeron, MD  metoprolol succinate (TOPROL XL) 25 MG 24 hr tablet Take 1 tablet (25 mg total) by mouth at bedtime. 01/30/21   Freada Bergeron, MD  metoprolol tartrate (LOPRESSOR) 25 MG tablet Take 1 tablet (25 mg total) by mouth 2 (two) times daily as needed (palpitations). 01/30/21   Freada Bergeron, MD    Allergies Patient has no known allergies.  Family History  Problem Relation Age of Onset   Prostate cancer Maternal Uncle    Colon cancer Neg Hx    Colon polyps Neg Hx    Esophageal cancer Neg Hx    Stomach cancer Neg Hx     Social History Social History   Tobacco Use   Smoking status: Every Day    Packs/day: 0.25    Pack years: 0.00    Types: Cigarettes   Smokeless tobacco: Never   Tobacco comments:    pt smokes about 2 cigarettes a day   Vaping Use   Vaping Use: Never used  Substance Use Topics   Alcohol use: Yes  Alcohol/week: 1.0 standard drink    Types: 1 Cans of beer per week    Comment: occasionally   Drug use: Never    Review of Systems Constitutional: No fever/chills Eyes: No visual changes. ENT: No sore throat. Cardiovascular: Denies chest pain. Respiratory: Denies shortness of breath. Gastrointestinal: No abdominal pain.  No nausea, no vomiting.  No diarrhea.  No constipation. Genitourinary: Negative for dysuria. Musculoskeletal: Pain in left leg and left knee. Integumentary: Negative for rash. Neurological: Negative for headaches, focal weakness or numbness.   ____________________________________________   PHYSICAL EXAM:  VITAL SIGNS: ED Triage Vitals  Enc Vitals Group     BP 03/08/21 2149 (!) 160/109     Pulse Rate 03/08/21 2149 67     Resp  03/08/21 2149 16     Temp 03/08/21 2149 98.9 F (37.2 C)     Temp Source 03/08/21 2149 Oral     SpO2 03/09/21 0016 98 %     Weight 03/08/21 2150 68 kg (150 lb)     Height 03/08/21 2150 1.702 m (5\' 7" )     Head Circumference --      Peak Flow --      Pain Score 03/08/21 2150 10     Pain Loc --      Pain Edu? --      Excl. in [place]? --     Constitutional: Alert and oriented.  Eyes: Conjunctivae are normal.  Head: Atraumatic. Nose: No congestion/rhinnorhea. Mouth/Throat: Patient is wearing a mask. Neck: No stridor.  No meningeal signs.   Cardiovascular: Normal rate, regular rhythm. Good peripheral circulation. Respiratory: Normal respiratory effort.  No retractions. Gastrointestinal: Soft and nontender. No distention.  Musculoskeletal: Patient has some swelling to the left and slightly superior aspect of his left knee.  No obvious effusion; the swelling and redness seems to be isolated to the superficial tissue above the knee.  Patient reports pain with any amount of palpation or manipulation. Neurologic:  Normal speech and language. No gross focal neurologic deficits are appreciated.  Skin:  Skin is warm, dry and intact. Psychiatric: Mood and affect are normal. Speech and behavior are normal.  ____________________________________________   LABS (all labs ordered are listed, but only abnormal results are displayed)  Labs Reviewed - No data to display ____________________________________________   RADIOLOGY I, Hinda Kehr, personally viewed and evaluated these images (plain radiographs) as part of my medical decision making, as well as reviewing the written report by the radiologist.  ED MD interpretation: No acute abnormalities identified on ultrasound.  X-rays shows some soft tissue edema.  Official radiology report(s): DG Knee 2 Views Left  Result Date: 03/09/2021 CLINICAL DATA:  Pain and swelling EXAM: LEFT KNEE - 1-2 VIEW COMPARISON:  None. FINDINGS: No evidence of  fracture, dislocation. No cortical erosion or destruction. Possible trace joint effusion. No evidence of arthropathy or other focal bone abnormality. Anterior knee subcutaneus soft tissue edema. No CT findings of subcutaneus soft tissue emphysema or organized fluid collection. IMPRESSION: 1. No acute displaced fracture or dislocation. 2. Anterior knee subcutaneus soft tissue edema. 3. Possible trace joint effusion. Electronically Signed   By: Iven Finn M.D.   On: 03/09/2021 03:35   US Venous Img Lower Unilateral Left  Result Date: 03/08/2021 CLINICAL DATA:  Calf and knee pain for 3 days. EXAM: LEFT LOWER EXTREMITY VENOUS DOPPLER ULTRASOUND TECHNIQUE: Gray-scale sonography with compression, as well as color and duplex ultrasound, were performed to evaluate the deep venous system(s) from the level of the  common femoral vein through the popliteal and proximal calf veins. COMPARISON:  None. FINDINGS: VENOUS Normal compressibility of the common femoral, superficial femoral, and popliteal veins, as well as the visualized calf veins. Visualized portions of profunda femoral vein and great saphenous vein unremarkable. No filling defects to suggest DVT on grayscale or color Doppler imaging. Doppler waveforms show normal direction of venous flow, normal respiratory plasticity and response to augmentation. Limited views of the contralateral common femoral vein are unremarkable. OTHER None. Limitations: none IMPRESSION: No evidence of left lower extremity DVT. Electronically Signed   By: Keith Rake M.D.   On: 03/08/2021 22:36    ____________________________________________   PROCEDURES   Procedure(s) performed (including Critical Care):  Procedures   ____________________________________________   INITIAL IMPRESSION / MDM / [place] / ED COURSE  As part of my medical decision making, I reviewed the following data within the [place] notes reviewed and  incorporated, Old chart reviewed, Radiograph reviewed , Notes from prior ED visits, and [place] Controlled Substance Database   Differential diagnosis includes, but is not limited to, bursitis, cellulitis, septic arthritis, gout.  Ultrasound negative for DVT.  Patient's vital signs are stable and within normal limits.  He is afebrile and has had no systemic symptoms.  His presentation does not appear exactly consistent with gout, it appears most consistent with a soft tissue infection around the knee although I do not know why exactly what is started.  He initially would not flex or extend his knee, but then after I walked out of the room to care for some other patients, I happened by and saw that he was standing up and walking around on it.  He said he feels better when he does that.  I talked with the patient about the risks and benefits of arthrocentesis and he does not want the procedure which I think is understandable and that it is not necessary at this time given the very low probability of septic arthritis.  I will treat the patient empirically for the possibility of cellulitis given that I personally reviewed the patient's imaging and agree with the radiologist's interpretation that there is anterior soft tissue swelling and that is consistent with the physical exam.  I will encourage close follow-up with orthopedics and the patient understands and agrees with the plan.     Clinical Course as of 03/09/21 0421  Sun Mar 09, 2021  0415 Of note, after I talked with the patient about the plan and he declined arthrocentesis, I also asked him if he wanted crutches and he said he did not, that he can walk okay.  This is also reassuring because of septic arthritis should not allow him to bear weight and ambulate. [CF]    Clinical Course User Index [CF] Hinda Kehr, MD     ____________________________________________  FINAL CLINICAL IMPRESSION(S) / ED DIAGNOSES  Final diagnoses:  Acute pain of  left knee     MEDICATIONS GIVEN DURING THIS VISIT:  Medications  sulfamethoxazole-trimethoprim (BACTRIM DS) 800-160 MG per tablet 1 tablet (has no administration in time range)  cephALEXin (KEFLEX) capsule 500 mg (has no administration in time range)  ibuprofen (ADVIL) tablet 600 mg (has no administration in time range)  oxyCODONE-acetaminophen (PERCOCET/ROXICET) 5-325 MG per tablet 2 tablet (2 tablets Oral Given 03/09/21 0241)     ED Discharge Orders          Ordered    sulfamethoxazole-trimethoprim (BACTRIM DS) 800-160 MG tablet  2 times  daily        03/09/21 0420    cephALEXin (KEFLEX) 500 MG capsule  4 times daily        03/09/21 0420    HYDROcodone-acetaminophen (NORCO/VICODIN) 5-325 MG tablet  Every 6 hours PRN        03/09/21 0420             Note:  This document was prepared using Dragon voice recognition software and may include unintentional dictation errors.   Hinda Kehr, MD 03/09/21 [phone]

## 2021-03-09 NOTE — Discharge Instructions (Addendum)
As we discussed, your x-rays and ultrasound were reassuring.  It is unclear at this point whether you are having an infectious process (such as a cellulitis around the left knee) or an inflammatory process which is leading to the pain you are experiencing.  We talked about performing an arthrocentesis, which is using a needle to withdraw fluid from the knee, but you would prefer to hold off on that at this time.  That seems reasonable, but we encourage you to take the full course of antibiotics prescribed and return immediately to the emergency department if you develop any new or worsening symptoms that concern you, including but not limited to worsening swelling, worsening pain, fever, inability to bear weight, etc.

## 2021-03-10 ENCOUNTER — Observation Stay
Admission: EM | Admit: 2021-03-10 | Discharge: 2021-03-12 | Disposition: A | Discharge status: Home / Self Care | Payer: 59 | Attending: Emergency Medicine | Admitting: Emergency Medicine

## 2021-03-10 ENCOUNTER — Encounter: Payer: Self-pay | Admitting: Internal Medicine

## 2021-03-10 ENCOUNTER — Other Ambulatory Visit: Payer: Self-pay

## 2021-03-10 ENCOUNTER — Observation Stay: Payer: 59

## 2021-03-10 DIAGNOSIS — N182 Chronic kidney disease, stage 2 (mild): Secondary | ICD-10-CM | POA: Diagnosis not present

## 2021-03-10 DIAGNOSIS — M10362 Gout due to renal impairment, left knee: Secondary | ICD-10-CM | POA: Diagnosis not present

## 2021-03-10 DIAGNOSIS — R600 Localized edema: Secondary | ICD-10-CM | POA: Diagnosis not present

## 2021-03-10 DIAGNOSIS — M7042 Prepatellar bursitis, left knee: Secondary | ICD-10-CM | POA: Diagnosis not present

## 2021-03-10 DIAGNOSIS — M25562 Pain in left knee: Secondary | ICD-10-CM | POA: Diagnosis not present

## 2021-03-10 DIAGNOSIS — N179 Acute kidney failure, unspecified: Secondary | ICD-10-CM | POA: Diagnosis not present

## 2021-03-10 DIAGNOSIS — M109 Gout, unspecified: Secondary | ICD-10-CM | POA: Diagnosis not present

## 2021-03-10 DIAGNOSIS — F17210 Nicotine dependence, cigarettes, uncomplicated: Secondary | ICD-10-CM | POA: Diagnosis not present

## 2021-03-10 DIAGNOSIS — M7989 Other specified soft tissue disorders: Secondary | ICD-10-CM | POA: Diagnosis not present

## 2021-03-10 DIAGNOSIS — I129 Hypertensive chronic kidney disease with stage 1 through stage 4 chronic kidney disease, or unspecified chronic kidney disease: Secondary | ICD-10-CM | POA: Diagnosis not present

## 2021-03-10 DIAGNOSIS — Z20822 Contact with and (suspected) exposure to covid-19: Secondary | ICD-10-CM | POA: Diagnosis not present

## 2021-03-10 DIAGNOSIS — Z79899 Other long term (current) drug therapy: Secondary | ICD-10-CM | POA: Diagnosis not present

## 2021-03-10 DIAGNOSIS — R2242 Localized swelling, mass and lump, left lower limb: Secondary | ICD-10-CM | POA: Diagnosis not present

## 2021-03-10 DIAGNOSIS — I10 Essential (primary) hypertension: Secondary | ICD-10-CM | POA: Diagnosis present

## 2021-03-10 DIAGNOSIS — M25462 Effusion, left knee: Secondary | ICD-10-CM | POA: Diagnosis not present

## 2021-03-10 DIAGNOSIS — Z720 Tobacco use: Secondary | ICD-10-CM | POA: Diagnosis present

## 2021-03-10 HISTORY — DX: Chronic kidney disease, stage 2 (mild): N18.2

## 2021-03-10 LAB — CBC WITH DIFFERENTIAL/PLATELET
Abs Immature Granulocytes: 0.02 10*3/uL (ref 0.00–0.07)
Basophils Absolute: 0 10*3/uL (ref 0.0–0.1)
Basophils Relative: 0 %
Eosinophils Absolute: 0 10*3/uL (ref 0.0–0.5)
Eosinophils Relative: 0 %
HCT: 36.1 % — ABNORMAL LOW (ref 39.0–52.0)
Hemoglobin: 12.2 g/dL — ABNORMAL LOW (ref 13.0–17.0)
Immature Granulocytes: 0 %
Lymphocytes Relative: 29 %
Lymphs Abs: 2 10*3/uL (ref 0.7–4.0)
MCH: 28.4 pg (ref 26.0–34.0)
MCHC: 33.8 g/dL (ref 30.0–36.0)
MCV: 84 fL (ref 80.0–100.0)
Monocytes Absolute: 0.6 10*3/uL (ref 0.1–1.0)
Monocytes Relative: 10 %
Neutro Abs: 4 10*3/uL (ref 1.7–7.7)
Neutrophils Relative %: 61 %
Platelets: 227 10*3/uL (ref 150–400)
RBC: 4.3 MIL/uL (ref 4.22–5.81)
RDW: 14.6 % (ref 11.5–15.5)
WBC: 6.7 10*3/uL (ref 4.0–10.5)
nRBC: 0 % (ref 0.0–0.2)

## 2021-03-10 LAB — COMPREHENSIVE METABOLIC PANEL
ALT: 20 U/L (ref 0–44)
AST: 22 U/L (ref 15–41)
Albumin: 4.2 g/dL (ref 3.5–5.0)
Alkaline Phosphatase: 56 U/L (ref 38–126)
Anion gap: 10 (ref 5–15)
BUN: 24 mg/dL — ABNORMAL HIGH (ref 6–20)
CO2: 22 mmol/L (ref 22–32)
Calcium: 9.1 mg/dL (ref 8.9–10.3)
Chloride: 105 mmol/L (ref 98–111)
Creatinine, Ser: 1.92 mg/dL — ABNORMAL HIGH (ref 0.61–1.24)
GFR, Estimated: 41 mL/min — ABNORMAL LOW (ref >60)
Glucose, Bld: 112 mg/dL — ABNORMAL HIGH (ref 70–99)
Potassium: 3.7 mmol/L (ref 3.5–5.1)
Sodium: 137 mmol/L (ref 135–145)
Total Bilirubin: 0.7 mg/dL (ref 0.3–1.2)
Total Protein: 8.3 g/dL — ABNORMAL HIGH (ref 6.5–8.1)

## 2021-03-10 LAB — URIC ACID: Uric Acid, Serum: 10.4 mg/dL — ABNORMAL HIGH (ref 3.7–8.6)

## 2021-03-10 LAB — RESP PANEL BY RT-PCR (FLU A&B, COVID) ARPGX2
Influenza A by PCR: NEGATIVE
Influenza B by PCR: NEGATIVE
SARS Coronavirus 2 by RT PCR: NEGATIVE

## 2021-03-10 LAB — SEDIMENTATION RATE: Sed Rate: 60 mm/hr — ABNORMAL HIGH (ref 0–20)

## 2021-03-10 LAB — C-REACTIVE PROTEIN: CRP: 1.5 mg/dL — ABNORMAL HIGH (ref <1.0)

## 2021-03-10 LAB — PROTIME-INR
INR: 1.1 (ref 0.8–1.2)
Prothrombin Time: 14.5 seconds (ref 11.4–15.2)

## 2021-03-10 LAB — HIV ANTIBODY (ROUTINE TESTING W REFLEX): HIV Screen 4th Generation wRfx: NONREACTIVE

## 2021-03-10 MED ORDER — PREDNISONE 20 MG PO TABS: 40.0000 mg | ORAL_TABLET | Freq: Every day | ORAL | Status: DC

## 2021-03-10 MED ORDER — KETOROLAC TROMETHAMINE 30 MG/ML IJ SOLN
15.0000 mg | Freq: Once | INTRAMUSCULAR | Status: AC
  Administered 2021-03-10: 15 mg via INTRAVENOUS
  Filled 2021-03-10: qty 1

## 2021-03-10 MED ORDER — AMLODIPINE BESYLATE 10 MG PO TABS
10.0000 mg | ORAL_TABLET | Freq: Every day | ORAL | Status: DC
  Administered 2021-03-10 – 2021-03-12 (×3): 10 mg via ORAL
  Filled 2021-03-10 (×3): qty 1

## 2021-03-10 MED ORDER — ONDANSETRON HCL 4 MG/2ML IJ SOLN: 4.0000 mg | Freq: Three times a day (TID) | INTRAMUSCULAR | Status: DC | PRN

## 2021-03-10 MED ORDER — METOPROLOL TARTRATE 25 MG PO TABS: 25.0000 mg | ORAL_TABLET | Freq: Two times a day (BID) | ORAL | Status: DC | PRN

## 2021-03-10 MED ORDER — DEXAMETHASONE SODIUM PHOSPHATE 10 MG/ML IJ SOLN
10.0000 mg | Freq: Once | INTRAMUSCULAR | Status: AC
  Administered 2021-03-10: 10 mg via INTRAVENOUS
  Filled 2021-03-10: qty 1

## 2021-03-10 MED ORDER — METHYLPREDNISOLONE SODIUM SUCC 40 MG IJ SOLR
40.0000 mg | Freq: Every day | INTRAMUSCULAR | Status: DC
  Administered 2021-03-11 – 2021-03-12 (×2): 40 mg via INTRAVENOUS
  Filled 2021-03-10 (×2): qty 1

## 2021-03-10 MED ORDER — ACETAMINOPHEN 325 MG PO TABS
650.0000 mg | ORAL_TABLET | Freq: Four times a day (QID) | ORAL | Status: DC | PRN
  Filled 2021-03-10: qty 2

## 2021-03-10 MED ORDER — CEPHALEXIN 500 MG PO CAPS
500.0000 mg | ORAL_CAPSULE | Freq: Four times a day (QID) | ORAL | Status: DC
  Administered 2021-03-10: 500 mg via ORAL
  Filled 2021-03-10: qty 1

## 2021-03-10 MED ORDER — LIDOCAINE-PRILOCAINE 2.5-2.5 % EX CREA
TOPICAL_CREAM | Freq: Once | CUTANEOUS | Status: AC
  Filled 2021-03-10: qty 5

## 2021-03-10 MED ORDER — LORAZEPAM 2 MG/ML IJ SOLN
INTRAMUSCULAR | Status: AC
  Administered 2021-03-10: 1 mg via INTRAVENOUS
  Filled 2021-03-10: qty 1

## 2021-03-10 MED ORDER — HYDRALAZINE HCL 20 MG/ML IJ SOLN: 5.0000 mg | INTRAMUSCULAR | Status: DC | PRN

## 2021-03-10 MED ORDER — OXYCODONE-ACETAMINOPHEN 5-325 MG PO TABS
1.0000 | ORAL_TABLET | ORAL | Status: DC | PRN
  Administered 2021-03-10 – 2021-03-12 (×9): 1 via ORAL
  Filled 2021-03-10 (×9): qty 1

## 2021-03-10 MED ORDER — METHYLPREDNISOLONE SODIUM SUCC 40 MG IJ SOLR: 40.0000 mg | Freq: Every day | INTRAMUSCULAR | Status: DC

## 2021-03-10 MED ORDER — SODIUM CHLORIDE 0.9 % IV SOLN: INTRAVENOUS | Status: DC

## 2021-03-10 MED ORDER — SODIUM CHLORIDE 0.9 % IV BOLUS
1000.0000 mL | Freq: Once | INTRAVENOUS | Status: AC
  Administered 2021-03-10: 1000 mL via INTRAVENOUS

## 2021-03-10 MED ORDER — COLCHICINE 0.6 MG PO TABS
1.2000 mg | ORAL_TABLET | Freq: Once | ORAL | Status: DC
  Filled 2021-03-10: qty 2

## 2021-03-10 MED ORDER — COLCHICINE 0.6 MG PO TABS
0.6000 mg | ORAL_TABLET | Freq: Two times a day (BID) | ORAL | Status: DC
  Administered 2021-03-10 – 2021-03-12 (×5): 0.6 mg via ORAL
  Filled 2021-03-10 (×5): qty 1

## 2021-03-10 MED ORDER — LORAZEPAM 2 MG/ML IJ SOLN: 1.0000 mg | Freq: Once | INTRAMUSCULAR | Status: AC

## 2021-03-10 MED ORDER — NICOTINE 21 MG/24HR TD PT24
21.0000 mg | MEDICATED_PATCH | Freq: Every day | TRANSDERMAL | Status: DC
  Administered 2021-03-10: 21 mg via TRANSDERMAL
  Filled 2021-03-10 (×2): qty 1

## 2021-03-10 MED ORDER — FENTANYL CITRATE (PF) 100 MCG/2ML IJ SOLN
50.0000 ug | Freq: Once | INTRAMUSCULAR | Status: AC
Start: 2021-03-10 — End: 2021-03-10
  Administered 2021-03-10: 50 ug via INTRAVENOUS
  Filled 2021-03-10: qty 2

## 2021-03-10 NOTE — Plan of Care (Signed)

## 2021-03-10 NOTE — ED Triage Notes (Addendum)
Pt was seen last pm for left knee pain. Pt states pain continues and he is not able to rest. Pt denies known fever, denies calf pain tonight. Left knee is slightly swollen and very painful to touch.

## 2021-03-10 NOTE — ED Notes (Signed)
Bill RN aware of assigned bed 

## 2021-03-10 NOTE — ED Notes (Signed)
Ice pack provided to pt

## 2021-03-10 NOTE — Consult Note (Signed)
ORTHOPAEDIC CONSULTATION  REQUESTING PHYSICIAN: Ivor Costa, MD  Chief Complaint:   Left knee pain and swelling  History of Present Illness: Travis Huynh is a 55 y.o. male with a history of hypertension, stage II chronic kidney disease, and palpitations who presented to the ER with a 4-5 day history of left knee pain and swelling.  The patient recalls performing some activities requiring him to squat and kneel onto his flexed knee about 5 days ago.  The following day, he began to notice increased pain and swelling in the anterior and lateral aspects of the knee.  He presented to the emergency room 2 days ago.  X-rays of his knee as well as a Doppler ultrasound of his left lower extremity to look for a DVT at that time were unremarkable.  He was offered but declined an aspiration of the knee at that time.  He was sent home with prescriptions for p.o. antibiotics, ibuprofen, and a few Percocet.  The patient returned to the emergency room last evening complaining of continued pain and so was admitted for further evaluation and treatment.  His symptoms are aggravated by flexing his knee.  He denies any numbness or paresthesias to his foot, and denies any fevers or chills.  Past Medical History:  Diagnosis Date   CKD (chronic kidney disease), stage II    Hypertension    Palpitations    Past Surgical History:  Procedure Laterality Date   MOUTH SURGERY     Social History   Socioeconomic History   Marital status: Married    Spouse name: Not on file   Number of children: Not on file   Years of education: Not on file   Highest education level: Not on file  Occupational History   Not on file  Tobacco Use   Smoking status: Every Day    Packs/day: 0.25    Pack years: 0.00    Types: Cigarettes   Smokeless tobacco: Never   Tobacco comments:    pt smokes about 2 cigarettes a day   Vaping Use   Vaping Use: Never used  Substance  and Sexual Activity   Alcohol use: Yes    Alcohol/week: 1.0 standard drink    Types: 1 Cans of beer per week    Comment: occasionally   Drug use: Never   Sexual activity: Yes  Other Topics Concern   Not on file  Social History Narrative   Not on file   Social Determinants of Health   Financial Resource Strain: Not on file  Food Insecurity: Not on file  Transportation Needs: Not on file  Physical Activity: Not on file  Stress: Not on file  Social Connections: Not on file   Family History  Problem Relation Age of Onset   Prostate cancer Maternal Uncle    Colon cancer Neg Hx    Colon polyps Neg Hx    Esophageal cancer Neg Hx    Stomach cancer Neg Hx    No Known Allergies Prior to Admission medications   Medication Sig Start Date End Date Taking? Authorizing Provider  amLODipine (NORVASC) 5 MG tablet TAKE 1 TABLET BY MOUTH DAILY Patient taking differently: Take 5 mg by mouth daily. 01/30/21 01/30/22 Yes Pemberton, Greer Ee, MD  cephALEXin (KEFLEX) 500 MG capsule Take 1 capsule (500 mg total) by mouth 4 (four) times daily. 03/09/21  Yes Hinda Kehr, MD  hydrochlorothiazide (MICROZIDE) 12.5 MG capsule TAKE 1 CAPSULE BY MOUTH DAILY Patient taking differently: Take 12.5 mg by mouth  daily. 01/30/21 01/30/22 Yes Freada Bergeron, MD  metoprolol tartrate (LOPRESSOR) 25 MG tablet Take 1 tablet (25 mg total) by mouth 2 (two) times daily as needed (palpitations). 01/30/21  Yes Freada Bergeron, MD  sulfamethoxazole-trimethoprim (BACTRIM DS) 800-160 MG tablet Take 1 tablet by mouth 2 (two) times daily for 10 days. 03/09/21 03/19/21 Yes Hinda Kehr, MD  HYDROcodone-acetaminophen (NORCO/VICODIN) 5-325 MG tablet Take 2 tablets by mouth every 6 (six) hours as needed for moderate pain or severe pain. 03/09/21   Hinda Kehr, MD  metoprolol succinate (TOPROL XL) 25 MG 24 hr tablet Take 1 tablet (25 mg total) by mouth at bedtime. Patient not taking: Reported on 03/10/2021 01/30/21   Freada Bergeron, MD   DG Knee 2 Views Left  Result Date: 03/09/2021 CLINICAL DATA:  Pain and swelling EXAM: LEFT KNEE - 1-2 VIEW COMPARISON:  None. FINDINGS: No evidence of fracture, dislocation. No cortical erosion or destruction. Possible trace joint effusion. No evidence of arthropathy or other focal bone abnormality. Anterior knee subcutaneus soft tissue edema. No CT findings of subcutaneus soft tissue emphysema or organized fluid collection. IMPRESSION: 1. No acute displaced fracture or dislocation. 2. Anterior knee subcutaneus soft tissue edema. 3. Possible trace joint effusion. Electronically Signed   By: Iven Finn M.D.   On: 03/09/2021 03:35   US Venous Img Lower Unilateral Left  Result Date: 03/08/2021 CLINICAL DATA:  Calf and knee pain for 3 days. EXAM: LEFT LOWER EXTREMITY VENOUS DOPPLER ULTRASOUND TECHNIQUE: Gray-scale sonography with compression, as well as color and duplex ultrasound, were performed to evaluate the deep venous system(s) from the level of the common femoral vein through the popliteal and proximal calf veins. COMPARISON:  None. FINDINGS: VENOUS Normal compressibility of the common femoral, superficial femoral, and popliteal veins, as well as the visualized calf veins. Visualized portions of profunda femoral vein and great saphenous vein unremarkable. No filling defects to suggest DVT on grayscale or color Doppler imaging. Doppler waveforms show normal direction of venous flow, normal respiratory plasticity and response to augmentation. Limited views of the contralateral common femoral vein are unremarkable. OTHER None. Limitations: none IMPRESSION: No evidence of left lower extremity DVT. Electronically Signed   By: Keith Rake M.D.   On: 03/08/2021 22:36    Positive ROS: All other systems have been reviewed and were otherwise negative with the exception of those mentioned in the HPI and as above.  Physical Exam: General:  Alert, no acute distress Psychiatric:  Patient  is competent for consent with normal mood and affect   Cardiovascular:  No pedal edema Respiratory:  No wheezing, non-labored breathing GI:  Abdomen is soft and non-tender Skin:  No lesions in the area of chief complaint Neurologic:  Sensation intact distally Lymphatic:  No axillary or cervical lymphadenopathy  Orthopedic Exam:  Orthopedic examination is limited to the left knee and lower extremity.  There is perhaps mild swelling over the anterior aspect of the knee, but no obvious fluctuant areas are identified.  He also does not appear to have any effusion in the knee.  No erythema, ecchymosis, abrasions, or other skin abnormalities are identified.  He has some mild-moderate tenderness to palpation over the prepatellar region, as well as mild tenderness medially, laterally, and over the patellar tendon region.  He is able to perform a straight leg raise, although with some discomfort.  He also is able to actively flex his knee beyond 50 degrees, again with reproduction of anterior knee pain.  He has  no specific medial or lateral joint line tenderness and no laxity to varus valgus stressing.  He also has a negative Lachman's test.  He is neurovascularly intact to the right lower extremity and foot.  X-rays:  AP and lateral x-rays of the left knee are available for review and have been reviewed by myself.  These films demonstrate no evidence of fractures, lytic lesions, or significant degenerative changes.  Assessment: Probable sterile prepatellar bursitis left knee.  Plan: The treatment options have been discussed with the patient.  Based on his examination findings and laboratory results, I do not feel he has an infectious process going on.  However, as a precaution, we will proceed with obtaining an MRI scan to evaluate for any other occult pathology, given his rather unusual presentation and apparent subjective severity of symptoms.    Meanwhile, the patient may be started on an oral  prednisone taper to see if this helps alleviate his symptoms.  In addition, ice may be applied to the anterior aspect of the knee to help reduce any inflammation.  He may be mobilized as tolerated with physical therapy.  Thank you for asked me to participate in the care of this gentleman.  I will be happy to follow him with you.   Pascal Lux, MD  Beeper #:  [phone]  03/10/2021 11:48 AM

## 2021-03-10 NOTE — ED Provider Notes (Signed)
[facility] Emergency Department Provider Note  ____________________________________________  Time seen: Approximately 6:26 AM  I have reviewed the triage vital signs and the nursing notes.   HISTORY  Chief Complaint Knee Pain   HPI Travis Huynh is a 55 y.o. male with history of hypertension who presents for evaluation of left knee pain.  Patient was seen here yesterday for the same.  Has had 4 days of redness, swelling, and pain of his knee.  Has been having difficulty walking due to the severity of the pain.  Denies any injuries, fever, chills, history of IV drug use.  He denies any prior history of gout but does report at least 2 episodes in the past of having similar symptoms of this knee which resolved without intervention.   Past Medical History:  Diagnosis Date   Hypertension    Palpitations     There are no problems to display for this patient.   Past Surgical History:  Procedure Laterality Date   MOUTH SURGERY      Prior to Admission medications   Medication Sig Start Date End Date Taking? Authorizing Provider  amLODipine (NORVASC) 5 MG tablet TAKE 1 TABLET BY MOUTH DAILY 01/30/21 01/30/22  Freada Bergeron, MD  cephALEXin (KEFLEX) 500 MG capsule Take 1 capsule (500 mg total) by mouth 4 (four) times daily. 03/09/21   Hinda Kehr, MD  hydrochlorothiazide (MICROZIDE) 12.5 MG capsule TAKE 1 CAPSULE BY MOUTH DAILY 01/30/21 01/30/22  Freada Bergeron, MD  HYDROcodone-acetaminophen (NORCO/VICODIN) 5-325 MG tablet Take 2 tablets by mouth every 6 (six) hours as needed for moderate pain or severe pain. 03/09/21   Hinda Kehr, MD  metoprolol succinate (TOPROL XL) 25 MG 24 hr tablet Take 1 tablet (25 mg total) by mouth at bedtime. 01/30/21   Freada Bergeron, MD  metoprolol tartrate (LOPRESSOR) 25 MG tablet Take 1 tablet (25 mg total) by mouth 2 (two) times daily as needed (palpitations). 01/30/21   Freada Bergeron, MD   sulfamethoxazole-trimethoprim (BACTRIM DS) 800-160 MG tablet Take 1 tablet by mouth 2 (two) times daily for 10 days. 03/09/21 03/19/21  Hinda Kehr, MD    Allergies Patient has no known allergies.  Family History  Problem Relation Age of Onset   Prostate cancer Maternal Uncle    Colon cancer Neg Hx    Colon polyps Neg Hx    Esophageal cancer Neg Hx    Stomach cancer Neg Hx     Social History Social History   Tobacco Use   Smoking status: Every Day    Packs/day: 0.25    Pack years: 0.00    Types: Cigarettes   Smokeless tobacco: Never   Tobacco comments:    pt smokes about 2 cigarettes a day   Vaping Use   Vaping Use: Never used  Substance Use Topics   Alcohol use: Yes    Alcohol/week: 1.0 standard drink    Types: 1 Cans of beer per week    Comment: occasionally   Drug use: Never    Review of Systems  Constitutional: Negative for fever. Eyes: Negative for visual changes. ENT: Negative for sore throat. Neck: No neck pain  Cardiovascular: Negative for chest pain. Respiratory: Negative for shortness of breath. Gastrointestinal: Negative for abdominal pain, vomiting or diarrhea. Genitourinary: Negative for dysuria. Musculoskeletal: Negative for back pain. + L knee redness and swelling Skin: Negative for rash. Neurological: Negative for headaches, weakness or numbness. Psych: No SI or HI  ____________________________________________   PHYSICAL  EXAM:  VITAL SIGNS: ED Triage Vitals [03/10/21 0002]  Enc Vitals Group     BP (!) 144/91     Pulse Rate 60     Resp 16     Temp 98.6 F (37 C)     Temp Source Oral     SpO2 100 %     Weight 149 lb 14.6 oz (68 kg)     Height 5\' 7"  (1.702 m)     Head Circumference      Peak Flow      Pain Score 10     Pain Loc      Pain Edu?      Excl. in [place]?     Constitutional: Alert and oriented. Well appearing and in no apparent distress. HEENT:      Head: Normocephalic and atraumatic.         Eyes: Conjunctivae are  normal. Sclera is non-icteric.       Mouth/Throat: Mucous membranes are moist.       Neck: Supple with no signs of meningismus. Cardiovascular: Regular rate and rhythm. No murmurs, gallops, or rubs.  Respiratory: Normal respiratory effort. Lungs are clear to auscultation bilaterally.  Gastrointestinal: Soft, non tender. Musculoskeletal:  L knee is swollen with erythema mostly over the patella and superior aspect.  Warm to the touch with significantly reduced range of motion although patient is able to somewhat bear minimal weight on it. Neurologic: Normal speech and language. Face is symmetric. Moving all extremities. No gross focal neurologic deficits are appreciated. Skin: Skin is warm, dry and intact. No rash noted. Psychiatric: Mood and affect are normal. Speech and behavior are normal.  ____________________________________________   LABS (all labs ordered are listed, but only abnormal results are displayed)  Labs Reviewed  CBC WITH DIFFERENTIAL/PLATELET - Abnormal; Notable for the following components:      Result Value   Hemoglobin 12.2 (*)    HCT 36.1 (*)    All other components within normal limits  COMPREHENSIVE METABOLIC PANEL - Abnormal; Notable for the following components:   Glucose, Bld 112 (*)    BUN 24 (*)    Creatinine, Ser 1.92 (*)    Total Protein 8.3 (*)    GFR, Estimated 41 (*)    All other components within normal limits  SEDIMENTATION RATE - Abnormal; Notable for the following components:   Sed Rate 60 (*)    All other components within normal limits  BODY FLUID CULTURE W GRAM STAIN  RESP PANEL BY RT-PCR (FLU A&B, COVID) ARPGX2  SYNOVIAL CELL COUNT + DIFF, W/ CRYSTALS   ____________________________________________  EKG  none  ____________________________________________  RADIOLOGY  none   ____________________________________________   PROCEDURES  Procedure(s) performed: yes .Joint Aspiration/Arthrocentesis  Date/Time: 03/10/2021 6:29  AM Performed by: Rudene Re, MD Authorized by: Rudene Re, MD   Consent:    Consent obtained:  Written   Consent given by:  Patient   Risks discussed:  Bleeding, infection, pain and incomplete drainage (inability to get any fluid) Universal protocol:    Procedure explained and questions answered to patient or proxy's satisfaction: yes     Test results available: yes     Imaging studies available: yes     Site/side marked: yes     Patient identity confirmed:  Verbally with patient Location:    Location:  Knee   Knee:  L knee Anesthesia:    Anesthesia method:  Topical application   Topical anesthetic:  EMLA cream Procedure details:  Preparation: Patient was prepped and draped in usual sterile fashion     Needle gauge:  59 G   Ultrasound guidance: yes     Approach:  Lateral   Aspirate amount:  0   Steroid injected: no     Specimen collected: no   Post-procedure details:    Dressing:  Adhesive bandage   Procedure completion:  Tolerated well, no immediate complications   Critical Care performed:  None ____________________________________________   INITIAL IMPRESSION / ASSESSMENT AND PLAN / ED COURSE  55 y.o. male with history of hypertension who presents for evaluation of left knee pain, swelling, erythema, warmth.  Patient with severe pain and difficulty walking, has very limited range of motion of the knee.  This is the third episode on the same knee which makes me more concerned for a inflammatory process instead of the septic joint.  However since patient has never been seen for this before with the discussed risks and benefits of arthrocentesis and patient opted to undergo the procedure.  Unfortunately no fluid was able to be collected although the needle was able to be advanced into the joint.  We will start with IV steroids, Toradol, and colchicine.  We will discussed with the hospitalist for admission for pain control and monitoring of symptoms.  Will hold  off antibiotics for now.    Ddx inflammatory arthritis versus septic arthritis versus bursitis     _____________________________________________ Please note:  Patient was evaluated in Emergency Department today for the symptoms described in the history of present illness. Patient was evaluated in the context of the global COVID-19 pandemic, which necessitated consideration that the patient might be at risk for infection with the SARS-CoV-2 virus that causes COVID-19. Institutional protocols and algorithms that pertain to the evaluation of patients at risk for COVID-19 are in a state of rapid change based on information released by regulatory bodies including the CDC and federal and state organizations. These policies and algorithms were followed during the patient's care in the ED.  Some ED evaluations and interventions may be delayed as a result of limited staffing during the pandemic.   [place] Controlled Substance Database was reviewed by me. ____________________________________________   FINAL CLINICAL IMPRESSION(S) / ED DIAGNOSES   Final diagnoses:  Acute pain of left knee      NEW MEDICATIONS STARTED DURING THIS VISIT:  ED Discharge Orders     None        Note:  This document was prepared using Dragon voice recognition software and may include unintentional dictation errors.    Alfred Levins, [place], MD 03/10/21 [phone]

## 2021-03-10 NOTE — Progress Notes (Signed)
Pt admitted to room 156.   03/10/21 0832  Vitals  Temp 98.5 F (36.9 C)  Temp Source Oral  BP 128/76  MAP (mmHg) 93  BP Location Left Arm  BP Method Automatic  Patient Position (if appropriate) Lying  Pulse Rate (!) 49  Resp 16  MEWS COLOR  MEWS Score Color Green  Oxygen Therapy  SpO2 100 %  O2 Device Room Air

## 2021-03-10 NOTE — H&P (Addendum)
Left  History and Physical    Travis Huynh QIO:[id number] DOB: 1966/05/29 DOA: 03/10/2021  Referring MD/NP/PA:   PCP: Wendie Agreste, MD   Patient coming from:  The patient is coming from home.  At baseline, pt is independent for most of ADL.        Chief Complaint: left knee pain and swelling  HPI: Travis Huynh is a 55 y.o. male with medical history significant of HTN, palpitation, tobacco abuse, CKD-II, who presents with knee pain and swelling.  Pt states that he has been having left knee pain and mild swelling for about 4 days, which has been progressively worsening.  The pain is constant, sharp, moderate to severe, nonradiating.  No injury.  Patient states that he had left knee pain in the past without clear diagnosis.  Patient was seen in the ED yesterday, and had negative lower extremity Doppler for DVT.  Patient was discharged home on Bactrim and Keflex, no improvement.  He has difficulty walking due to severe pain.  Denies fever or chills. Patient does not have chest pain, cough, shortness of breath.  No nausea vomiting, diarrhea or abdominal pain.  No symptoms of UTI.  ED Course: pt was found to have WBC 6.7, negative COVID PCR, ESR 60,  uric acid 10.4, worsening renal function, temperature normal, blood pressure 149/92, heart rate 55, RR 21, oxygen saturation 96% on room air.  X-ray of left knee showed soft tissue edema and trace amount of effusion without bony fracture.  Patient is placed on MedSurg bed for patient, Dr. Roland Rack of Ortho is consulted.   Review of Systems:   General: no fevers, chills, no body weight gain, has fatigue HEENT: no blurry vision, hearing changes or sore throat Respiratory: no dyspnea, coughing, wheezing CV: no chest pain, no palpitations GI: no nausea, vomiting, abdominal pain, diarrhea, constipation GU: no dysuria, burning on urination, increased urinary frequency, hematuria  Ext: has left knee pain and swelling Neuro: no unilateral weakness,  numbness, or tingling, no vision change or hearing loss Skin: no rash, no skin tear. MSK: No muscle spasm, no deformity, no limitation of range of movement in spin Heme: No easy bruising.  Travel history: No recent long distant travel.  Allergy: No Known Allergies  Past Medical History:  Diagnosis Date   CKD (chronic kidney disease), stage II    Hypertension    Palpitations     Past Surgical History:  Procedure Laterality Date   MOUTH SURGERY      Social History:  reports that he has been smoking cigarettes. He has been smoking an average of 0.25 packs per day. He has never used smokeless tobacco. He reports current alcohol use of about 1.0 standard drink of alcohol per week. He reports that he does not use drugs.  Family History:  Family History  Problem Relation Age of Onset   Prostate cancer Maternal Uncle    Colon cancer Neg Hx    Colon polyps Neg Hx    Esophageal cancer Neg Hx    Stomach cancer Neg Hx      Prior to Admission medications   Medication Sig Start Date End Date Taking? Authorizing Provider  amLODipine (NORVASC) 5 MG tablet TAKE 1 TABLET BY MOUTH DAILY Patient taking differently: Take 5 mg by mouth daily. 01/30/21 01/30/22 Yes Pemberton, Greer Ee, MD  cephALEXin (KEFLEX) 500 MG capsule Take 1 capsule (500 mg total) by mouth 4 (four) times daily. 03/09/21  Yes Hinda Kehr, MD  hydrochlorothiazide (MICROZIDE) 12.5  MG capsule TAKE 1 CAPSULE BY MOUTH DAILY Patient taking differently: Take 12.5 mg by mouth daily. 01/30/21 01/30/22 Yes Freada Bergeron, MD  metoprolol tartrate (LOPRESSOR) 25 MG tablet Take 1 tablet (25 mg total) by mouth 2 (two) times daily as needed (palpitations). 01/30/21  Yes Freada Bergeron, MD  sulfamethoxazole-trimethoprim (BACTRIM DS) 800-160 MG tablet Take 1 tablet by mouth 2 (two) times daily for 10 days. 03/09/21 03/19/21 Yes Hinda Kehr, MD  HYDROcodone-acetaminophen (NORCO/VICODIN) 5-325 MG tablet Take 2 tablets by mouth every 6  (six) hours as needed for moderate pain or severe pain. 03/09/21   Hinda Kehr, MD  metoprolol succinate (TOPROL XL) 25 MG 24 hr tablet Take 1 tablet (25 mg total) by mouth at bedtime. Patient not taking: Reported on 03/10/2021 01/30/21   Freada Bergeron, MD    Physical Exam: Vitals:   03/10/21 0735 03/10/21 0800 03/10/21 0832 03/10/21 1144  BP:  123/77 128/76 107/63  Pulse:  66 (!) 49 (!) 52  Resp:  $Remo'14 16 15  'hhCUs$ Temp: 98.2 F (36.8 C)  98.5 F (36.9 C) 98.6 F (37 C)  TempSrc: Oral  Oral   SpO2:  96% 100% 97%  Weight:      Height:       General: Not in acute distress HEENT:       Eyes: PERRL, EOMI, no scleral icterus.       ENT: No discharge from the ears and nose, no pharynx injection, no tonsillar enlargement.        Neck: No JVD, no bruit, no mass felt. Heme: No neck lymph node enlargement. Cardiac: S1/S2, RRR, No murmurs, No gallops or rubs. Respiratory: No rales, wheezing, rhonchi or rubs. GI: Soft, nondistended, nontender, no rebound pain, no organomegaly, BS present. GU: No hematuria Ext: 1+DP/PT pulse bilaterally.  has left knee pain, mild swelling and mild erythema Musculoskeletal: No joint deformities, No joint redness or warmth, no limitation of ROM in spin. Skin: No rashes.  Neuro: Alert, oriented X3, cranial nerves II-XII grossly intact, moves all extremities normally.  Psych: Patient is not psychotic, no suicidal or hemocidal ideation.  Labs on Admission: I have personally reviewed following labs and imaging studies  CBC: Recent Labs  Lab 03/10/21 0025  WBC 6.7  NEUTROABS 4.0  HGB 12.2*  HCT 36.1*  MCV 84.0  PLT 275   Basic Metabolic Panel: Recent Labs  Lab 03/10/21 0025  NA 137  K 3.7  CL 105  CO2 22  GLUCOSE 112*  BUN 24*  CREATININE 1.92*  CALCIUM 9.1   GFR: Estimated Creatinine Clearance: 40.6 mL/min (A) (by C-G formula based on SCr of 1.92 mg/dL (H)). Liver Function Tests: Recent Labs  Lab 03/10/21 0025  AST 22  ALT 20  ALKPHOS  56  BILITOT 0.7  PROT 8.3*  ALBUMIN 4.2   No results for input(s): LIPASE, AMYLASE in the last 168 hours. No results for input(s): AMMONIA in the last 168 hours. Coagulation Profile: Recent Labs  Lab 03/10/21 0939  INR 1.1   Cardiac Enzymes: No results for input(s): CKTOTAL, CKMB, CKMBINDEX, TROPONINI in the last 168 hours. BNP (last 3 results) No results for input(s): PROBNP in the last 8760 hours. HbA1C: No results for input(s): HGBA1C in the last 72 hours. CBG: No results for input(s): GLUCAP in the last 168 hours. Lipid Profile: No results for input(s): CHOL, HDL, LDLCALC, TRIG, CHOLHDL, LDLDIRECT in the last 72 hours. Thyroid Function Tests: No results for input(s): TSH, T4TOTAL, FREET4, T3FREE,  THYROIDAB in the last 72 hours. Anemia Panel: No results for input(s): VITAMINB12, FOLATE, FERRITIN, TIBC, IRON, RETICCTPCT in the last 72 hours. Urine analysis: No results found for: COLORURINE, APPEARANCEUR, LABSPEC, PHURINE, GLUCOSEU, HGBUR, BILIRUBINUR, KETONESUR, PROTEINUR, UROBILINOGEN, NITRITE, LEUKOCYTESUR Sepsis Labs: $RemoveBefo'@[id number]$ (procalcitonin:4,lacticidven:4) ) Recent Results (from the past 240 hour(s))  Resp Panel by RT-PCR (Flu A&B, Covid) Nasopharyngeal Swab     Status: None   Collection Time: 03/10/21  6:47 AM   Specimen: Nasopharyngeal Swab; Nasopharyngeal(NP) swabs in vial transport medium  Result Value Ref Range Status   SARS Coronavirus 2 by RT PCR NEGATIVE NEGATIVE Final    Comment: (NOTE) SARS-CoV-2 target nucleic acids are NOT DETECTED.  The SARS-CoV-2 RNA is generally detectable in upper respiratory specimens during the acute phase of infection. The lowest concentration of SARS-CoV-2 viral copies this assay can detect is 138 copies/mL. A negative result does not preclude SARS-Cov-2 infection and should not be used as the sole basis for treatment or other patient management decisions. A negative result may occur with  improper specimen  collection/handling, submission of specimen other than nasopharyngeal swab, presence of viral mutation(s) within the areas targeted by this assay, and inadequate number of viral copies(<138 copies/mL). A negative result must be combined with clinical observations, patient history, and epidemiological information. The expected result is Negative.  Fact Sheet for Patients:  EntrepreneurPulse.com.au  Fact Sheet for Healthcare Providers:  IncredibleEmployment.be  This test is no t yet approved or cleared by the [place] FDA and  has been authorized for detection and/or diagnosis of SARS-CoV-2 by FDA under an Emergency Use Authorization (EUA). This EUA will remain  in effect (meaning this test can be used) for the duration of the COVID-19 declaration under Section 564(b)(1) of the Act, 21 U.S.C.section 360bbb-3(b)(1), unless the authorization is terminated  or revoked sooner.       Influenza A by PCR NEGATIVE NEGATIVE Final   Influenza B by PCR NEGATIVE NEGATIVE Final    Comment: (NOTE) The Xpert Xpress SARS-CoV-2/FLU/RSV plus assay is intended as an aid in the diagnosis of influenza from Nasopharyngeal swab specimens and should not be used as a sole basis for treatment. Nasal washings and aspirates are unacceptable for Xpert Xpress SARS-CoV-2/FLU/RSV testing.  Fact Sheet for Patients: EntrepreneurPulse.com.au  Fact Sheet for Healthcare Providers: IncredibleEmployment.be  This test is not yet approved or cleared by the [place] FDA and has been authorized for detection and/or diagnosis of SARS-CoV-2 by FDA under an Emergency Use Authorization (EUA). This EUA will remain in effect (meaning this test can be used) for the duration of the COVID-19 declaration under Section 564(b)(1) of the Act, 21 U.S.C. section 360bbb-3(b)(1), unless the authorization is terminated or revoked.  Performed at [facility], [address] [place], [place] [address]      Radiological Exams on Admission: DG Knee 2 Views Left  Result Date: 03/09/2021 CLINICAL DATA:  Pain and swelling EXAM: LEFT KNEE - 1-2 VIEW COMPARISON:  None. FINDINGS: No evidence of fracture, dislocation. No cortical erosion or destruction. Possible trace joint effusion. No evidence of arthropathy or other focal bone abnormality. Anterior knee subcutaneus soft tissue edema. No CT findings of subcutaneus soft tissue emphysema or organized fluid collection. IMPRESSION: 1. No acute displaced fracture or dislocation. 2. Anterior knee subcutaneus soft tissue edema. 3. Possible trace joint effusion. Electronically Signed   By: Iven Finn M.D.   On: 03/09/2021 03:35   US Venous Img Lower Unilateral Left  Result Date: 03/08/2021 CLINICAL DATA:  Calf and knee pain for 3 days. EXAM: LEFT LOWER EXTREMITY VENOUS DOPPLER ULTRASOUND TECHNIQUE: Gray-scale sonography with compression, as well as color and duplex ultrasound, were performed to evaluate the deep venous system(s) from the level of the common femoral vein through the popliteal and proximal calf veins. COMPARISON:  None. FINDINGS: VENOUS Normal compressibility of the common femoral, superficial femoral, and popliteal veins, as well as the visualized calf veins. Visualized portions of profunda femoral vein and great saphenous vein unremarkable. No filling defects to suggest DVT on grayscale or color Doppler imaging. Doppler waveforms show normal direction of venous flow, normal respiratory plasticity and response to augmentation. Limited views of the contralateral common femoral vein are unremarkable. OTHER None. Limitations: none IMPRESSION: No evidence of left lower extremity DVT. Electronically Signed   By: Keith Rake M.D.   On: 03/08/2021 22:36     EKG:  Not done in ED, will get one.   Assessment/Plan Principal Problem:   Left knee pain Active Problems:   Hypertension    Tobacco abuse   Acute renal failure superimposed on stage 2 chronic kidney disease (HCC)   Left knee pain: Etiology is not clear.  This is recurrent issue.  Uric acid is elevated at 10.4, indicating possible gout.  Patient does not have fever, no leukocytosis.  Did not response to antibiotic treatment, low suspicions for septic joint.  Will hold off antibiotics.  ED physician tried to collect the synovial fluid without success.  Consulted Dr. Roland Rack of [place] bed follow-up physician -Started colchicine 0.6 mg twice daily empirically -Patient received 10 mg of Decadron --> will give solumedrol 40 mg daily  Hypertension -Amlodipine and metoprolol -Hold HCTZ due to worsening renal function -IV hydralazine as needed  Tobacco abuse -Nicotine patch  Acute renal failure superimposed on stage 2 chronic kidney disease ([place]): Recent baseline creatinine 1.07.  His creatinine is 1.92, BUN 24.  Likely due to multifactorial etiology, including dehydration, Bactrim use and continuation of HCTZ. -Hold HCTZ -Will not continue Bactrim -IV fluid: 1 L normal saline followed by 75 cc/h     DVT ppx: SCD Code Status: Full code Family Communication: not done, no family member is at bed side.     Disposition Plan:  Anticipate discharge back to previous environment Consults called:  Dr. Roland Rack Admission status and Level of care: Med-Surg:    for obs  Status is: Observation  The patient remains OBS appropriate and will d/c before 2 midnights.  Dispo: The patient is from: Home              Anticipated d/c is to: Home              Patient currently is not medically stable to d/c.   Difficult to place patient No           Date of Service 03/10/2021    Ivor Costa [facility]   If 7PM-7AM, please contact night-coverage [email or web address] 03/10/2021, 2:57 PM

## 2021-03-10 NOTE — ED Notes (Signed)
ED Provider at bedside. 

## 2021-03-11 DIAGNOSIS — R2242 Localized swelling, mass and lump, left lower limb: Secondary | ICD-10-CM | POA: Diagnosis not present

## 2021-03-11 DIAGNOSIS — F17210 Nicotine dependence, cigarettes, uncomplicated: Secondary | ICD-10-CM | POA: Diagnosis not present

## 2021-03-11 DIAGNOSIS — I129 Hypertensive chronic kidney disease with stage 1 through stage 4 chronic kidney disease, or unspecified chronic kidney disease: Secondary | ICD-10-CM | POA: Diagnosis not present

## 2021-03-11 DIAGNOSIS — N182 Chronic kidney disease, stage 2 (mild): Secondary | ICD-10-CM | POA: Diagnosis not present

## 2021-03-11 DIAGNOSIS — M109 Gout, unspecified: Secondary | ICD-10-CM | POA: Diagnosis not present

## 2021-03-11 DIAGNOSIS — M7042 Prepatellar bursitis, left knee: Secondary | ICD-10-CM | POA: Diagnosis not present

## 2021-03-11 DIAGNOSIS — M25562 Pain in left knee: Secondary | ICD-10-CM | POA: Diagnosis not present

## 2021-03-11 DIAGNOSIS — Z20822 Contact with and (suspected) exposure to covid-19: Secondary | ICD-10-CM | POA: Diagnosis not present

## 2021-03-11 DIAGNOSIS — M10362 Gout due to renal impairment, left knee: Secondary | ICD-10-CM | POA: Diagnosis not present

## 2021-03-11 DIAGNOSIS — Z79899 Other long term (current) drug therapy: Secondary | ICD-10-CM | POA: Diagnosis not present

## 2021-03-11 DIAGNOSIS — N179 Acute kidney failure, unspecified: Secondary | ICD-10-CM | POA: Diagnosis not present

## 2021-03-11 LAB — CBC
HCT: 34.4 % — ABNORMAL LOW (ref 39.0–52.0)
Hemoglobin: 11.5 g/dL — ABNORMAL LOW (ref 13.0–17.0)
MCH: 28.5 pg (ref 26.0–34.0)
MCHC: 33.4 g/dL (ref 30.0–36.0)
MCV: 85.1 fL (ref 80.0–100.0)
Platelets: 211 10*3/uL (ref 150–400)
RBC: 4.04 MIL/uL — ABNORMAL LOW (ref 4.22–5.81)
RDW: 14.6 % (ref 11.5–15.5)
WBC: 8.5 10*3/uL (ref 4.0–10.5)
nRBC: 0 % (ref 0.0–0.2)

## 2021-03-11 LAB — BASIC METABOLIC PANEL
Anion gap: 5 (ref 5–15)
BUN: 17 mg/dL (ref 6–20)
CO2: 25 mmol/L (ref 22–32)
Calcium: 8.7 mg/dL — ABNORMAL LOW (ref 8.9–10.3)
Chloride: 107 mmol/L (ref 98–111)
Creatinine, Ser: 0.95 mg/dL (ref 0.61–1.24)
GFR, Estimated: >60 mL/min (ref >60)
Glucose, Bld: 118 mg/dL — ABNORMAL HIGH (ref 70–99)
Potassium: 4 mmol/L (ref 3.5–5.1)
Sodium: 137 mmol/L (ref 135–145)

## 2021-03-11 NOTE — TOC Progression Note (Signed)
Transition of Care [facility]) - Progression Note    Patient Details  Name: Travis Huynh MRN: [id number] Date of Birth: Jul 29, 1966  Transition of Care [facility]) CM/SW [place], RN Phone Number: 03/11/2021, 2:53 PM  Clinical Narrative:     Met with the patient to discuss DC plan and needs He plans to be discharged home tomorrow whee he is independent He stated that he does not need DME of any kind He understands that he is Observation status and does not meet criteria for inpatient  He stated he will let me know if his needs changes but at this time he has no needs      Expected Discharge Plan and Services                                                 Social Determinants of Health (SDOH) Interventions    Readmission Risk Interventions No flowsheet data found.

## 2021-03-11 NOTE — Progress Notes (Addendum)
Patient ID: Travis Huynh, male   DOB: [birth year], 55 y.o.   MRN: [id number]  Subjective: The patient notes substantial improvement in his left knee symptoms today as compared to yesterday.  He is able to bend his knee better and is able to put weight on his leg.  He denies any new complaints.   Objective: Vital signs in last 24 hours: Temp:  [97.9 F (36.6 C)-98.7 F (37.1 C)] 98.2 F (36.8 C) (07/05 0810) Pulse Rate:  [52-88] 67 (07/05 0810) Resp:  [15-20] 16 (07/05 0810) BP: (104-143)/(63-88) 143/88 (07/05 0810) SpO2:  [97 %-100 %] 100 % (07/05 0810)  Intake/Output from previous day: 07/04 0701 - 07/05 0700 In: 1000 [I.V.:1000] Out: -  Intake/Output this shift: No intake/output data recorded.  Recent Labs    03/10/21 0025 03/11/21 0641  HGB 12.2* 11.5*   Recent Labs    03/10/21 0025 03/11/21 0641  WBC 6.7 8.5  RBC 4.30 4.04*  HCT 36.1* 34.4*  PLT 227 211   Recent Labs    03/10/21 0025 03/11/21 0641  NA 137 137  K 3.7 4.0  CL 105 107  CO2 22 25  BUN 24* 17  CREATININE 1.92* 0.95  GLUCOSE 112* 118*  CALCIUM 9.1 8.7*   Recent Labs    03/10/21 0939  INR 1.1    Physical Exam: Orthopedic examination again is limited to the left knee and lower extremity.  There is mild residual swelling over the anterior aspect of the knee, as well as at most a trace effusion.  He has mild tenderness to palpation over the prepatellar region, but there is no significant medial or lateral joint line tenderness.  Actively, he is able to extend his knee to 0 degrees and flex to 90 degrees with mild discomfort in maximal flexion.  He is neurovascularly intact to the left lower extremity and foot.  X-rays: The MRI scan of his left knee is notable for a small "abnormal" knee effusion, as well as evidence of prepatellar bursitis with increased soft tissue swelling in the prepatellar region.  No ligamentous or meniscal pathology is noted, nor are there any obvious bony abnormalities  identified.  Assessment: 1.  Sterile prepatellar bursitis left knee. 2.  Probable gout left knee, improving symptomatically.  Plan: Overall, the patient is pleased with his symptomatic and functional improvement at this time.  He should continue to be treated for gout with colchicine and possible oral steroids.  He may be mobilized as symptoms permit.   Marshall Cork Nakita [place] 03/11/2021, 8:38 AM

## 2021-03-11 NOTE — Progress Notes (Signed)
PROGRESS NOTE    Travis Huynh   RCB:[id number]  DOB: Aug 21, 1966  PCP: Wendie Agreste, MD    DOA: 03/10/2021 LOS: 0   Assessment & Plan   Principal Problem:   Left knee pain Active Problems:   Hypertension   Tobacco abuse   Acute renal failure superimposed on stage 2 chronic kidney disease (HCC)   Left Knee Pain due to Acute Gout Flare - no prior hx of gout.  Pt apparently declined joint aspiration, but elevated serum uric acid and history and exam consistent. --Ortho following --IV Solu-medrol then Prednisone  --Pain control PRN --Mobilize as tolerated, independent at baseline  AKI superimposed on CKD stage II - POA with Cr 1.92 with recent baseline Cr 1.07, improved to 0.95 with IV hydration.  AKI due to pre-renal due to mild dehydration, also was on Bactrim.   --Stop fluids --Monitor BMP --HCTZ held on admission --Avoid Bactrim   Hypertension - continue home amlodipine and metoprolol.  HCTZ held due to AKI.  PRN hydralazine.  Tobacco abuse - nicotine patch     Patient BMI: Body mass index is 23.48 kg/m.   DVT prophylaxis: SCDs Start: 03/10/21 0857   Diet:  Diet Orders (From admission, onward)     Start     Ordered   03/10/21 1503  Diet Heart Room service appropriate? Yes; Fluid consistency: Thin  Diet effective now       Question Answer Comment  Room service appropriate? Yes   Fluid consistency: Thin   Na restriction, if any: 2 gm Na      03/10/21 1502              Code Status: Full Code   Brief Narrative / Hospital Course to Date:   55 y.o. male with medical history significant of HTN, palpitation, tobacco abuse, CKD-II, who presents with knee pain and swelling due to gout flare.  No prior hx of gout per patient.   Subjective 03/11/21    Pt continues to have left knee pain, slightly improved.  Better range of motion. States was about to page RN for pain medication.  Does not think he'll be ready for d/c today.   Disposition Plan &  Communication   Status is: Observation  The patient remains OBS appropriate and will d/c before 2 midnights.  Dispo: The patient is from: Home              Anticipated d/c is to: Home              Patient currently not stable for d/c.   Difficult to place patient No     Consults, Procedures, Significant Events   Consultants:  Orthopedic surgery - Dr. Roland Rack  Procedures:    Antimicrobials:  Anti-infectives (From admission, onward)    Start     Dose/Rate Route Frequency Ordered Stop   03/10/21 1000  cephALEXin (KEFLEX) capsule 500 mg  Status:  Discontinued        500 mg Oral 4 times daily 03/10/21 0856 03/10/21 1450         Micro    Objective   Vitals:   03/10/21 2314 03/11/21 0522 03/11/21 0810 03/11/21 1252  BP: 125/73 118/71 (!) 143/88 140/79  Pulse: 71 (!) 53 67 88  Resp: 20 20 16 18   Temp: 98.2 F (36.8 C) 97.9 F (36.6 C) 98.2 F (36.8 C) 98.5 F (36.9 C)  TempSrc: Oral Oral Oral Oral  SpO2: 98% 99% 100% 100%  Weight:  Height:        Intake/Output Summary (Last 24 hours) at 03/11/2021 1533 Last data filed at 03/11/2021 1021 Gross per 24 hour  Intake 0 ml  Output --  Net 0 ml   Filed Weights   03/10/21 0002  Weight: 68 kg    Physical Exam:  General exam: awake, alert, no acute distress  Respiratory system: CTAB, no wheezes, rales or rhonchi, normal respiratory effort. Cardiovascular system: normal S1/S2, RRR Central nervous system: A&O x3. no gross focal neurologic deficits, normal speech Extremities: moves all, left knee swelling and tenderness of palpation over patella,  otherwise no edema, normal tone, left knee with differential warmth Psychiatry: normal mood, congruent affect, judgement and insight appear normal  Labs   Data Reviewed: I have personally reviewed following labs and imaging studies  CBC: Recent Labs  Lab 03/10/21 0025 03/11/21 0641  WBC 6.7 8.5  NEUTROABS 4.0  --   HGB 12.2* 11.5*  HCT 36.1* 34.4*  MCV 84.0  85.1  PLT 227 160   Basic Metabolic Panel: Recent Labs  Lab 03/10/21 0025 03/11/21 0641  NA 137 137  K 3.7 4.0  CL 105 107  CO2 22 25  GLUCOSE 112* 118*  BUN 24* 17  CREATININE 1.92* 0.95  CALCIUM 9.1 8.7*   GFR: Estimated Creatinine Clearance: 82.1 mL/min (by C-G formula based on SCr of 0.95 mg/dL). Liver Function Tests: Recent Labs  Lab 03/10/21 0025  AST 22  ALT 20  ALKPHOS 56  BILITOT 0.7  PROT 8.3*  ALBUMIN 4.2   No results for input(s): LIPASE, AMYLASE in the last 168 hours. No results for input(s): AMMONIA in the last 168 hours. Coagulation Profile: Recent Labs  Lab 03/10/21 0939  INR 1.1   Cardiac Enzymes: No results for input(s): CKTOTAL, CKMB, CKMBINDEX, TROPONINI in the last 168 hours. BNP (last 3 results) No results for input(s): PROBNP in the last 8760 hours. HbA1C: No results for input(s): HGBA1C in the last 72 hours. CBG: No results for input(s): GLUCAP in the last 168 hours. Lipid Profile: No results for input(s): CHOL, HDL, LDLCALC, TRIG, CHOLHDL, LDLDIRECT in the last 72 hours. Thyroid Function Tests: No results for input(s): TSH, T4TOTAL, FREET4, T3FREE, THYROIDAB in the last 72 hours. Anemia Panel: No results for input(s): VITAMINB12, FOLATE, FERRITIN, TIBC, IRON, RETICCTPCT in the last 72 hours. Sepsis Labs: No results for input(s): PROCALCITON, LATICACIDVEN in the last 168 hours.  Recent Results (from the past 240 hour(s))  Resp Panel by RT-PCR (Flu A&B, Covid) Nasopharyngeal Swab     Status: None   Collection Time: 03/10/21  6:47 AM   Specimen: Nasopharyngeal Swab; Nasopharyngeal(NP) swabs in vial transport medium  Result Value Ref Range Status   SARS Coronavirus 2 by RT PCR NEGATIVE NEGATIVE Final    Comment: (NOTE) SARS-CoV-2 target nucleic acids are NOT DETECTED.  The SARS-CoV-2 RNA is generally detectable in upper respiratory specimens during the acute phase of infection. The lowest concentration of SARS-CoV-2 viral copies  this assay can detect is 138 copies/mL. A negative result does not preclude SARS-Cov-2 infection and should not be used as the sole basis for treatment or other patient management decisions. A negative result may occur with  improper specimen collection/handling, submission of specimen other than nasopharyngeal swab, presence of viral mutation(s) within the areas targeted by this assay, and inadequate number of viral copies(<138 copies/mL). A negative result must be combined with clinical observations, patient history, and epidemiological information. The expected result is Negative.  Fact  Sheet for Patients:  EntrepreneurPulse.com.au  Fact Sheet for Healthcare Providers:  IncredibleEmployment.be  This test is no t yet approved or cleared by the [place] FDA and  has been authorized for detection and/or diagnosis of SARS-CoV-2 by FDA under an Emergency Use Authorization (EUA). This EUA will remain  in effect (meaning this test can be used) for the duration of the COVID-19 declaration under Section 564(b)(1) of the Act, 21 U.S.C.section 360bbb-3(b)(1), unless the authorization is terminated  or revoked sooner.       Influenza A by PCR NEGATIVE NEGATIVE Final   Influenza B by PCR NEGATIVE NEGATIVE Final    Comment: (NOTE) The Xpert Xpress SARS-CoV-2/FLU/RSV plus assay is intended as an aid in the diagnosis of influenza from Nasopharyngeal swab specimens and should not be used as a sole basis for treatment. Nasal washings and aspirates are unacceptable for Xpert Xpress SARS-CoV-2/FLU/RSV testing.  Fact Sheet for Patients: EntrepreneurPulse.com.au  Fact Sheet for Healthcare Providers: IncredibleEmployment.be  This test is not yet approved or cleared by the [place] FDA and has been authorized for detection and/or diagnosis of SARS-CoV-2 by FDA under an Emergency Use Authorization (EUA). This EUA  will remain in effect (meaning this test can be used) for the duration of the COVID-19 declaration under Section 564(b)(1) of the Act, 21 U.S.C. section 360bbb-3(b)(1), unless the authorization is terminated or revoked.  Performed at [facility], [address] [place], [place] [address]       Imaging Studies   MR KNEE LEFT WO CONTRAST  Result Date: 03/10/2021 CLINICAL DATA:  Knee pain and swelling EXAM: MRI OF THE LEFT KNEE WITHOUT CONTRAST TECHNIQUE: Multiplanar, multisequence MR imaging of the knee was performed. No intravenous contrast was administered. COMPARISON:  None. FINDINGS: MENISCI Medial meniscus:  Unremarkable Lateral meniscus:  Unremarkable LIGAMENTS Cruciates:  Unremarkable Collaterals:  Unremarkable CARTILAGE Patellofemoral:  Unremarkable Medial:  Unremarkable Lateral:  Unremarkable Joint:  Small but abnormal knee effusion. Popliteal Fossa:  Unremarkable Extensor Mechanism:  Unremarkable Bones: No significant extra-articular osseous abnormalities identified. Other: Substantial prepatellar edema with suspicion for mild prepatellar bursitis for example on image 19 series 19. IMPRESSION: 1. Mild prepatellar bursitis with surrounding prepatellar infiltrative edema. 2. Small but abnormal knee effusion. Electronically Signed   By: Van Clines M.D.   On: 03/10/2021 18:03     Medications   Scheduled Meds:  amLODipine  10 mg Oral Daily   colchicine  0.6 mg Oral BID   methylPREDNISolone (SOLU-MEDROL) injection  40 mg Intravenous Daily   nicotine  21 mg Transdermal Daily   Continuous Infusions:  sodium chloride 75 mL/hr at 03/11/21 0150       LOS: 0 days    Time spent: 30 minutes with > 50% spent at bedside and in coordination of care.    Ezekiel Slocumb, DO [facility]  03/11/2021, 3:33 PM      If 7PM-7AM, please contact night-coverage. How to contact the [facility] Attending or Consulting provider [place] or covering provider during after  hours [place], for this patient?    Check the care team in [facility] and look for a) attending/consulting [facility] provider listed and b) the [facility] team listed Log into [email or web address] and use [place]'s universal password to access. If you do not have the password, please contact the hospital operator. Locate the [facility] provider you are looking for under [facility] and page to a number that you can be directly reached. If you still have difficulty reaching the  provider, please page the [facility] (Director on Call) for the Hospitalists listed on amion for assistance.

## 2021-03-11 NOTE — Plan of Care (Signed)
  Problem: Education: Goal: Knowledge of General Education information will improve Description: Including pain rating scale, medication(s)/side effects and non-pharmacologic comfort measures Outcome: Progressing   Problem: Health Behavior/Discharge Planning: Goal: Ability to manage health-related needs will improve Outcome: Progressing   Problem: Clinical Measurements: Goal: Ability to maintain clinical measurements within normal limits will improve Outcome: Progressing Goal: Will remain free from infection Outcome: Progressing Goal: Diagnostic test results will improve Outcome: Progressing   Problem: Activity: Goal: Risk for activity intolerance will decrease Outcome: Progressing   Problem: Nutrition: Goal: Adequate nutrition will be maintained Outcome: Progressing   Problem: Coping: Goal: Level of anxiety will decrease Outcome: Progressing   Problem: Elimination: Goal: Will not experience complications related to bowel motility Outcome: Progressing Goal: Will not experience complications related to urinary retention Outcome: Progressing

## 2021-03-12 ENCOUNTER — Other Ambulatory Visit (HOSPITAL_COMMUNITY): Payer: Self-pay

## 2021-03-12 ENCOUNTER — Other Ambulatory Visit: Payer: Self-pay

## 2021-03-12 DIAGNOSIS — R2242 Localized swelling, mass and lump, left lower limb: Secondary | ICD-10-CM | POA: Diagnosis not present

## 2021-03-12 DIAGNOSIS — M25562 Pain in left knee: Secondary | ICD-10-CM

## 2021-03-12 DIAGNOSIS — M109 Gout, unspecified: Secondary | ICD-10-CM | POA: Diagnosis not present

## 2021-03-12 DIAGNOSIS — Z79899 Other long term (current) drug therapy: Secondary | ICD-10-CM | POA: Diagnosis not present

## 2021-03-12 DIAGNOSIS — Z20822 Contact with and (suspected) exposure to covid-19: Secondary | ICD-10-CM | POA: Diagnosis not present

## 2021-03-12 DIAGNOSIS — I129 Hypertensive chronic kidney disease with stage 1 through stage 4 chronic kidney disease, or unspecified chronic kidney disease: Secondary | ICD-10-CM | POA: Diagnosis not present

## 2021-03-12 DIAGNOSIS — N182 Chronic kidney disease, stage 2 (mild): Secondary | ICD-10-CM

## 2021-03-12 DIAGNOSIS — F17210 Nicotine dependence, cigarettes, uncomplicated: Secondary | ICD-10-CM | POA: Diagnosis not present

## 2021-03-12 DIAGNOSIS — I10 Essential (primary) hypertension: Secondary | ICD-10-CM | POA: Diagnosis not present

## 2021-03-12 DIAGNOSIS — N179 Acute kidney failure, unspecified: Secondary | ICD-10-CM | POA: Diagnosis not present

## 2021-03-12 DIAGNOSIS — M7042 Prepatellar bursitis, left knee: Secondary | ICD-10-CM | POA: Diagnosis not present

## 2021-03-12 DIAGNOSIS — Z720 Tobacco use: Secondary | ICD-10-CM | POA: Diagnosis not present

## 2021-03-12 DIAGNOSIS — M10362 Gout due to renal impairment, left knee: Secondary | ICD-10-CM | POA: Diagnosis not present

## 2021-03-12 MED ORDER — COLCHICINE 0.6 MG PO TABS
0.6000 mg | ORAL_TABLET | Freq: Two times a day (BID) | ORAL | 0 refills | Status: DC
  Filled 2021-03-12: qty 6, 3d supply, fill #0

## 2021-03-12 MED ORDER — PREDNISONE 20 MG PO TABS
40.0000 mg | ORAL_TABLET | Freq: Every day | ORAL | 0 refills | Status: AC
  Filled 2021-03-12: qty 12, 6d supply, fill #0

## 2021-03-12 MED ORDER — SULFAMETHOXAZOLE-TRIMETHOPRIM 800-160 MG PO TABS
1.0000 | ORAL_TABLET | Freq: Two times a day (BID) | ORAL | 0 refills | Status: DC
  Filled 2021-03-12: qty 20, 10d supply, fill #0

## 2021-03-12 MED ORDER — PREDNISONE 20 MG PO TABS
40.0000 mg | ORAL_TABLET | Freq: Every day | ORAL | 0 refills | Status: AC
  Filled 2021-03-12: qty 6, 3d supply, fill #0

## 2021-03-12 MED ORDER — OXYCODONE-ACETAMINOPHEN 5-325 MG PO TABS
1.0000 | ORAL_TABLET | ORAL | 0 refills | Status: DC | PRN
  Filled 2021-03-12: qty 7, 2d supply, fill #0

## 2021-03-12 MED ORDER — AMLODIPINE BESYLATE 10 MG PO TABS
10.0000 mg | ORAL_TABLET | Freq: Every day | ORAL | 0 refills | Status: DC
Start: 2021-03-13 — End: 2021-03-12
  Filled 2021-03-12: qty 30, 30d supply, fill #0

## 2021-03-12 MED ORDER — FAMOTIDINE 20 MG PO TABS
20.0000 mg | ORAL_TABLET | Freq: Every day | ORAL | 0 refills | Status: DC
  Filled 2021-03-12: qty 7, 7d supply, fill #0

## 2021-03-12 MED ORDER — AMLODIPINE BESYLATE 10 MG PO TABS
10.0000 mg | ORAL_TABLET | Freq: Every day | ORAL | 0 refills | Status: DC
  Filled 2021-03-12: qty 30, 30d supply, fill #0

## 2021-03-12 NOTE — Discharge Summary (Addendum)
Travis Huynh ION:[id number] DOB: 07-11-1966 DOA: 03/10/2021  PCP: Wendie Agreste, MD  Admit date: 03/10/2021 Discharge date: 03/12/2021  Admitted From: home Disposition:  home  Recommendations for Outpatient Follow-up:  Follow up with PCP in 1 week Please obtain BMP/CBC in one week       Discharge Condition:Stable CODE STATUS:full  Diet recommendation: Heart Healthy  Brief/Interim Summary: Per HPI: Travis Huynh is a 55 y.o. male with medical history significant of HTN, palpitation, tobacco abuse, CKD-II, who presents with knee pain and swelling.[place] Locks of left knee showed soft tissue edema and trace amount of effusion without bony fracture. Orthopedics Dr. Roland Rack was consulted.Patient also found with AKI on admission. Was started on IVF.   Left Knee Pain due to Acute Gout Flare - no prior hx of gout.  Pt apparently declined joint aspiration, but elevated serum uric acid and history and exam consistent. -- Orthopedics consulted-they thought patient has steroid prepatellar bursitis of left knee.  Probable gout of left knee which was improving symptomatically.  They recommended treating for gout with colchicine and oral steroids.  Mobilize as permitted.  This a.m. he was doing better.  Stable to be discharged. --was started on IV steroids with prednisone p.o. on discharge and colchicine. He has a ACE band at home which he was told to wear if tolerates  AKI superimposed on CKD stage II - POA with Cr 1.92 with recent baseline Cr 1.07, improved to 0.95 with IV hydration.  AKI due to pre-renal due to mild dehydration, also was on Bactrim.   His HCTZ was held on admission.  He will need to follow-up with his primary to see when he is able to resume HCTZ. --Avoid Bactrim   Hypertension - continue home amlodipine and metoprolol.  HCTZ held due to AKI.     Tobacco abuse -was placed on nicotine patch  Discharge Diagnoses:  Principal Problem:   Left knee pain Active Problems:    Hypertension   Tobacco abuse   Acute renal failure superimposed on stage 2 chronic kidney disease [facility])    Discharge Instructions  Discharge Instructions     Call MD for:  severe uncontrolled pain   Complete by: As directed    Diet - low sodium heart healthy   Complete by: As directed    Discharge instructions   Complete by: As directed    Follow up with the pcp next week. Discuss blood pressure meds too as we stopped HCTZ as your kidneys on admission was dry. Could be due to bactrim too.   Increase activity slowly   Complete by: As directed       Allergies as of 03/12/2021   No Known Allergies      Medication List     STOP taking these medications    cephALEXin 500 MG capsule Commonly known as: KEFLEX   hydrochlorothiazide 12.5 MG capsule Commonly known as: MICROZIDE   metoprolol succinate 25 MG 24 hr tablet Commonly known as: Toprol XL   sulfamethoxazole-trimethoprim 800-160 MG tablet Commonly known as: BACTRIM DS       TAKE these medications    amLODipine 10 MG tablet Commonly known as: NORVASC Take 1 tablet (10 mg total) by mouth daily. Start taking on: March 13, 2021 What changed:  medication strength how much to take   colchicine 0.6 MG tablet Take 1 tablet (0.6 mg total) by mouth 2 (two) times daily for 3 days.   famotidine 20 MG tablet Commonly known as: PEPCID Take 1 tablet (  20 mg total) by mouth daily for 7 days.   HYDROcodone-acetaminophen 5-325 MG tablet Commonly known as: NORCO/VICODIN Take 2 tablets by mouth every 6 (six) hours as needed for moderate pain or severe pain.   metoprolol tartrate 25 MG tablet Commonly known as: LOPRESSOR Take 1 tablet (25 mg total) by mouth 2 (two) times daily as needed (palpitations).   predniSONE 20 MG tablet Commonly known as: DELTASONE Take 2 tablets (40 mg total) by mouth daily for 3 days.        Follow-up Information     Wendie Agreste, MD Follow up in 1 week(s).   Specialties: Family  Medicine, Sports Medicine Contact information: [address] [place] [address] [phone]                No Known Allergies  Consultations: Orthopedics   Procedures/Studies: DG Knee 2 Views Left  Result Date: 03/09/2021 CLINICAL DATA:  Pain and swelling EXAM: LEFT KNEE - 1-2 VIEW COMPARISON:  None. FINDINGS: No evidence of fracture, dislocation. No cortical erosion or destruction. Possible trace joint effusion. No evidence of arthropathy or other focal bone abnormality. Anterior knee subcutaneus soft tissue edema. No CT findings of subcutaneus soft tissue emphysema or organized fluid collection. IMPRESSION: 1. No acute displaced fracture or dislocation. 2. Anterior knee subcutaneus soft tissue edema. 3. Possible trace joint effusion. Electronically Signed   By: Iven Finn M.D.   On: 03/09/2021 03:35   MR KNEE LEFT WO CONTRAST  Result Date: 03/10/2021 CLINICAL DATA:  Knee pain and swelling EXAM: MRI OF THE LEFT KNEE WITHOUT CONTRAST TECHNIQUE: Multiplanar, multisequence MR imaging of the knee was performed. No intravenous contrast was administered. COMPARISON:  None. FINDINGS: MENISCI Medial meniscus:  Unremarkable Lateral meniscus:  Unremarkable LIGAMENTS Cruciates:  Unremarkable Collaterals:  Unremarkable CARTILAGE Patellofemoral:  Unremarkable Medial:  Unremarkable Lateral:  Unremarkable Joint:  Small but abnormal knee effusion. Popliteal Fossa:  Unremarkable Extensor Mechanism:  Unremarkable Bones: No significant extra-articular osseous abnormalities identified. Other: Substantial prepatellar edema with suspicion for mild prepatellar bursitis for example on image 19 series 19. IMPRESSION: 1. Mild prepatellar bursitis with surrounding prepatellar infiltrative edema. 2. Small but abnormal knee effusion. Electronically Signed   By: Van Clines M.D.   On: 03/10/2021 18:03   US Venous Img Lower Unilateral Left  Result Date: 03/08/2021 CLINICAL DATA:  Calf and knee  pain for 3 days. EXAM: LEFT LOWER EXTREMITY VENOUS DOPPLER ULTRASOUND TECHNIQUE: Gray-scale sonography with compression, as well as color and duplex ultrasound, were performed to evaluate the deep venous system(s) from the level of the common femoral vein through the popliteal and proximal calf veins. COMPARISON:  None. FINDINGS: VENOUS Normal compressibility of the common femoral, superficial femoral, and popliteal veins, as well as the visualized calf veins. Visualized portions of profunda femoral vein and great saphenous vein unremarkable. No filling defects to suggest DVT on grayscale or color Doppler imaging. Doppler waveforms show normal direction of venous flow, normal respiratory plasticity and response to augmentation. Limited views of the contralateral common femoral vein are unremarkable. OTHER None. Limitations: none IMPRESSION: No evidence of left lower extremity DVT. Electronically Signed   By: Keith Rake M.D.   On: 03/08/2021 22:36      Subjective: Minimal pain.  Overall has no new complaints  Discharge Exam: Vitals:   03/12/21 0438 03/12/21 0741  BP: 122/77 (!) 131/96  Pulse: (!) 51 (!) 54  Resp: 17 16  Temp: 98 F (36.7 C) 98.9 F (37.2  C)  SpO2: 100% 100%   Vitals:   03/11/21 1921 03/11/21 2310 03/12/21 0438 03/12/21 0741  BP: 129/71 117/69 122/77 (!) 131/96  Pulse: 64 (!) 55 (!) 51 (!) 54  Resp: 18 18 17 16   Temp: 98.2 F (36.8 C) 98.3 F (36.8 C) 98 F (36.7 C) 98.9 F (37.2 C)  TempSrc: Oral Oral Oral Oral  SpO2: 97% 100% 100% 100%  Weight:      Height:        General: Pt is alert, awake, not in acute distress Cardiovascular: RRR, S1/S2 +, no rubs, no gallops Respiratory: CTA bilaterally, no wheezing, no rhonchi Abdominal: Soft, NT, ND, bowel sounds + Extremities: no edema    The results of significant diagnostics from this hospitalization (including imaging, microbiology, ancillary and laboratory) are listed below for reference.      Microbiology: Recent Results (from the past 240 hour(s))  Resp Panel by RT-PCR (Flu A&B, Covid) Nasopharyngeal Swab     Status: None   Collection Time: 03/10/21  6:47 AM   Specimen: Nasopharyngeal Swab; Nasopharyngeal(NP) swabs in vial transport medium  Result Value Ref Range Status   SARS Coronavirus 2 by RT PCR NEGATIVE NEGATIVE Final    Comment: (NOTE) SARS-CoV-2 target nucleic acids are NOT DETECTED.  The SARS-CoV-2 RNA is generally detectable in upper respiratory specimens during the acute phase of infection. The lowest concentration of SARS-CoV-2 viral copies this assay can detect is 138 copies/mL. A negative result does not preclude SARS-Cov-2 infection and should not be used as the sole basis for treatment or other patient management decisions. A negative result may occur with  improper specimen collection/handling, submission of specimen other than nasopharyngeal swab, presence of viral mutation(s) within the areas targeted by this assay, and inadequate number of viral copies(<138 copies/mL). A negative result must be combined with clinical observations, patient history, and epidemiological information. The expected result is Negative.  Fact Sheet for Patients:  EntrepreneurPulse.com.au  Fact Sheet for Healthcare Providers:  IncredibleEmployment.be  This test is no t yet approved or cleared by the [place] FDA and  has been authorized for detection and/or diagnosis of SARS-CoV-2 by FDA under an Emergency Use Authorization (EUA). This EUA will remain  in effect (meaning this test can be used) for the duration of the COVID-19 declaration under Section 564(b)(1) of the Act, 21 U.S.C.section 360bbb-3(b)(1), unless the authorization is terminated  or revoked sooner.       Influenza A by PCR NEGATIVE NEGATIVE Final   Influenza B by PCR NEGATIVE NEGATIVE Final    Comment: (NOTE) The Xpert Xpress SARS-CoV-2/FLU/RSV plus assay is  intended as an aid in the diagnosis of influenza from Nasopharyngeal swab specimens and should not be used as a sole basis for treatment. Nasal washings and aspirates are unacceptable for Xpert Xpress SARS-CoV-2/FLU/RSV testing.  Fact Sheet for Patients: EntrepreneurPulse.com.au  Fact Sheet for Healthcare Providers: IncredibleEmployment.be  This test is not yet approved or cleared by the [place] FDA and has been authorized for detection and/or diagnosis of SARS-CoV-2 by FDA under an Emergency Use Authorization (EUA). This EUA will remain in effect (meaning this test can be used) for the duration of the COVID-19 declaration under Section 564(b)(1) of the Act, 21 U.S.C. section 360bbb-3(b)(1), unless the authorization is terminated or revoked.  Performed at [facility], [address] [place], [place] [address]      Labs: BNP (last 3 results) No results for input(s): BNP in the last 8760 hours. Basic Metabolic  Panel: Recent Labs  Lab 03/10/21 0025 03/11/21 0641  NA 137 137  K 3.7 4.0  CL 105 107  CO2 22 25  GLUCOSE 112* 118*  BUN 24* 17  CREATININE 1.92* 0.95  CALCIUM 9.1 8.7*   Liver Function Tests: Recent Labs  Lab 03/10/21 0025  AST 22  ALT 20  ALKPHOS 56  BILITOT 0.7  PROT 8.3*  ALBUMIN 4.2   No results for input(s): LIPASE, AMYLASE in the last 168 hours. No results for input(s): AMMONIA in the last 168 hours. CBC: Recent Labs  Lab 03/10/21 0025 03/11/21 0641  WBC 6.7 8.5  NEUTROABS 4.0  --   HGB 12.2* 11.5*  HCT 36.1* 34.4*  MCV 84.0 85.1  PLT 227 211   Cardiac Enzymes: No results for input(s): CKTOTAL, CKMB, CKMBINDEX, TROPONINI in the last 168 hours. BNP: Invalid input(s): POCBNP CBG: No results for input(s): GLUCAP in the last 168 hours. D-Dimer No results for input(s): DDIMER in the last 72 hours. Hgb A1c No results for input(s): HGBA1C in the last 72 hours. Lipid Profile No  results for input(s): CHOL, HDL, LDLCALC, TRIG, CHOLHDL, LDLDIRECT in the last 72 hours. Thyroid function studies No results for input(s): TSH, T4TOTAL, T3FREE, THYROIDAB in the last 72 hours.  Invalid input(s): FREET3 Anemia work up No results for input(s): VITAMINB12, FOLATE, FERRITIN, TIBC, IRON, RETICCTPCT in the last 72 hours. Urinalysis No results found for: COLORURINE, APPEARANCEUR, [place], [place], GLUCOSEU, [place], [place], [place], PROTEINUR, UROBILINOGEN, NITRITE, LEUKOCYTESUR Sepsis Labs Invalid input(s): PROCALCITONIN,  WBC,  LACTICIDVEN Microbiology Recent Results (from the past 240 hour(s))  Resp Panel by RT-PCR (Flu A&B, Covid) Nasopharyngeal Swab     Status: None   Collection Time: 03/10/21  6:47 AM   Specimen: Nasopharyngeal Swab; Nasopharyngeal(NP) swabs in vial transport medium  Result Value Ref Range Status   SARS Coronavirus 2 by RT PCR NEGATIVE NEGATIVE Final    Comment: (NOTE) SARS-CoV-2 target nucleic acids are NOT DETECTED.  The SARS-CoV-2 RNA is generally detectable in upper respiratory specimens during the acute phase of infection. The lowest concentration of SARS-CoV-2 viral copies this assay can detect is 138 copies/mL. A negative result does not preclude SARS-Cov-2 infection and should not be used as the sole basis for treatment or other patient management decisions. A negative result may occur with  improper specimen collection/handling, submission of specimen other than nasopharyngeal swab, presence of viral mutation(s) within the areas targeted by this assay, and inadequate number of viral copies(<138 copies/mL). A negative result must be combined with clinical observations, patient history, and epidemiological information. The expected result is Negative.  Fact Sheet for Patients:  EntrepreneurPulse.com.au  Fact Sheet for Healthcare Providers:  IncredibleEmployment.be  This test is no t yet approved or  cleared by the [place] FDA and  has been authorized for detection and/or diagnosis of SARS-CoV-2 by FDA under an Emergency Use Authorization (EUA). This EUA will remain  in effect (meaning this test can be used) for the duration of the COVID-19 declaration under Section 564(b)(1) of the Act, 21 U.S.C.section 360bbb-3(b)(1), unless the authorization is terminated  or revoked sooner.       Influenza A by PCR NEGATIVE NEGATIVE Final   Influenza B by PCR NEGATIVE NEGATIVE Final    Comment: (NOTE) The Xpert Xpress SARS-CoV-2/FLU/RSV plus assay is intended as an aid in the diagnosis of influenza from Nasopharyngeal swab specimens and should not be used as a sole basis for treatment. Nasal washings and aspirates are unacceptable for Xpert  Xpress SARS-CoV-2/FLU/RSV testing.  Fact Sheet for Patients: EntrepreneurPulse.com.au  Fact Sheet for Healthcare Providers: IncredibleEmployment.be  This test is not yet approved or cleared by the [place] FDA and has been authorized for detection and/or diagnosis of SARS-CoV-2 by FDA under an Emergency Use Authorization (EUA). This EUA will remain in effect (meaning this test can be used) for the duration of the COVID-19 declaration under Section 564(b)(1) of the Act, 21 U.S.C. section 360bbb-3(b)(1), unless the authorization is terminated or revoked.  Performed at [facility], [address], [place],  [address]      Time coordinating discharge: Over 30 minutes  SIGNED:   Nolberto Hanlon, MD  [facility] 03/12/2021, 12:36 PM Pager   If 7PM-7AM, please contact night-coverage [email or web address] Password [email or web address]

## 2021-03-12 NOTE — Progress Notes (Signed)
Reviewed discharge Instructions with pt. Pt verbalized understanding. Pt discharged with personal belongings. Staff walked pt out. Pt transported via family vehicle.

## 2021-03-12 NOTE — Progress Notes (Signed)
Patient ID: Travis Huynh, male   DOB: [birth year], 55 y.o.   MRN: [id number]  Subjective: The patient notes further improvement in his left knee symptoms as compared to yesterday.  He is able to get out of bed and move around without significant difficulty.  He has no new complaints.   Objective: Vital signs in last 24 hours: Temp:  [98 F (36.7 C)-98.9 F (37.2 C)] 98.9 F (37.2 C) (07/06 0741) Pulse Rate:  [51-64] 54 (07/06 0741) Resp:  [16-18] 16 (07/06 0741) BP: (117-131)/(69-96) 131/96 (07/06 0741) SpO2:  [97 %-100 %] 100 % (07/06 0741)  Intake/Output from previous day: No intake/output data recorded. Intake/Output this shift: Total I/O In: 240 [P.O.:240] Out: -   Recent Labs    03/10/21 0025 03/11/21 0641  HGB 12.2* 11.5*   Recent Labs    03/10/21 0025 03/11/21 0641  WBC 6.7 8.5  RBC 4.30 4.04*  HCT 36.1* 34.4*  PLT 227 211   Recent Labs    03/10/21 0025 03/11/21 0641  NA 137 137  K 3.7 4.0  CL 105 107  CO2 22 25  BUN 24* 17  CREATININE 1.92* 0.95  GLUCOSE 112* 118*  CALCIUM 9.1 8.7*   Recent Labs    03/10/21 0939  INR 1.1    Physical Exam: Orthopedic examination is limited to the left knee and lower extremity.  There is at most a trace of residual swelling over the anterior aspect of the knee, but there does not appear to be any obvious effusion.  He has some minimal residual tenderness to palpation over the prepatellar region, but no medial or lateral joint line tenderness.  He is able to flex his knee from 0 to 110 degrees actively with only minimal discomfort at maximal flexion.  He remains neurovascularly intact to the left lower extremity and foot.  Assessment: 1.  Sterile prepatellar bursitis left knee. 2.  Probable gout left knee, improving symptomatically.  Plan: The treatment options are reviewed with the patient.  He is to continue to receive his gout medication as recommended by the hospitalist.  He may progress in his activities as  symptoms permit.  He may be discharged when cleared medically by the hospitalist.  Thank you for asking me to participate in the care of this gentleman.  I will sign off at this time.  He may follow-up with me on an as necessary basis.  If you need me for any further orthopedic input during this hospitalization, please reconsult me.  Marshall Cork Aren Cherne 03/12/2021, 1:57 PM

## 2021-03-19 ENCOUNTER — Ambulatory Visit: Payer: 59 | Admitting: Family Medicine

## 2021-03-19 ENCOUNTER — Other Ambulatory Visit: Payer: Self-pay

## 2021-03-19 VITALS — BP 136/74 | HR 68 | Temp 98.3°F | Resp 16 | Ht 67.0 in | Wt 161.6 lb

## 2021-03-19 DIAGNOSIS — M79605 Pain in left leg: Secondary | ICD-10-CM | POA: Diagnosis not present

## 2021-03-19 DIAGNOSIS — N179 Acute kidney failure, unspecified: Secondary | ICD-10-CM

## 2021-03-19 DIAGNOSIS — M109 Gout, unspecified: Secondary | ICD-10-CM | POA: Diagnosis not present

## 2021-03-19 DIAGNOSIS — M79604 Pain in right leg: Secondary | ICD-10-CM

## 2021-03-19 DIAGNOSIS — M25562 Pain in left knee: Secondary | ICD-10-CM

## 2021-03-19 DIAGNOSIS — I10 Essential (primary) hypertension: Secondary | ICD-10-CM

## 2021-03-19 LAB — BASIC METABOLIC PANEL
BUN: 23 mg/dL (ref 6–23)
CO2: 24 mEq/L (ref 19–32)
Calcium: 8.8 mg/dL (ref 8.4–10.5)
Chloride: 107 mEq/L (ref 96–112)
Creatinine, Ser: 1.22 mg/dL (ref 0.40–1.50)
GFR: 66.67 mL/min (ref >60.00)
Glucose, Bld: 90 mg/dL (ref 70–99)
Potassium: 4 mEq/L (ref 3.5–5.1)
Sodium: 141 mEq/L (ref 135–145)

## 2021-03-19 LAB — POCT URINALYSIS DIP (MANUAL ENTRY)
Bilirubin, UA: NEGATIVE
Glucose, UA: NEGATIVE mg/dL
Ketones, POC UA: NEGATIVE mg/dL
Leukocytes, UA: NEGATIVE
Nitrite, UA: NEGATIVE
Protein Ur, POC: NEGATIVE mg/dL
Spec Grav, UA: 1.02 (ref 1.010–1.025)
Urobilinogen, UA: 0.2 E.U./dL
pH, UA: 5 (ref 5.0–8.0)

## 2021-03-19 LAB — CK: Total CK: 47 U/L (ref 7–232)

## 2021-03-19 MED ORDER — COLCHICINE 0.6 MG PO TABS
0.6000 mg | ORAL_TABLET | Freq: Every day | ORAL | 1 refills | Status: DC
  Filled 2021-03-19: qty 15, 15d supply, fill #0
  Filled 2021-04-01: qty 15, 15d supply, fill #1

## 2021-03-19 NOTE — Patient Instructions (Addendum)
I will check kidney test today to make sure it remains stable, continue to drink plenty of fluids and avoid nsaids like advil/alleve.  Tylenol is ok.   Do not start hydrochlorothiazide yet. It may also worsen gout. Continue.  metoprolol and amlodipine for now.   For gout, colchicine if needed for gout - twice per day initially then once per day until improved.  Recheck uric acid (gout test) in 1 month.   I will check muscle test today. Rest and fluids at home today, gentle range of motion of legs today. Tylenol or 1/2-1 percocet if needed temporarily. Out of work until improving.  Return to the clinic or go to the nearest emergency room if any of your symptoms worsen or new symptoms occur.

## 2021-03-19 NOTE — Progress Notes (Signed)
Subjective:  Patient ID: Travis Huynh, male    DOB: [birth year]  Age: 55 y.o. MRN: [id number]  CC:  Chief Complaint  Patient presents with   Leg Pain    Pt reports pain LT leg on the Lt side of knee had most pain, now having cramping this morning, pt was noted to have gout flare while admitted to the hospital   Hypertension    Pt advised to stop HCTZ on admit and was advised to ask PCP when to restart     HPI Travis Huynh presents for   Here for hospital follow-up.  Admitted July 4 through July 6 with left knee pain due to acute gout flare, prepatellar bursitis, acute kidney injury superimposed on chronic kidney disease stage II.   Left knee pain Possible gout, elevated uric acid and based on exam/history diagnosed with likely gout because of pain.  Orthopedics consulted during hospitalization, improving symptomatically, treated with oral steroids, prednisone 40 mg daily and colchicine.  Ace bandage as needed.  Oxycodone 5/325 #7 filled on 03/12/2021. Has 3 left.  Stopped prednisone about 3 days ago. Knee pain was improving. Not needing any pain meds.  Doing well until this am. Cramping in both legs today. Thighs and calves sore both sides. Back at work 4 days, standing, no new activities.  No hx of rhabdo. No dark urine noted.  No CP/dyspnea, no calf swelling.  No back pain.  No bowel or bladder incontinence, no saddle anesthesia, no lower extremity weakness.  Upper body ok.  Tx: none.   Acute kidney injury on CKD stage II Creatinine 1.92 on arrival with baseline around 1.07, improved to 0.95 with IV hydration.  Thought to be due to mild dehydration, also on Bactrim.  HCTZ was held during admission.  Avoid Bactrim.  He was continued on home amlodipine (now 10mg ) and metoprolol for hypertension. Drinking fluids at home, urinating normally. No nsaids. Was taking prior to hospitalization. home readings 130/70 range.   Lab Results  Component Value Date   CREATININE 0.95 03/11/2021    BP Readings from Last 3 Encounters:  03/19/21 136/74  03/12/21 116/73  03/09/21 112/73    History Patient Active Problem List   Diagnosis Date Noted   Left knee pain 03/10/2021   Hypertension    Tobacco abuse    Acute renal failure superimposed on stage 2 chronic kidney disease (HCC)    Past Medical History:  Diagnosis Date   CKD (chronic kidney disease), stage II    Hypertension    Palpitations    Past Surgical History:  Procedure Laterality Date   MOUTH SURGERY     No Known Allergies Prior to Admission medications   Medication Sig Start Date End Date Taking? Authorizing Provider  amLODipine (NORVASC) 10 MG tablet Take 1 tablet (10 mg total) by mouth daily. 03/13/21 04/12/21 Yes Nolberto Hanlon, MD  famotidine (PEPCID) 20 MG tablet Take 1 tablet (20 mg total) by mouth daily for 7 days. 03/12/21 03/19/21 Yes Nolberto Hanlon, MD  HYDROcodone-acetaminophen (NORCO/VICODIN) 5-325 MG tablet Take 2 tablets by mouth every 6 (six) hours as needed for moderate pain or severe pain. 03/09/21  Yes Hinda Kehr, MD  metoprolol tartrate (LOPRESSOR) 25 MG tablet Take 1 tablet (25 mg total) by mouth 2 (two) times daily as needed (palpitations). 01/30/21  Yes Freada Bergeron, MD  colchicine 0.6 MG tablet Take 1 tablet (0.6 mg total) by mouth 2 (two) times daily for 3 days. 03/12/21 03/15/21  Nolberto Hanlon, MD  oxyCODONE-acetaminophen (PERCOCET/ROXICET) 5-325 MG tablet Take 1 tablet by mouth every 4 (four) hours as needed for up to 7 doses for severe pain. Patient not taking: Reported on 03/19/2021 03/12/21   Nolberto Hanlon, MD   Social History   Socioeconomic History   Marital status: Married    Spouse name: Not on file   Number of children: Not on file   Years of education: Not on file   Highest education level: Not on file  Occupational History   Not on file  Tobacco Use   Smoking status: Every Day    Packs/day: 0.25    Pack years: 0.00    Types: Cigarettes   Smokeless tobacco: Never   Tobacco  comments:    pt smokes about 2 cigarettes a day   Vaping Use   Vaping Use: Never used  Substance and Sexual Activity   Alcohol use: Yes    Alcohol/week: 1.0 standard drink    Types: 1 Cans of beer per week    Comment: occasionally   Drug use: Never   Sexual activity: Yes  Other Topics Concern   Not on file  Social History Narrative   Not on file   Social Determinants of Health   Financial Resource Strain: Not on file  Food Insecurity: Not on file  Transportation Needs: Not on file  Physical Activity: Not on file  Stress: Not on file  Social Connections: Not on file  Intimate Partner Violence: Not on file    Review of Systems Per HPI.   Objective:   Vitals:   03/19/21 1048  BP: 136/74  Pulse: 68  Resp: 16  Temp: 98.3 F (36.8 C)  TempSrc: Temporal  SpO2: 97%  Weight: 161 lb 9.6 oz (73.3 kg)  Height: 5\' 7"  (1.702 m)     Physical Exam Vitals reviewed.  Constitutional:      Appearance: He is well-developed.  HENT:     Head: Normocephalic and atraumatic.  Neck:     Vascular: No carotid bruit or JVD.  Cardiovascular:     Rate and Rhythm: Normal rate and regular rhythm.     Heart sounds: Normal heart sounds. No murmur heard. Pulmonary:     Effort: Pulmonary effort is normal.     Breath sounds: Normal breath sounds. No rales.  Abdominal:     General: Abdomen is flat. There is no distension.     Palpations: Abdomen is soft.     Tenderness: There is no abdominal tenderness.  Musculoskeletal:     Right lower leg: No edema.     Left lower leg: No edema.     Comments: Diffuse discomfort across thighs, including quads and hamstrings, as well as right greater than left calf without focal cords or focal Swelling noted.  Muscle soft.  Trace nonpitting edema at ankles.  Bilaterally.  Slight discomfort in left with Homans, negative on right.  Pain-free range of motion of knees, ankles, hips.  Sensate to toes bilaterally with DP pulse 2+.  Left knee no focal bony  tenderness, or appreciable swelling/erythema  Skin:    General: Skin is warm and dry.  Neurological:     Mental Status: He is alert and oriented to person, place, and time.  Psychiatric:        Mood and Affect: Mood normal.       Assessment & Plan:  [name] is a 55 y.o. male . Essential hypertension - Plan: Basic metabolic panel  -Stable on current amlodipine, metoprolol, including  with current pain.  Home readings consistent.  Hold HCTZ for now given gout history and AKI/volume depletion as above.  Continue to monitor hypertension, recheck 1 month.  Potential side effects of higher dose amlodipine discussed with pedal edema.  Minimal at present.  Acute pain of left knee Acute gout of left knee, unspecified cause.  -Now improved, suspected gout, resolved.  Plan for recheck 1 month with uric acid, colchicine  prescribed if return of flare with dosing instructions given.  Bilateral leg pain - Plan: POCT urinalysis dipstick, CK, CANCELED: CK  -acute onset of bilateral myalgias, diffusely lower legs.  No known injury or change in activity, but has been back at work with standing the past 4 days.  - Less likely rhabdomyolysis but will check CPK, urinalysis.  Less likely DVT as bilateral symptoms and diffuse bilateral legs without focal swelling.  - Fluids, rest, Tylenol or short-term oxycodone (has some left) if needed for pain today with out of work for today, tomorrow if not improving.  RTC precautions if not resolving or ER if worsening.  No orders of the defined types were placed in this encounter.  Patient Instructions  I will check kidney test today to make sure it remains stable, continue to drink plenty of fluids and avoid nsaids like advil/alleve.  Tylenol is ok.   Do not start hydrochlorothiazide yet. It may also worsen gout. Continue.  metoprolol and amlodipine for now.   For gout, colchicine if needed for gout - twice per day initially then once per day until  improved.  Recheck uric acid (gout test) in 1 month.   I will check muscle test today. Rest and fluids at home today, gentle range of motion of legs today. Tylenol or 1/2-1 percocet if needed temporarily. Out of work until improving.  Return to the clinic or go to the nearest emergency room if any of your symptoms worsen or new symptoms occur.      Signed,   Merri Ray, MD [place] 03/19/21 11:43 AM

## 2021-03-30 ENCOUNTER — Other Ambulatory Visit: Payer: Self-pay | Admitting: Family Medicine

## 2021-03-30 DIAGNOSIS — R319 Hematuria, unspecified: Secondary | ICD-10-CM

## 2021-03-30 NOTE — Progress Notes (Signed)
Plan for lab only visit for urine microscopy, previous trace hematuria on urinalysis.

## 2021-04-01 ENCOUNTER — Other Ambulatory Visit: Payer: Self-pay

## 2021-04-01 ENCOUNTER — Other Ambulatory Visit: Payer: Self-pay | Admitting: Family Medicine

## 2021-04-01 MED FILL — Amlodipine Besylate Tab 10 MG (Base Equivalent): ORAL | 30 days supply | Qty: 30 | Fill #0 | Status: AC

## 2021-04-02 ENCOUNTER — Other Ambulatory Visit: Payer: Self-pay

## 2021-04-07 ENCOUNTER — Other Ambulatory Visit: Payer: Self-pay

## 2021-04-24 ENCOUNTER — Ambulatory Visit: Payer: 59 | Admitting: Family Medicine

## 2021-04-25 ENCOUNTER — Other Ambulatory Visit: Payer: Self-pay

## 2021-05-01 ENCOUNTER — Ambulatory Visit: Payer: 59 | Admitting: Family Medicine

## 2021-05-01 ENCOUNTER — Other Ambulatory Visit: Payer: Self-pay

## 2021-05-01 VITALS — BP 124/72 | HR 76 | Temp 98.1°F | Resp 16 | Ht 67.0 in | Wt 158.6 lb

## 2021-05-01 DIAGNOSIS — Z1211 Encounter for screening for malignant neoplasm of colon: Secondary | ICD-10-CM | POA: Diagnosis not present

## 2021-05-01 DIAGNOSIS — M109 Gout, unspecified: Secondary | ICD-10-CM

## 2021-05-01 DIAGNOSIS — Z23 Encounter for immunization: Secondary | ICD-10-CM

## 2021-05-01 DIAGNOSIS — R3121 Asymptomatic microscopic hematuria: Secondary | ICD-10-CM | POA: Diagnosis not present

## 2021-05-01 DIAGNOSIS — E790 Hyperuricemia without signs of inflammatory arthritis and tophaceous disease: Secondary | ICD-10-CM | POA: Diagnosis not present

## 2021-05-01 DIAGNOSIS — I10 Essential (primary) hypertension: Secondary | ICD-10-CM

## 2021-05-01 LAB — POCT URINALYSIS DIP (MANUAL ENTRY)
Bilirubin, UA: NEGATIVE
Blood, UA: NEGATIVE
Glucose, UA: NEGATIVE mg/dL
Ketones, POC UA: NEGATIVE mg/dL
Leukocytes, UA: NEGATIVE
Nitrite, UA: NEGATIVE
Protein Ur, POC: NEGATIVE mg/dL
Spec Grav, UA: 1.015 (ref 1.010–1.025)
Urobilinogen, UA: 0.2 E.U./dL
pH, UA: 5 (ref 5.0–8.0)

## 2021-05-01 LAB — URINALYSIS, MICROSCOPIC ONLY

## 2021-05-01 LAB — URIC ACID: Uric Acid, Serum: 8.9 mg/dL — ABNORMAL HIGH (ref 4.0–7.8)

## 2021-05-01 MED ORDER — AMLODIPINE BESYLATE 10 MG PO TABS
10.0000 mg | ORAL_TABLET | Freq: Every day | ORAL | 1 refills | Status: DC
Start: 2021-05-01 — End: 2021-08-06
  Filled 2021-05-01: qty 90, 90d supply, fill #0

## 2021-05-01 MED ORDER — METOPROLOL TARTRATE 25 MG PO TABS
25.0000 mg | ORAL_TABLET | Freq: Two times a day (BID) | ORAL | 3 refills | Status: DC | PRN
  Filled 2021-05-01: qty 60, 30d supply, fill #0
  Filled 2021-06-11: qty 60, 30d supply, fill #1
  Filled 2021-07-15: qty 60, 30d supply, fill #2

## 2021-05-01 NOTE — Progress Notes (Signed)
Subjective:  Patient ID: Travis Huynh, male    DOB: 1965/09/30  Age: 55 y.o. MRN: [id number]  CC:  Chief Complaint  Patient presents with   Gout    Pt was seen for gout of Lt knee last ov pt notes much better once he was able to restart his medications.    Hypertension    Pt advised be seen 1 month for f/u advised hold HCTZ for time and continue Amlodipine and metoprolol.    Referral    Pt is due for 1 year follow up colonoscopy due to incomplete exam last year pt would like to get this scheduled    Immunizations    Pt due for 2nd shingles vaccine and would like to complete this today if possible     HPI Travis Huynh presents for   Hypertension: With chronic kidney disease, AKI on CKD stage II during hospitalization in July.  Creatinine stable last visit at 1.22, minimal change from 0.95 previously.  eGFR 66. amlodipine 10 mg daily, metoprolol 25 mg twice daily.  Hydrochlorothiazide held due to history of gout. Home readings: none.  BP Readings from Last 3 Encounters:  05/01/21 124/72  03/19/21 136/74  03/12/21 116/73   Lab Results  Component Value Date   CREATININE 1.22 03/19/2021   Gout: Last flare: July, ER evaluation, than admission.  Treated with prednisone, oxycodone. Daily meds: None at this time, colchicine if needed for Prn med.  Took colchicine for a few days after last visit - pains resolved.    Lab Results  Component Value Date   LABURIC 10.4 (H) 03/10/2021   Hematuria Trace blood noted on urinalysis July 13.  Plan for urine micro, repeat testing.  Denies gross hematuria.  Myalgias noted at that visit but normal CPK.   Health maintenance Needs referral for colonoscopy, fair prep on previous study in 04/10/20.  Shingrix #2 today. Tolerated 1st vaccine in May.   Immunization History  Administered Date(s) Administered   PFIZER(Purple Top)SARS-COV-2 Vaccination 08/29/2019, 09/19/2019, 06/06/2020   Tdap 02/21/2020   Zoster Recombinat (Shingrix)  01/27/2021     History Patient Active Problem List   Diagnosis Date Noted   Left knee pain 03/10/2021   Hypertension    Tobacco abuse    Acute renal failure superimposed on stage 2 chronic kidney disease (HCC)    Past Medical History:  Diagnosis Date   CKD (chronic kidney disease), stage II    Hypertension    Palpitations    Past Surgical History:  Procedure Laterality Date   MOUTH SURGERY     No Known Allergies Prior to Admission medications   Medication Sig Start Date End Date Taking? Authorizing Provider  amLODipine (NORVASC) 10 MG tablet Take 1 tablet (10 mg total) by mouth daily. 04/01/21 05/10/21 Yes Wendie Agreste, MD  colchicine 0.6 MG tablet Take 1 tablet (0.6 mg total) by mouth daily. During gout flare with initial dose 1 tablet twice daily for day 1. 03/19/21  Yes Wendie Agreste, MD  metoprolol tartrate (LOPRESSOR) 25 MG tablet Take 1 tablet (25 mg total) by mouth 2 (two) times daily as needed (palpitations). 01/30/21  Yes Freada Bergeron, MD  famotidine (PEPCID) 20 MG tablet Take 1 tablet (20 mg total) by mouth daily for 7 days. 03/12/21 03/19/21  Nolberto Hanlon, MD  HYDROcodone-acetaminophen (NORCO/VICODIN) 5-325 MG tablet Take 2 tablets by mouth every 6 (six) hours as needed for moderate pain or severe pain. Patient not taking: Reported on 05/01/2021 03/09/21  Hinda Kehr, MD  oxyCODONE-acetaminophen (PERCOCET/ROXICET) 5-325 MG tablet Take 1 tablet by mouth every 4 (four) hours as needed for up to 7 doses for severe pain. Patient not taking: Reported on 05/01/2021 03/12/21   Nolberto Hanlon, MD   Social History   Socioeconomic History   Marital status: Married    Spouse name: Not on file   Number of children: Not on file   Years of education: Not on file   Highest education level: Not on file  Occupational History   Not on file  Tobacco Use   Smoking status: Every Day    Packs/day: 0.25    Types: Cigarettes   Smokeless tobacco: Never   Tobacco comments:     pt smokes about 2 cigarettes a day   Vaping Use   Vaping Use: Never used  Substance and Sexual Activity   Alcohol use: Yes    Alcohol/week: 1.0 standard drink    Types: 1 Cans of beer per week    Comment: occasionally   Drug use: Never   Sexual activity: Yes  Other Topics Concern   Not on file  Social History Narrative   Not on file   Social Determinants of Health   Financial Resource Strain: Not on file  Food Insecurity: Not on file  Transportation Needs: Not on file  Physical Activity: Not on file  Stress: Not on file  Social Connections: Not on file  Intimate Partner Violence: Not on file    Review of Systems  Constitutional:  Negative for fatigue and unexpected weight change.  Eyes:  Negative for visual disturbance.  Respiratory:  Negative for cough, chest tightness and shortness of breath.   Cardiovascular:  Negative for chest pain, palpitations and leg swelling.  Gastrointestinal:  Negative for abdominal pain and blood in stool.  Neurological:  Negative for dizziness, light-headedness and headaches.    Objective:   Vitals:   05/01/21 1008  BP: 124/72  Pulse: 76  Resp: 16  Temp: 98.1 F (36.7 C)  TempSrc: Temporal  SpO2: 98%  Weight: 158 lb 9.6 oz (71.9 kg)  Height: $Remove'5\' 7"'SmdyKsw$  (1.702 m)     Physical Exam Vitals reviewed.  Constitutional:      Appearance: He is well-developed.  HENT:     Head: Normocephalic and atraumatic.  Neck:     Vascular: No carotid bruit or JVD.  Cardiovascular:     Rate and Rhythm: Normal rate and regular rhythm.     Heart sounds: Normal heart sounds. No murmur heard. Pulmonary:     Effort: Pulmonary effort is normal.     Breath sounds: Normal breath sounds. No rales.  Musculoskeletal:        General: No swelling or tenderness.     Right lower leg: No edema.     Left lower leg: No edema.     Comments: Knees, extremities nontender.   Skin:    General: Skin is warm and dry.  Neurological:     Mental Status: He is alert and  oriented to person, place, and time.  Psychiatric:        Mood and Affect: Mood normal.  Results for orders placed or performed in visit on 05/01/21  Urine Microscopic  Result Value Ref Range   WBC, UA 0-2/hpf 0-2/hpf   RBC / HPF 0-2/hpf 0-2/hpf   Squamous Epithelial / LPF Rare(0-4/hpf) Rare(0-4/hpf)  Uric acid  Result Value Ref Range   Uric Acid, Serum 8.9 (H) 4.0 - 7.8 mg/dL  POCT urinalysis  dipstick  Result Value Ref Range   Color, UA yellow yellow   Clarity, UA clear clear   Glucose, UA negative negative mg/dL   Bilirubin, UA negative negative   Ketones, POC UA negative negative mg/dL   Spec Grav, UA 1.015 1.010 - 1.025   Blood, UA negative negative   pH, UA 5.0 5.0 - 8.0   Protein Ur, POC negative negative mg/dL   Urobilinogen, UA 0.2 0.2 or 1.0 E.U./dL   Nitrite, UA Negative Negative   Leukocytes, UA Negative Negative      Assessment & Plan:  Travis Huynh is a 55 y.o. male . Essential hypertension - Plan: amLODipine (NORVASC) 10 MG tablet, metoprolol tartrate (LOPRESSOR) 25 MG tablet  -Stable, continue amlodipine, metoprolol.  Will not start HCTZ as well-controlled and with history of gout.  Need for shingles vaccine - Plan: Varicella-zoster vaccine IM  Acute gout of left knee, unspecified cause - Plan: Uric acid Elevated uric acid in blood - Plan: Uric acid  -Repeat uric acid now as not in current flare -improved but still above typical for gout.  Continue colchicine if needed for breakthrough flare but would consider allopurinol for prevention if recurrence of flares.  Expect he will do better off HCTZ..  Asymptomatic microscopic hematuria - Plan: POCT urinalysis dipstick, Urine Microscopic  -Asymptomatic, possible trace hematuria on previous urinalysis, repeat urinalysis with micro -reassuring, no significant hematuria.  Screen for colon cancer - Plan: Ambulatory referral to Gastroenterology   Meds ordered this encounter  Medications   amLODipine (NORVASC)  10 MG tablet    Sig: Take 1 tablet (10 mg total) by mouth daily.    Dispense:  90 tablet    Refill:  1   metoprolol tartrate (LOPRESSOR) 25 MG tablet    Sig: Take 1 tablet (25 mg total) by mouth 2 (two) times daily as needed (palpitations).    Dispense:  60 tablet    Refill:  3   Patient Instructions  Depending on uric acid, I would consider starting allopurinol once per day to help prevent gout flare. Colchicine if needed for gout flare.  No change in blood pressure meds for now.  If any concerns on blood work or urine test I will let you know.  Recheck 6 months but let me know if there are questions sooner.  Take care.     Signed,   Merri Ray, MD [place] 05/01/21 11:15 AM

## 2021-05-01 NOTE — Patient Instructions (Addendum)
Depending on uric acid, I would consider starting allopurinol once per day to help prevent gout flare. Colchicine if needed for gout flare.  No change in blood pressure meds for now.  If any concerns on blood work or urine test I will let you know.  Recheck 6 months but let me know if there are questions sooner.  Take care.

## 2021-05-05 ENCOUNTER — Other Ambulatory Visit: Payer: Self-pay

## 2021-06-02 NOTE — Progress Notes (Signed)
Cardiology Office Note:    Date:  06/03/2021   ID:  Travis Huynh, DOB May 23, 1966, MRN [id number]  PCP:  Travis Agreste, MD   [facility] Cardiologist:  Travis Bergeron, MD     Referring MD: Travis Agreste, MD   Chief Complaint:  F/u on palpitations; L arm pain    Patient Profile:   Travis Huynh is a 55 y.o. male with:  Palpitations  Supraventricular Tachycardia  Monitor 3/22: 2 runs of brief NS Supraventricular Tachycardia (longest 13 beats) Hypertension  Anxiety  Hyperlipidemia  +Cigs   Prior CV studies: LONG TERM MONITOR (3-7 DAYS) INTERPRETATION 11/22/2020 Narrative  Patch wear time was 6 days and 13 hours.  Predominant rhythm was NSR with average HR 59bpm; ranging from 40bpm-132bpm  There were 2 runs of SVT with fastest/longest interval 13 beats with max rate of 132bpm  Rare SVE <1%, rare PVCs <1%  No afib, VT, or significant pauses  Patient triggered event correlates with NSR.   History of Present Illness: Mr. Travis Huynh was last seen by Dr. Johney Huynh in 5/22.  He was still having some palpitations with stress and missing doses of his metoprolol tartrate. He was changed to metoprolol succinate + metoprolol tartrate PRN.  He returns for f/u.  He is here with his wife.  He has continued to take Metoprolol tartrate 25 mg twice daily.  His palpitations have been well controlled.  He continues to have occasional L arm pain or heaviness.  This is not related to exertion.  It sometimes wakes him up and he cannot go back to sleep.  He does not have known neck problems. He has not had chest pain, shortness of breath, syncope.  He does smoke and has a FHx of CAD.  His mom died of a MI in her 62s.          Past Medical History:  Diagnosis Date   CKD (chronic kidney disease), stage II    Hypertension    Palpitations    Current Medications: Current Meds  Medication Sig   amLODipine (NORVASC) 10 MG tablet Take 1 tablet (10 mg total) by mouth  daily.   colchicine 0.6 MG tablet Take 0.6 mg by mouth as needed (gout flare up (pain)).   metoprolol tartrate (LOPRESSOR) 25 MG tablet Take 1 tablet (25 mg total) by mouth 2 (two) times daily as needed (palpitations). (Patient taking differently: Take 25 mg by mouth 2 (two) times daily.)   Allergies:   Patient has no known allergies.   Social History   Tobacco Use   Smoking status: Every Day    Packs/day: 0.25    Types: Cigarettes   Smokeless tobacco: Never   Tobacco comments:    pt smokes about 2 cigarettes a day   Vaping Use   Vaping Use: Never used  Substance Use Topics   Alcohol use: Yes    Alcohol/week: 1.0 standard drink    Types: 1 Cans of beer per week    Comment: occasionally   Drug use: Never    Family Hx: The patient's family history includes Prostate cancer in his maternal uncle. There is no history of Colon cancer, Colon polyps, Esophageal cancer, or Stomach cancer.  Review of Systems  Constitutional: Negative for chills and fever.  Respiratory:  Negative for cough.   Gastrointestinal:  Positive for heartburn (relieved by antacids). Negative for diarrhea, hematochezia, melena and vomiting.  Genitourinary:  Negative for hematuria.    EKGs/Labs/Other Test Reviewed:  EKG:  EKG is not ordered today.  The ekg ordered today demonstrates n/a  Recent Labs: 10/31/2020: Magnesium 1.8 03/10/2021: ALT 20 03/11/2021: Hemoglobin 11.5; Platelets 211 03/19/2021: BUN 23; Creatinine, Ser 1.22; Potassium 4.0; Sodium 141   Recent Lipid Panel Lab Results  Component Value Date/Time   CHOL 206 (H) 01/27/2021 10:42 AM   CHOL 208 (H) 02/21/2020 10:58 AM   TRIG 184.0 (H) 01/27/2021 10:42 AM   HDL 45.80 01/27/2021 10:42 AM   HDL 51 02/21/2020 10:58 AM   LDLCALC 123 (H) 01/27/2021 10:42 AM   LDLCALC 134 (H) 02/21/2020 10:58 AM     Risk Assessment/Calculations:          Physical Exam:    VS:  BP 100/70   Pulse 60   Ht 5\' 7"  (1.702 m)   Wt 163 lb 3.2 oz (74 kg)   SpO2  98%   BMI 25.56 kg/m     Wt Readings from Last 3 Encounters:  06/03/21 163 lb 3.2 oz (74 kg)  05/01/21 158 lb 9.6 oz (71.9 kg)  03/19/21 161 lb 9.6 oz (73.3 kg)    Constitutional:      Appearance: Healthy appearance. Not in distress.  Neck:     Vascular: No carotid bruit. JVD normal.  Pulmonary:     Effort: Pulmonary effort is normal.     Breath sounds: No wheezing. No rales.  Cardiovascular:     Normal rate. Regular rhythm. Normal S1. Normal S2.      Murmurs: There is no murmur.  Edema:    Peripheral edema absent.  Abdominal:     Palpations: Abdomen is soft. There is no hepatomegaly.  Skin:    General: Skin is warm and dry.  Neurological:     General: No focal deficit present.     Mental Status: Alert and oriented to person, place and time.     Cranial Nerves: Cranial nerves are intact.       ASSESSMENT & PLAN:   1. Palpitations 2. Left arm pain 3. Family history of cardiovascular disease His palpitations are overall controlled on current beta-blocker Rx.  Continue metoprolol tartrate 25 mg twice daily.  His arm symptoms are likely non-cardiac.  However, this is concerning for him and his wife.  He has a FHx of CAD and continues to smoke cigarettes.  I have recommended proceeding with a plain exercise treadmill test to screen for ischemic heart disease.  If this is low risk, he can f/u with primary care to evaluate other causes of his arm symptoms.  F/u in 6 mos.   4. Essential hypertension BP is well controlled.  Continue amlodipine 10 mg once daily, metoprolol tartrate 25 mg twice daily.   5. Mixed hyperlipidemia Managed by primary care.     Shared Decision Making/Informed Consent The risks [chest pain, shortness of breath, cardiac arrhythmias, dizziness, blood pressure fluctuations, myocardial infarction, stroke/transient ischemic attack, and life-threatening complications (estimated to be 1 in 10,000)], benefits (risk stratification, diagnosing coronary artery  disease, treatment guidance) and alternatives of an exercise tolerance test were discussed in detail with Mr. Locker and he agrees to proceed.   Dispo:  Return in about 6 months (around 12/01/2021) for Routine Follow Up, w/ Dr. Johney Huynh.   Medication Adjustments/Labs and Tests Ordered: Current medicines are reviewed at length with the patient today.  Concerns regarding medicines are outlined above.  Tests Ordered: Orders Placed This Encounter  Procedures   Cardiac Stress Test: Informed Consent Details: Physician/Practitioner Attestation; Transcribe to  consent form and obtain patient signature   Exercise Tolerance Test   Medication Changes: No orders of the defined types were placed in this encounter.  Signed, Serres Dopp, PA-C  06/03/2021 9:10 AM    [facility] [address], [place], [place]  [address] Phone: [phone]; Fax: [phone]

## 2021-06-03 ENCOUNTER — Encounter: Payer: Self-pay | Admitting: Physician Assistant

## 2021-06-03 ENCOUNTER — Other Ambulatory Visit: Payer: Self-pay

## 2021-06-03 ENCOUNTER — Ambulatory Visit (INDEPENDENT_AMBULATORY_CARE_PROVIDER_SITE_OTHER): Payer: 59 | Admitting: Physician Assistant

## 2021-06-03 VITALS — BP 100/70 | HR 60 | Ht 67.0 in | Wt 163.2 lb

## 2021-06-03 DIAGNOSIS — Z720 Tobacco use: Secondary | ICD-10-CM

## 2021-06-03 DIAGNOSIS — I10 Essential (primary) hypertension: Secondary | ICD-10-CM | POA: Diagnosis not present

## 2021-06-03 DIAGNOSIS — M79602 Pain in left arm: Secondary | ICD-10-CM | POA: Diagnosis not present

## 2021-06-03 DIAGNOSIS — E782 Mixed hyperlipidemia: Secondary | ICD-10-CM

## 2021-06-03 DIAGNOSIS — R002 Palpitations: Secondary | ICD-10-CM | POA: Diagnosis not present

## 2021-06-03 DIAGNOSIS — Z8249 Family history of ischemic heart disease and other diseases of the circulatory system: Secondary | ICD-10-CM | POA: Diagnosis not present

## 2021-06-03 NOTE — Patient Instructions (Addendum)
Medication Instructions:  Your physician recommends that you continue on your current medications as directed. Please refer to the Current Medication list given to you today.  *If you need a refill on your cardiac medications before your next appointment, please call your pharmacy*   Lab Work: None ordered  If you have labs (blood work) drawn today and your tests are completely normal, you will receive your results only by: [place] (if you have MyChart) OR A paper copy in the mail If you have any lab test that is abnormal or we need to change your treatment, we will call you to review the results.   Testing/Procedures: Your physician has requested that you have an exercise tolerance test. For further information please visit [email or web address]. Please also follow instruction sheet, BELOW:  - DO NOT TAKE YOUR METOPROLOL MEDICATION THE MORNING OF THE STRESS TEST.  BRING IT WITH YOU TO YOUR APPOINTMENT.  - DO NOT EAT, DRINK, OR USE TOBACCO PRODUCTS WITHIN 4 HOURS OF THE TEST - DRESS PREPARED TO EXERCISE IN A COMFORTABLE, 2 PIECE CLOTHING OUTFIT AND WALKING SHOES - BRING ANY PRESCRIPTION MEDICATIONS WITH YOU  - NOTIFY THE OFFICE 24 HOURS IN ADVANCE IF YOU NEED TO CANCEL - CALL THE OFFICE [phone] IF YOU HAVE ANY QUESTIONS   Follow-Up: At [facility], you and your health needs are our priority.  As part of our continuing mission to provide you with exceptional heart care, we have created designated Provider Care Teams.  These Care Teams include your primary Cardiologist (physician) and Advanced Practice Providers (APPs -  Physician Assistants and Nurse Practitioners) who all work together to provide you with the care you need, when you need it.  We recommend signing up for the patient portal called "MyChart".  Sign up information is provided on this After Visit Summary.  MyChart is used to connect with patients for Virtual Visits (Telemedicine).  Patients are able to view  lab/test results, encounter notes, upcoming appointments, etc.  Non-urgent messages can be sent to your provider as well.   To learn more about what you can do with MyChart, go to [email or web address].    Your next appointment:   6 month(s)  The format for your next appointment:   In Person  Provider:   You may see None or one of the following Advanced Practice Providers on your designated Care Team:   Pinn Dopp, PA-C Robbie Lis, [place]   Other Instructions

## 2021-06-10 ENCOUNTER — Ambulatory Visit (INDEPENDENT_AMBULATORY_CARE_PROVIDER_SITE_OTHER): Payer: 59

## 2021-06-10 ENCOUNTER — Other Ambulatory Visit: Payer: Self-pay

## 2021-06-10 DIAGNOSIS — E782 Mixed hyperlipidemia: Secondary | ICD-10-CM | POA: Diagnosis not present

## 2021-06-10 DIAGNOSIS — M79602 Pain in left arm: Secondary | ICD-10-CM | POA: Diagnosis not present

## 2021-06-10 DIAGNOSIS — I10 Essential (primary) hypertension: Secondary | ICD-10-CM

## 2021-06-10 DIAGNOSIS — R002 Palpitations: Secondary | ICD-10-CM | POA: Diagnosis not present

## 2021-06-10 DIAGNOSIS — Z8249 Family history of ischemic heart disease and other diseases of the circulatory system: Secondary | ICD-10-CM | POA: Diagnosis not present

## 2021-06-10 LAB — EXERCISE TOLERANCE TEST
Angina Index: 0
Base ST Depression (mm): 0 mm
Duke Treadmill Score: 10
Estimated workload: 10.5
Exercise duration (min): 10 min
Exercise duration (sec): 0 s
MPHR: 165 {beats}/min
Peak HR: 142 {beats}/min
Percent HR: 86 %
RPE: 15
Rest HR: 48 {beats}/min
ST Depression (mm): 0 mm

## 2021-06-11 ENCOUNTER — Other Ambulatory Visit: Payer: Self-pay

## 2021-06-24 ENCOUNTER — Ambulatory Visit (AMBULATORY_SURGERY_CENTER): Payer: 59 | Admitting: *Deleted

## 2021-06-24 ENCOUNTER — Other Ambulatory Visit: Payer: Self-pay

## 2021-06-24 ENCOUNTER — Encounter: Payer: Self-pay | Admitting: Gastroenterology

## 2021-06-24 VITALS — Ht 67.0 in | Wt 161.0 lb

## 2021-06-24 DIAGNOSIS — Z86010 Personal history of colonic polyps: Secondary | ICD-10-CM

## 2021-06-24 MED ORDER — NA SULFATE-K SULFATE-MG SULF 17.5-3.13-1.6 GM/177ML PO SOLN
1.0000 | Freq: Once | ORAL | 0 refills | Status: AC
Start: 2021-06-24 — End: 2021-06-28
  Filled 2021-06-24: qty 354, 1d supply, fill #0

## 2021-06-24 NOTE — Progress Notes (Signed)
Pt verified name, DOB, address and insurance during PV today.  Pt mailed instruction packet of, copy of consent form to read and not return, and instructions. PV completed over the phone With wife today  Pt encouraged to call with questions or issues.  My Chart instructions to pt as well    No egg or soy allergy known to patient  No issues known to pt with past sedation with any surgeries or procedures Patient denies  intubation  No FH of Malignant Hyperthermia Pt is not on diet pills Pt is not on  home 02  Pt is not on blood thinners  Pt denies issues with constipation  No A fib or A flutter  Pt is fully vaccinated  for Covid   Due to the COVID-19 pandemic we are asking patients to follow certain guidelines in PV and the [place]   Pt aware of COVID protocols and [facility] guidelines

## 2021-06-26 ENCOUNTER — Telehealth: Payer: Self-pay

## 2021-06-26 MED ORDER — COLCHICINE 0.6 MG PO TABS
ORAL_TABLET | ORAL | 2 refills | Status: DC
  Filled 2021-06-26: qty 12, 4d supply, fill #0
  Filled 2021-07-03: qty 12, 4d supply, fill #1
  Filled 2021-07-17: qty 12, 4d supply, fill #2

## 2021-06-26 NOTE — Telephone Encounter (Signed)
Dr Carlota Raspberry can you specify quantity and refills for patient. Thank you!

## 2021-06-26 NOTE — Telephone Encounter (Signed)
Caller name:Marion Francisca Harbuck callback [phone]  Encourage patient to contact the pharmacy for refills or they can request refills through [facility]  (Please schedule appointment if patient has not been seen in over a year)  MEDICATION NAME & DOSE:colchicine 0.6 MG tablet  Notes/Comments from patient:  [place]: [place]  Please notify patient: It takes 48-72 hours to process rx refill requests Ask patient to call pharmacy to ensure rx is ready before heading there.   (CLINICAL TO FILL OR ROUTE PER PROTOCOLS)

## 2021-06-26 NOTE — Telephone Encounter (Signed)
Done

## 2021-06-27 ENCOUNTER — Other Ambulatory Visit: Payer: Self-pay

## 2021-07-03 ENCOUNTER — Other Ambulatory Visit: Payer: Self-pay

## 2021-07-10 ENCOUNTER — Other Ambulatory Visit: Payer: Self-pay

## 2021-07-10 ENCOUNTER — Ambulatory Visit (AMBULATORY_SURGERY_CENTER): Payer: 59 | Admitting: Gastroenterology

## 2021-07-10 ENCOUNTER — Encounter: Payer: Self-pay | Admitting: Gastroenterology

## 2021-07-10 VITALS — BP 100/64 | HR 64 | Temp 98.6°F | Resp 15 | Ht 67.0 in | Wt 150.0 lb

## 2021-07-10 DIAGNOSIS — N182 Chronic kidney disease, stage 2 (mild): Secondary | ICD-10-CM | POA: Diagnosis not present

## 2021-07-10 DIAGNOSIS — I129 Hypertensive chronic kidney disease with stage 1 through stage 4 chronic kidney disease, or unspecified chronic kidney disease: Secondary | ICD-10-CM | POA: Diagnosis not present

## 2021-07-10 DIAGNOSIS — Z1211 Encounter for screening for malignant neoplasm of colon: Secondary | ICD-10-CM | POA: Diagnosis not present

## 2021-07-10 DIAGNOSIS — Z86010 Personal history of colonic polyps: Secondary | ICD-10-CM

## 2021-07-10 MED ORDER — SODIUM CHLORIDE 0.9 % IV SOLN
500.0000 mL | Freq: Once | INTRAVENOUS | Status: DC
Start: 2021-07-10 — End: 2021-07-10

## 2021-07-10 NOTE — Progress Notes (Signed)
History and Physical:  This patient presents for endoscopic testing for: Encounter Diagnosis  Name Primary?   Personal history of colonic polyps Yes    Polyps 2021 with fair prep Patient denies chronic abdominal pain, rectal bleeding, constipation or diarrhea.   ROS: Patient denies chest pain or cough   Past Medical History: Past Medical History:  Diagnosis Date   Anxiety    CKD (chronic kidney disease), stage II    Hyperlipidemia    past hx- controlled   Hypertension    Palpitations      Past Surgical History: Past Surgical History:  Procedure Laterality Date   COLONOSCOPY     MOUTH SURGERY     removed teeth   POLYPECTOMY      Allergies: No Known Allergies  Outpatient Meds: Current Outpatient Medications  Medication Sig Dispense Refill   amLODipine (NORVASC) 10 MG tablet Take 1 tablet (10 mg total) by mouth daily. 90 tablet 1   colchicine 0.6 MG tablet 1.2mg  once, then additional 0.6mg  1 hour later if needed per attack. 12 tablet 2   metoprolol tartrate (LOPRESSOR) 25 MG tablet Take 1 tablet (25 mg total) by mouth 2 (two) times daily as needed (palpitations). (Patient taking differently: Take 25 mg by mouth 2 (two) times daily.) 60 tablet 3   Current Facility-Administered Medications  Medication Dose Route Frequency Provider Last Rate Last Admin   0.9 %  sodium chloride infusion  500 mL Intravenous Once Doran Stabler, MD          ___________________________________________________________________ Objective   Exam:  BP 122/85   Pulse (!) 57   Temp 98.6 F (37 C)   Ht 5\' 7"  (1.702 m)   Wt 150 lb (68 kg)   SpO2 99%   BMI 23.49 kg/m   CV: RRR without murmur, S1/S2 Resp: clear to auscultation bilaterally, normal RR and effort noted GI: soft, no tenderness, with active bowel sounds.   Assessment: Encounter Diagnosis  Name Primary?   Personal history of colonic polyps Yes     Plan: Colonoscopy  The benefits and risks of the planned  procedure were described in detail with the patient or (when appropriate) their health care proxy.  Risks were outlined as including, but not limited to, bleeding, infection, perforation, adverse medication reaction leading to cardiac or pulmonary decompensation, pancreatitis (if ERCP).  The limitation of incomplete mucosal visualization was also discussed.  No guarantees or warranties were given.    The patient is appropriate for an endoscopic procedure in the ambulatory setting.   - Wilfrid Lund, MD

## 2021-07-10 NOTE — Op Note (Addendum)
[place] Patient Name: Travis Huynh Procedure Date: 07/10/2021 8:57 AM MRN: [id number] Endoscopist: Mallie Mussel L. Loletha Carrow , MD Age: 55 Referring MD:  Date of Birth: 1966-08-02 Gender: Male Account #: [id number] Procedure:                Colonoscopy Indications:              Surveillance: History of adenomatous polyps,                            inadequate prep on last exam (<37yr)                           AO SSP and AC TA (both < 49mm) 04/2020 with fair                            prep (miralax) Medicines:                Monitored Anesthesia Care Procedure:                Pre-Anesthesia Assessment:                           - Prior to the procedure, a History and Physical                            was performed, and patient medications and                            allergies were reviewed. The patient's tolerance of                            previous anesthesia was also reviewed. The risks                            and benefits of the procedure and the sedation                            options and risks were discussed with the patient.                            All questions were answered, and informed consent                            was obtained. Prior Anticoagulants: The patient has                            taken no previous anticoagulant or antiplatelet                            agents. ASA Grade Assessment: II - A patient with                            mild systemic disease. After reviewing the risks  and benefits, the patient was deemed in                            satisfactory condition to undergo the procedure.                           After obtaining informed consent, the colonoscope                            was passed under direct vision. Throughout the                            procedure, the patient's blood pressure, pulse, and                            oxygen saturations were monitored continuously. The                             CF [id number] #[id number] was introduced through the anus                            and advanced to the the cecum, identified by                            appendiceal orifice and ileocecal valve. The                            colonoscopy was performed without difficulty. The                            patient tolerated the procedure well. The quality                            of the bowel preparation was excellent. The                            ileocecal valve, appendiceal orifice, and rectum                            were photographed. The bowel preparation used was                            SUPREP. Scope In: 9:05:53 AM Scope Out: 9:16:21 AM Scope Withdrawal Time: 0 hours 8 minutes 1 second  Total Procedure Duration: 0 hours 10 minutes 28 seconds  Findings:                 The perianal and digital rectal examinations were                            normal.                           A few small-mouthed diverticula were found in the  right colon.                           The exam was otherwise without abnormality on                            direct and retroflexion views. Complications:            No immediate complications. Estimated Blood Loss:     Estimated blood loss: none. Impression:               - Diverticulosis in the right colon.                           - The examination was otherwise normal on direct                            and retroflexion views.                           - No specimens collected. Recommendation:           - Patient has a contact number available for                            emergencies. The signs and symptoms of potential                            delayed complications were discussed with the                            patient. Return to normal activities tomorrow.                            Written discharge instructions were provided to the                            patient.                           -  Resume previous diet.                           - Continue present medications.                           - Repeat colonoscopy in 7 years for screening                            purposes. Gerianne Simonet L. Loletha Carrow, MD 07/10/2021 9:23:04 AM This report has been signed electronically.

## 2021-07-10 NOTE — Progress Notes (Signed)
Report given to PACU, vss 

## 2021-07-10 NOTE — Patient Instructions (Signed)
Handout on diverticulosis given.   YOU HAD AN ENDOSCOPIC PROCEDURE TODAY AT THE [facility]:   Refer to the procedure report that was given to you for any specific questions about what was found during the examination.  If the procedure report does not answer your questions, please call your gastroenterologist to clarify.  If you requested that your care partner not be given the details of your procedure findings, then the procedure report has been included in a sealed envelope for you to review at your convenience later.  YOU SHOULD EXPECT: Some feelings of bloating in the abdomen. Passage of more gas than usual.  Walking can help get rid of the air that was put into your GI tract during the procedure and reduce the bloating. If you had a lower endoscopy (such as a colonoscopy or flexible sigmoidoscopy) you may notice spotting of blood in your stool or on the toilet paper. If you underwent a bowel prep for your procedure, you may not have a normal bowel movement for a few days.  Please Note:  You might notice some irritation and congestion in your nose or some drainage.  This is from the oxygen used during your procedure.  There is no need for concern and it should clear up in a day or so.  SYMPTOMS TO REPORT IMMEDIATELY:   Following lower endoscopy (colonoscopy or flexible sigmoidoscopy):  Excessive amounts of blood in the stool  Significant tenderness or worsening of abdominal pains  Swelling of the abdomen that is new, acute  Fever of 100F or higher   For urgent or emergent issues, a gastroenterologist can be reached at any hour by calling [phone]. Do not use MyChart messaging for urgent concerns.    DIET:  We do recommend a small meal at first, but then you may proceed to your regular diet.  Drink plenty of fluids but you should avoid alcoholic beverages for 24 hours.  ACTIVITY:  You should plan to take it easy for the rest of today and you should NOT DRIVE or use  heavy machinery until tomorrow (because of the sedation medicines used during the test).    FOLLOW UP: Our staff will call the number listed on your records 48-72 hours following your procedure to check on you and address any questions or concerns that you may have regarding the information given to you following your procedure. If we do not reach you, we will leave a message.  We will attempt to reach you two times.  During this call, we will ask if you have developed any symptoms of COVID 19. If you develop any symptoms (ie: fever, flu-like symptoms, shortness of breath, cough etc.) before then, please call [phone].  If you test positive for Covid 19 in the 2 weeks post procedure, please call and report this information to us.    If any biopsies were taken you will be contacted by phone or by letter within the next 1-3 weeks.  Please call us at [phone] if you have not heard about the biopsies in 3 weeks.    SIGNATURES/CONFIDENTIALITY: You and/or your care partner have signed paperwork which will be entered into your electronic medical record.  These signatures attest to the fact that that the information above on your After Visit Summary has been reviewed and is understood.  Full responsibility of the confidentiality of this discharge information lies with you and/or your care-partner. 

## 2021-07-10 NOTE — Progress Notes (Signed)
Pt's states no medical or surgical changes since previsit or office visit.   VS taken by CW 

## 2021-07-14 ENCOUNTER — Telehealth: Payer: Self-pay

## 2021-07-14 NOTE — Telephone Encounter (Signed)
First post procedure follow up call, no answer 

## 2021-07-14 NOTE — Telephone Encounter (Signed)
Second attempt follow up call to pt, no answer.  

## 2021-07-15 ENCOUNTER — Other Ambulatory Visit: Payer: Self-pay

## 2021-07-17 ENCOUNTER — Other Ambulatory Visit: Payer: Self-pay

## 2021-08-06 ENCOUNTER — Other Ambulatory Visit: Payer: Self-pay

## 2021-08-06 ENCOUNTER — Encounter: Payer: Self-pay | Admitting: Family Medicine

## 2021-08-06 ENCOUNTER — Ambulatory Visit: Payer: 59 | Admitting: Family Medicine

## 2021-08-06 DIAGNOSIS — R7303 Prediabetes: Secondary | ICD-10-CM | POA: Diagnosis not present

## 2021-08-06 DIAGNOSIS — I10 Essential (primary) hypertension: Secondary | ICD-10-CM | POA: Diagnosis not present

## 2021-08-06 DIAGNOSIS — H9190 Unspecified hearing loss, unspecified ear: Secondary | ICD-10-CM

## 2021-08-06 DIAGNOSIS — E785 Hyperlipidemia, unspecified: Secondary | ICD-10-CM | POA: Diagnosis not present

## 2021-08-06 DIAGNOSIS — Z13220 Encounter for screening for lipoid disorders: Secondary | ICD-10-CM

## 2021-08-06 LAB — COMPREHENSIVE METABOLIC PANEL
ALT: 19 U/L (ref 0–53)
AST: 14 U/L (ref 0–37)
Albumin: 4 g/dL (ref 3.5–5.2)
Alkaline Phosphatase: 61 U/L (ref 39–117)
BUN: 10 mg/dL (ref 6–23)
CO2: 26 mEq/L (ref 19–32)
Calcium: 9.5 mg/dL (ref 8.4–10.5)
Chloride: 107 mEq/L (ref 96–112)
Creatinine, Ser: 0.98 mg/dL (ref 0.40–1.50)
GFR: 86.48 mL/min (ref >60.00)
Glucose, Bld: 85 mg/dL (ref 70–99)
Potassium: 4 mEq/L (ref 3.5–5.1)
Sodium: 140 mEq/L (ref 135–145)
Total Bilirubin: 0.3 mg/dL (ref 0.2–1.2)
Total Protein: 7.2 g/dL (ref 6.0–8.3)

## 2021-08-06 LAB — HEMOGLOBIN A1C: Hgb A1c MFr Bld: 6.3 % (ref 4.6–6.5)

## 2021-08-06 LAB — LIPID PANEL
Cholesterol: 218 mg/dL — ABNORMAL HIGH (ref 0–200)
HDL: 42.9 mg/dL (ref >39.00)
LDL Cholesterol: 156 mg/dL — ABNORMAL HIGH (ref 0–99)
NonHDL: 175.26
Total CHOL/HDL Ratio: 5
Triglycerides: 98 mg/dL (ref 0.0–149.0)
VLDL: 19.6 mg/dL (ref 0.0–40.0)

## 2021-08-06 MED ORDER — METOPROLOL TARTRATE 25 MG PO TABS
25.0000 mg | ORAL_TABLET | Freq: Two times a day (BID) | ORAL | 1 refills | Status: DC
  Filled 2021-08-06: qty 180, 90d supply, fill #0
  Filled 2021-12-08: qty 180, 90d supply, fill #1

## 2021-08-06 MED ORDER — AMLODIPINE BESYLATE 10 MG PO TABS
10.0000 mg | ORAL_TABLET | Freq: Every day | ORAL | 1 refills | Status: DC
  Filled 2021-08-06: qty 90, 90d supply, fill #0
  Filled 2021-11-28: qty 90, 90d supply, fill #1

## 2021-08-06 NOTE — Patient Instructions (Signed)
No new medications at this time.  I will refer you for hearing testing.  If any concerns on labs I will let you know.  Thanks for coming in today.

## 2021-08-06 NOTE — Progress Notes (Signed)
Subjective:  Patient ID: Travis Huynh, male    DOB: [birth year]  Age: 55 y.o. MRN: [id number]  CC:  Chief Complaint  Patient presents with   Hypertension    Pt here for recheck on bp no concerns reports no physical sxs.    Prediabetes    Pt here for recheck on levels notes he was unaware of prediabetic rnge and has not made any changes at this time, also in need of recheck cholesterol   Cerumen Impaction    Pt wife reports hard of hearing would like ears checked for wax or other cause.     HPI Travis Huynh presents for   Hypertension: With CKD.  AKI in July.  Previous HCTZ was held during that admission.  Continued on amlodipine 10 mg and metoprolol 25 mg twice daily for hypertension.  Continued  to hold HCTZ, including with history of gout. No nsaids, tylenol on occasion. Drinking fluids.  Home readings: 122/70, no new side effects.  BP Readings from Last 3 Encounters:  08/06/21 122/70  07/10/21 100/64  06/03/21 100/70   Lab Results  Component Value Date   CREATININE 1.22 03/19/2021   Hyperlipidemia: No current meds.  Diet/exercise approach. Exercise: only at work - standing, active with work.  Fast food/takeout food - rare.  Fasting today.  Lab Results  Component Value Date   CHOL 206 (H) 01/27/2021   HDL 45.80 01/27/2021   LDLCALC 123 (H) 01/27/2021   TRIG 184.0 (H) 01/27/2021   CHOLHDL 4 01/27/2021   Lab Results  Component Value Date   ALT 20 03/10/2021   AST 22 03/10/2021   ALKPHOS 56 03/10/2021   BILITOT 0.7 03/10/2021   Prediabetes: With A1c near diabetic range in May.  Weight has increased 4 pounds since August visit.  5 pounds total since May.  No change in thirst/urination, no blurry vision.  Lab Results  Component Value Date   HGBA1C 6.5 01/27/2021   Wt Readings from Last 3 Encounters:  08/06/21 162 lb 12.8 oz (73.8 kg)  07/10/21 150 lb (68 kg)  06/24/21 161 lb (73 kg)   Hearing difficulty: Some concerns at home and work about his  hearing. Having to repeat self at times. He feels like mask  TV volume is higher.  No military experience, firearms or noise exposure in past.  History Patient Active Problem List   Diagnosis Date Noted   Left knee pain 03/10/2021   Hypertension    Tobacco abuse    Acute renal failure superimposed on stage 2 chronic kidney disease (HCC)    Past Medical History:  Diagnosis Date   Anxiety    CKD (chronic kidney disease), stage II    Hyperlipidemia    past hx- controlled   Hypertension    Palpitations    Past Surgical History:  Procedure Laterality Date   COLONOSCOPY     MOUTH SURGERY     removed teeth   POLYPECTOMY     No Known Allergies Prior to Admission medications   Medication Sig Start Date End Date Taking? Authorizing Provider  colchicine 0.6 MG tablet 1.2mg  once, then additional 0.6mg  1 hour later if needed per attack. 06/26/21  Yes Wendie Agreste, MD  metoprolol tartrate (LOPRESSOR) 25 MG tablet Take 1 tablet (25 mg total) by mouth 2 (two) times daily as needed (palpitations). Patient taking differently: Take 25 mg by mouth 2 (two) times daily. 05/01/21  Yes Wendie Agreste, MD  amLODipine (NORVASC) 10 MG tablet Take 1  tablet (10 mg total) by mouth daily. 05/01/21 08/03/21  Wendie Agreste, MD   Social History   Socioeconomic History   Marital status: Married    Spouse name: Not on file   Number of children: Not on file   Years of education: Not on file   Highest education level: Not on file  Occupational History   Not on file  Tobacco Use   Smoking status: Every Day    Packs/day: 0.25    Types: Cigarettes   Smokeless tobacco: Never   Tobacco comments:    pt smokes about 2 cigarettes a day   Vaping Use   Vaping Use: Never used  Substance and Sexual Activity   Alcohol use: Yes    Alcohol/week: 1.0 standard drink    Types: 1 Cans of beer per week    Comment: occasionally   Drug use: Never   Sexual activity: Yes  Other Topics Concern   Not on  file  Social History Narrative   Not on file   Social Determinants of Health   Financial Resource Strain: Not on file  Food Insecurity: Not on file  Transportation Needs: Not on file  Physical Activity: Not on file  Stress: Not on file  Social Connections: Not on file  Intimate Partner Violence: Not on file    Review of Systems  Constitutional:  Negative for fatigue and unexpected weight change.  Eyes:  Negative for visual disturbance.  Respiratory:  Negative for cough, chest tightness and shortness of breath.   Cardiovascular:  Negative for chest pain, palpitations and leg swelling.  Gastrointestinal:  Negative for abdominal pain and blood in stool.  Neurological:  Negative for dizziness, light-headedness and headaches.    Objective:   Vitals:   08/06/21 0852  BP: 122/70  Pulse: (!) 50  Resp: 16  Temp: 97.9 F (36.6 C)  TempSrc: Temporal  SpO2: 98%  Weight: 162 lb 12.8 oz (73.8 kg)  Height: 5\' 7"  (1.702 m)     Physical Exam Vitals reviewed.  Constitutional:      Appearance: He is well-developed.  HENT:     Head: Normocephalic and atraumatic.     Right Ear: Tympanic membrane, ear canal and external ear normal. There is no impacted cerumen.     Left Ear: Tympanic membrane, ear canal and external ear normal. There is no impacted cerumen.  Neck:     Vascular: No carotid bruit or JVD.  Cardiovascular:     Rate and Rhythm: Normal rate and regular rhythm.     Heart sounds: Normal heart sounds. No murmur heard. Pulmonary:     Effort: Pulmonary effort is normal.     Breath sounds: Normal breath sounds. No rales.  Musculoskeletal:     Right lower leg: No edema.     Left lower leg: No edema.  Skin:    General: Skin is warm and dry.  Neurological:     Mental Status: He is alert and oriented to person, place, and time.  Psychiatric:        Mood and Affect: Mood normal.       Assessment & Plan:  Travis Huynh is a 55 y.o. male . Hyperlipidemia, unspecified  hyperlipidemia type - Plan: Comprehensive metabolic panel, Hemoglobin A1c  -Check lipids, consider low-dose statin depending on levels.  Prediabetes - Plan: Comprehensive metabolic panel, Lipid panel, Hemoglobin A1c  -Check A1c is borderline diabetic previously.  Continue to avoid fast food, restaurant/takeout food.  May need to look at  diet further depending on the lab result.  Hearing loss, unspecified hearing loss type, unspecified laterality - Plan: Ambulatory referral to Audiology  -Refer for hearing testing.  Ear exam in office reassuring.  Essential hypertension  -Stable control, continue same regimen.  No orders of the defined types were placed in this encounter.  Patient Instructions  No new medications at this time.  I will refer you for hearing testing.  If any concerns on labs I will let you know.  Thanks for coming in today.    Signed,   Merri Ray, MD [place], [facility] 08/06/21 9:26 AM

## 2021-08-08 ENCOUNTER — Other Ambulatory Visit: Payer: Self-pay

## 2021-08-25 IMAGING — MR MR KNEE*L* W/O CM
7 series · 40 of 40 positions shown · non-contrast
Comparison: None.

CLINICAL DATA: Knee pain and swelling

EXAM:
MRI OF THE LEFT KNEE WITHOUT CONTRAST
TECHNIQUE: Multiplanar, multisequence MR imaging of the knee was performed. No
intravenous contrast was administered.

[Series 15: T2 fat-sat · axial · left · 4.0mm · 0.50mm/px · z∈[-96,+38]mm · 6 of 28 slices shown (1 of 3)]
[im 1/28]
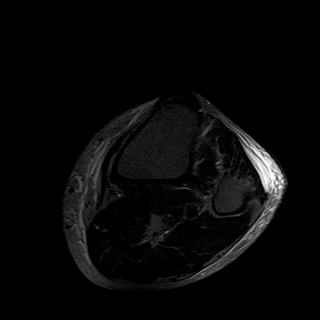
[im 6/28]
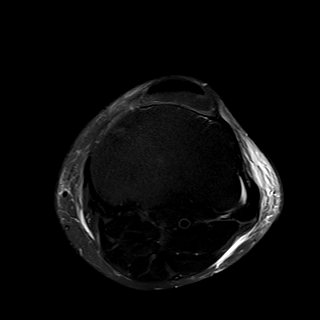
[im 11/28]
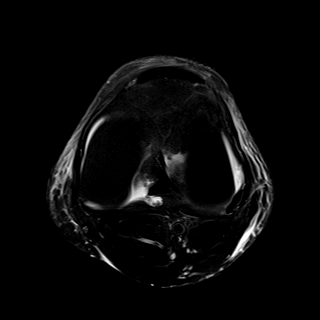
[im 17/28]
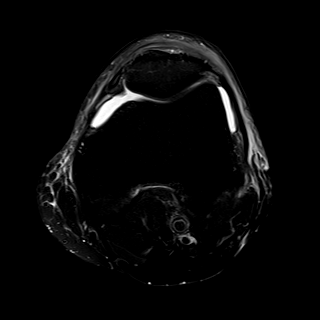
[im 22/28]
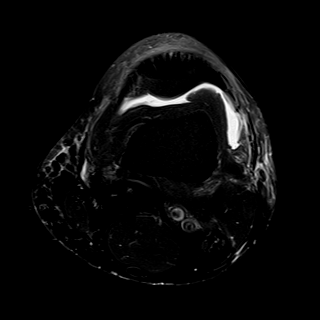
[im 28/28]
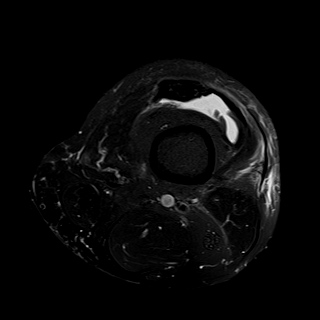

[Series 16: T2 fat-sat · coronal · left · 4.0mm · 0.59mm/px · 5 of 27 slices shown (2 of 3)]
[im 1/27]
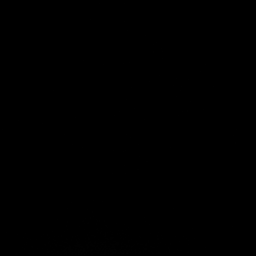
[im 7/27]
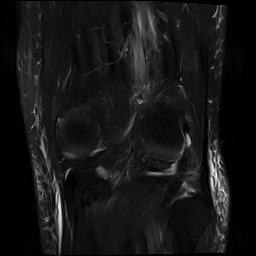
[im 14/27]
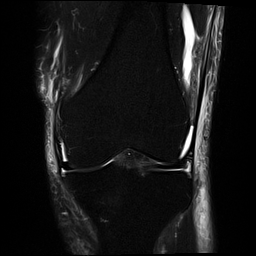
[im 20/27]
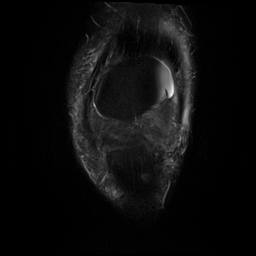
[im 27/27]
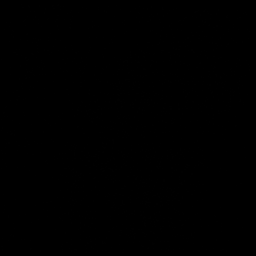

[Series 17: T1 · coronal · left · 4.0mm · 0.59mm/px · 6 of 30 slices shown]
[im 1/30]
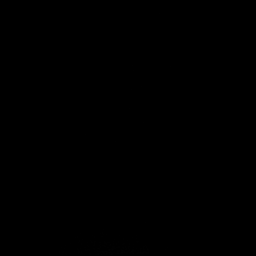
[im 6/30]
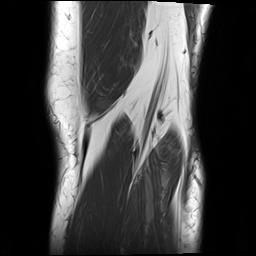
[im 12/30]
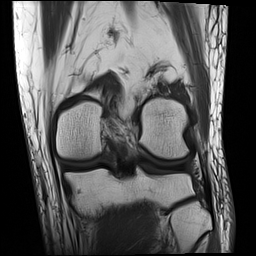
[im 18/30]
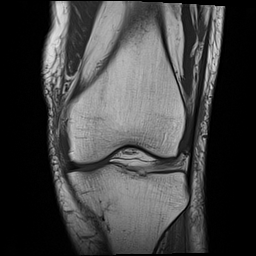
[im 24/30]
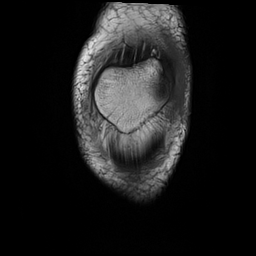
[im 30/30]
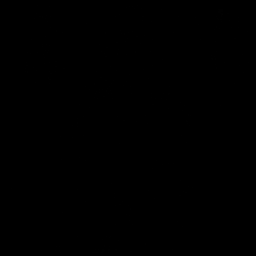

[Series 18: PD fat-sat · coronal · left · 4.0mm · 0.59mm/px · 6 of 30 slices shown (1 of 2)]
[im 1/30]
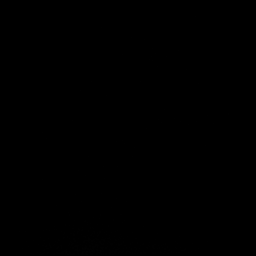
[im 6/30]
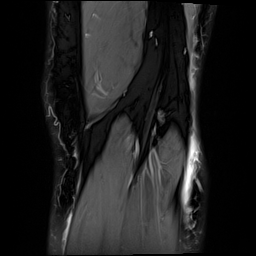
[im 12/30]
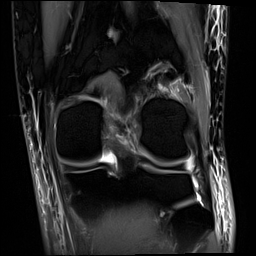
[im 18/30]
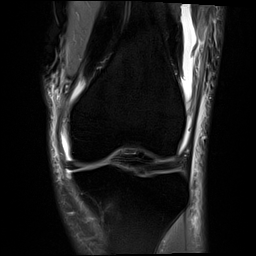
[im 24/30]
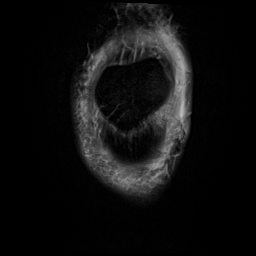
[im 30/30]
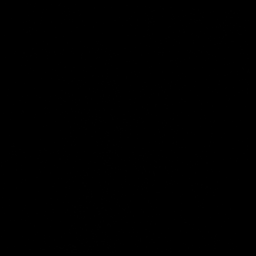

[Series 19: PD fat-sat · sagittal · left · 3.0mm · 0.59mm/px · 7 of 35 slices shown (2 of 2)]
[im 1/35]
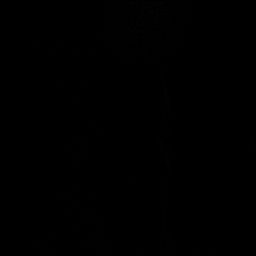
[im 6/35]
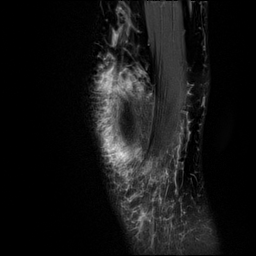
[im 12/35]
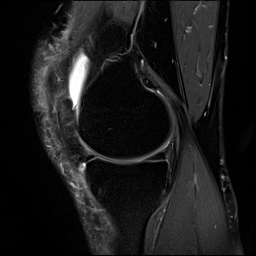
[im 18/35]
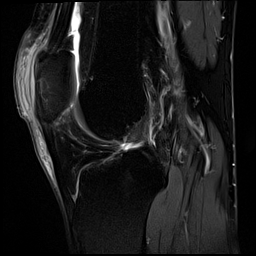
[im 23/35]
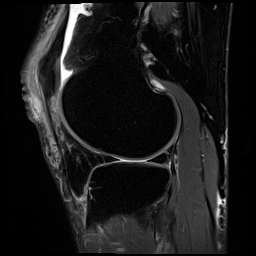
[im 29/35]
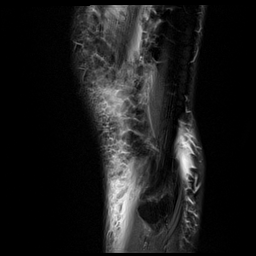
[im 35/35]
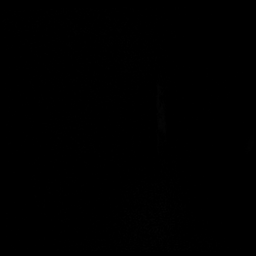

[Series 20: T2 fat-sat · sagittal · left · 3.0mm · 0.59mm/px · 7 of 36 slices shown (3 of 3)]
[im 1/36]
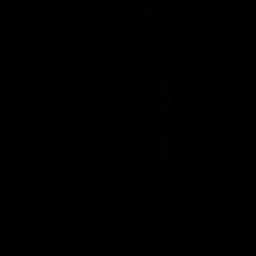
[im 6/36]
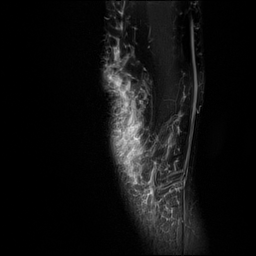
[im 12/36]
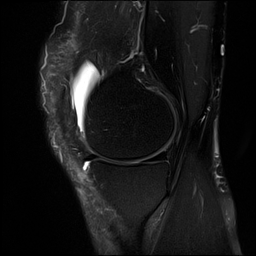
[im 18/36]
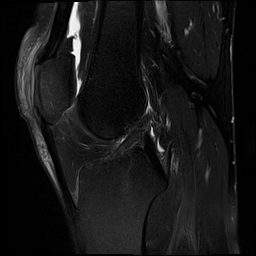
[im 24/36]
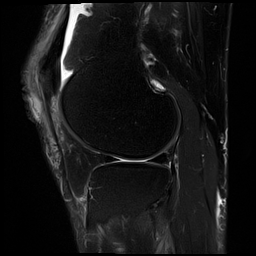
[im 30/36]
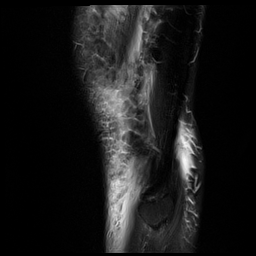
[im 36/36]
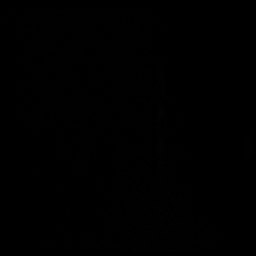

[Series 21: PD · oblique · left · 2.0mm · 0.47mm/px · 3 of 16 slices shown]
[im 1/16]
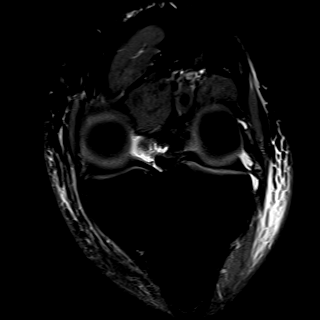
[im 8/16]
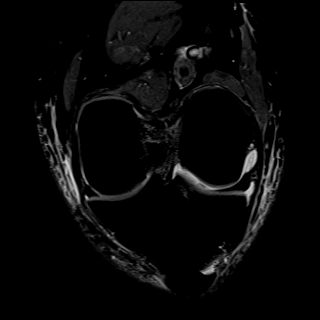
[im 16/16]
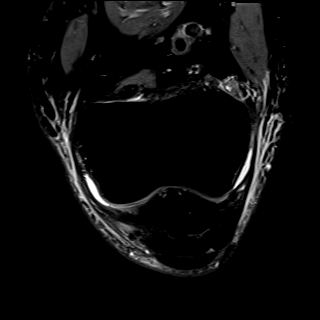

[40 of 40 positions shown; findings below may reference images not displayed]

FINDINGS: MENISCI

Medial meniscus:  Unremarkable

Lateral meniscus:  Unremarkable

LIGAMENTS

Cruciates:  Unremarkable

Collaterals:  Unremarkable

CARTILAGE

Patellofemoral:  Unremarkable

Medial:  Unremarkable

Lateral:  Unremarkable

Joint:  Small but abnormal knee effusion.

Popliteal Fossa:  Unremarkable

Extensor Mechanism:  Unremarkable

Bones: No significant extra-articular osseous abnormalities
identified.

Other: Substantial prepatellar edema with suspicion for mild
prepatellar bursitis for example on image 19 series 19.
IMPRESSION: 1. Mild prepatellar bursitis with surrounding prepatellar
infiltrative edema.
2. Small but abnormal knee effusion.

## 2021-09-04 ENCOUNTER — Other Ambulatory Visit: Payer: Self-pay

## 2021-09-04 ENCOUNTER — Telehealth: Payer: Self-pay

## 2021-09-04 DIAGNOSIS — M109 Gout, unspecified: Secondary | ICD-10-CM

## 2021-09-04 MED ORDER — COLCHICINE 0.6 MG PO TABS
ORAL_TABLET | ORAL | 2 refills | Status: DC
  Filled 2021-09-04: qty 12, 4d supply, fill #0
  Filled 2021-09-12: qty 12, 4d supply, fill #1
  Filled 2021-10-21: qty 12, 4d supply, fill #2

## 2021-09-04 NOTE — Telephone Encounter (Signed)
Caller name:Kaye Emmit [place] callback [phone]  Encourage patient to contact the pharmacy for refills or they can request refills through [facility]  (Please schedule appointment if patient has not been seen in over a year)  MEDICATION NAME & DOSE:colchicine 0.6 MG tablet   Notes/Comments from patient:  [place] TO: [place]   Please notify patient: It takes 48-72 hours to process rx refill requests Ask patient to call pharmacy to ensure rx is ready before heading there.   (CLINICAL TO FILL OR ROUTE PER PROTOCOLS)

## 2021-09-04 NOTE — Telephone Encounter (Signed)
Patient medication has been sent to the pharmacy ?

## 2021-09-10 ENCOUNTER — Emergency Department
Admission: EM | Admit: 2021-09-10 | Discharge: 2021-09-10 | Disposition: A | Discharge status: Home / Self Care | Payer: 59 | Attending: Emergency Medicine | Admitting: Emergency Medicine

## 2021-09-10 ENCOUNTER — Other Ambulatory Visit: Payer: Self-pay

## 2021-09-10 DIAGNOSIS — I129 Hypertensive chronic kidney disease with stage 1 through stage 4 chronic kidney disease, or unspecified chronic kidney disease: Secondary | ICD-10-CM | POA: Insufficient documentation

## 2021-09-10 DIAGNOSIS — R001 Bradycardia, unspecified: Secondary | ICD-10-CM | POA: Insufficient documentation

## 2021-09-10 DIAGNOSIS — Z87891 Personal history of nicotine dependence: Secondary | ICD-10-CM | POA: Insufficient documentation

## 2021-09-10 DIAGNOSIS — R040 Epistaxis: Secondary | ICD-10-CM | POA: Diagnosis not present

## 2021-09-10 DIAGNOSIS — M10061 Idiopathic gout, right knee: Secondary | ICD-10-CM | POA: Insufficient documentation

## 2021-09-10 DIAGNOSIS — M109 Gout, unspecified: Secondary | ICD-10-CM

## 2021-09-10 DIAGNOSIS — N182 Chronic kidney disease, stage 2 (mild): Secondary | ICD-10-CM | POA: Insufficient documentation

## 2021-09-10 LAB — BASIC METABOLIC PANEL
Anion gap: 7 (ref 5–15)
BUN: 14 mg/dL (ref 6–20)
CO2: 23 mmol/L (ref 22–32)
Calcium: 9 mg/dL (ref 8.9–10.3)
Chloride: 107 mmol/L (ref 98–111)
Creatinine, Ser: 0.88 mg/dL (ref 0.61–1.24)
GFR, Estimated: >60 mL/min (ref >60)
Glucose, Bld: 111 mg/dL — ABNORMAL HIGH (ref 70–99)
Potassium: 3.7 mmol/L (ref 3.5–5.1)
Sodium: 137 mmol/L (ref 135–145)

## 2021-09-10 LAB — CBC WITH DIFFERENTIAL/PLATELET
Abs Immature Granulocytes: 0 10*3/uL (ref 0.00–0.07)
Basophils Absolute: 0 10*3/uL (ref 0.0–0.1)
Basophils Relative: 1 %
Eosinophils Absolute: 0.1 10*3/uL (ref 0.0–0.5)
Eosinophils Relative: 1 %
HCT: 40.7 % (ref 39.0–52.0)
Hemoglobin: 13.1 g/dL (ref 13.0–17.0)
Immature Granulocytes: 0 %
Lymphocytes Relative: 51 %
Lymphs Abs: 2.1 10*3/uL (ref 0.7–4.0)
MCH: 27.6 pg (ref 26.0–34.0)
MCHC: 32.2 g/dL (ref 30.0–36.0)
MCV: 85.7 fL (ref 80.0–100.0)
Monocytes Absolute: 0.4 10*3/uL (ref 0.1–1.0)
Monocytes Relative: 9 %
Neutro Abs: 1.6 10*3/uL — ABNORMAL LOW (ref 1.7–7.7)
Neutrophils Relative %: 38 %
Platelets: 241 10*3/uL (ref 150–400)
RBC: 4.75 MIL/uL (ref 4.22–5.81)
RDW: 14.9 % (ref 11.5–15.5)
WBC: 4.2 10*3/uL (ref 4.0–10.5)
nRBC: 0 % (ref 0.0–0.2)

## 2021-09-10 LAB — URIC ACID: Uric Acid, Serum: 8.2 mg/dL (ref 3.7–8.6)

## 2021-09-10 LAB — TROPONIN I (HIGH SENSITIVITY): Troponin I (High Sensitivity): 4 ng/L (ref <18)

## 2021-09-10 NOTE — ED Notes (Signed)
Lab contacted to add on labs 

## 2021-09-10 NOTE — ED Notes (Signed)
See triage note, pt reports intermittent nose bleed since christmas, nose started bleeding at work this am which brought him to ER. Also c/o gout to right knee.  Nad noted No bleeding at this time

## 2021-09-10 NOTE — Discharge Instructions (Signed)
Call make an appointment with your primary care provider if any continued problems with your gout.  Continue taking the colchicine that was prescribed for you.  Also add a humidifier to your house especially in the bedroom which may help with your nosebleeds.  Also make an appointment with your cardiologist for reevaluation of your bradycardia.  Your stress test showed similar bradycardia which is a slow heart rate.  Return to the emergency department immediately should you develop any dizziness, chest pain, shortness of breath or passing out.

## 2021-09-10 NOTE — ED Provider Notes (Signed)
[facility] Provider Note    Event Date/Time   First MD Initiated Contact with Patient 09/10/21 1149     (approximate)   History   Gout and Epistaxis   HPI  Travis Huynh is a 56 y.o. male presents to the ED with complaint of nosebleed off and on for over 1 week and continued right knee pain that was diagnosed as gout.  He continues to take colchicine 0.6 mg twice daily.  He denies the use of any blood thinners.  He has a history of gout.  He denies any injury to his nose.  He denies any recent upper respiratory symptoms, fever or chills.  Patient does not have a nosebleed at this time.  Patient has history of gout, hypertension, tobacco use, hyperlipidemia, chronic kidney disease stage II and anxiety.  He rates his pain as 7 out of 10.      Physical Exam   Triage Vital Signs: ED Triage Vitals  Enc Vitals Group     BP 09/10/21 0946 118/86     Pulse Rate 09/10/21 0946 (!) 43     Resp 09/10/21 0946 16     Temp 09/10/21 0946 98.1 F (36.7 C)     Temp Source 09/10/21 0946 Oral     SpO2 09/10/21 0946 99 %     Weight --      Height --      Head Circumference --      Peak Flow --      Pain Score 09/10/21 0933 7     Pain Loc --      Pain Edu? --      Excl. in [place]? --     Most recent vital signs: Vitals:   09/10/21 0946 09/10/21 1148  BP: 118/86 (!) 159/98  Pulse: (!) 43 (!) 46  Resp: 16 14  Temp: 98.1 F (36.7 C) 97.9 F (36.6 C)  SpO2: 99% 100%     General: Awake, no distress.  No active nosebleed, lying supine on stretcher in no acute distress. CV:  Good peripheral perfusion.  Normal rhythm however rate is bradycardic at 46. Resp:  Normal effort.  Lungs are clear bilaterally to auscultation. Abd:  No distention.  Flat, soft, non tender. Other:  Examination of the nose shows no current active bleeding.  No posterior blood noted during examination of posterior pharynx.  Left naris shows some minimal swelling of the turbinates.  No cervical  lymphadenopathy appreciated.  On examination of the right knee there is no erythema or warmth appreciated.  There is no effusion and no crepitus is appreciated with range of motion.  There is diffuse tenderness on palpation of the suprapatella tendon area.  Skin is intact.  No discoloration noted.   ED Results / Procedures / Treatments   Labs (all labs ordered are listed, but only abnormal results are displayed) Labs Reviewed  CBC WITH DIFFERENTIAL/PLATELET - Abnormal; Notable for the following components:      Result Value   Neutro Abs 1.6 (*)    All other components within normal limits  BASIC METABOLIC PANEL - Abnormal; Notable for the following components:   Glucose, Bld 111 (*)    All other components within normal limits  URIC ACID  TROPONIN I (HIGH SENSITIVITY)     EKG  EKG was reviewed by primary doctor in the ED.  Sinus bradycardia. Ventricular rate 44, PR interval 136, QRS duration 78, QT/QTc 476/406    PROCEDURES:  Critical Care  performed: No  Procedures   MEDICATIONS ORDERED IN ED: Medications - No data to display   IMPRESSION / MDM / [place] / ED COURSE  I reviewed the triage vital signs and the nursing notes.                              Differential diagnosis includes, but is not limited to, recurrent nosebleeds, nasal polyps, nasal trauma, hypertension, bradycardia,  56 year old male presents to the ED with complaint of recurrent nosebleeds off and on since Christmas.  Patient states no trauma to his nose.  He denies any shortness of breath, dizziness, sinus infection, fever, chills, nasal congestion.  He is also being seen for continued gouty arthritis in his right knee for which he is taking colchicine twice daily which was prescribed by his PCP recently.  Patient states that his knee is feeling better than it was and that there is no continued warmth or redness.  No recent injury to his right knee.  Currently patient is not experiencing any  nosebleed.  Physical exam did not reveal any current nasal bleeding.  It was noted during his exam that his pulse rate was 43.  Patient does not exercise regimen lately and is not an athlete or runner.  Patient denies any shortness of breath, dizziness, syncopal episodes or chest pain.  In looking through his records from his cardiology his pulse rate generally has been in the 40s and patient states that he has not been symptomatic during these times.  He also had a stress test which shows his pulse rate was in the 40s on that day as well.  Patient was reassured that there is no active bleeding at this time and we discussed the temperature in his home as well as using a humidifier during winter.  He is to follow-up with his cardiologist about his bradycardia which most likely is his normal.  He will continue with colchicine for his acute gouty arthritis and follow-up with his primary care provider.  Patient was ambulatory at the time of discharge without any difficulty.      FINAL CLINICAL IMPRESSION(S) / ED DIAGNOSES   Final diagnoses:  Epistaxis, recurrent  Acute gout of right knee, unspecified cause  Bradycardia     Rx / DC Orders   ED Discharge Orders     None        Note:  This document was prepared using Dragon voice recognition software and may include unintentional dictation errors.   Johnn Hai, PA-C 09/10/21 1512    Lavonia Drafts, MD 09/10/21 1515

## 2021-09-10 NOTE — ED Triage Notes (Signed)
Pt comes with c/o nose bleed on and off since Christmas. Pt also states gout in right knee. Pt does take meds for gout.  No bleeding of nose at this time.

## 2021-09-11 ENCOUNTER — Ambulatory Visit: Payer: 59 | Admitting: Registered Nurse

## 2021-09-12 ENCOUNTER — Other Ambulatory Visit: Payer: Self-pay

## 2021-09-15 ENCOUNTER — Other Ambulatory Visit: Payer: Self-pay

## 2021-10-21 ENCOUNTER — Other Ambulatory Visit: Payer: Self-pay

## 2021-11-04 ENCOUNTER — Emergency Department
Admission: EM | Admit: 2021-11-04 | Discharge: 2021-11-04 | Disposition: A | Discharge status: Home / Self Care | Payer: 59 | Attending: Emergency Medicine | Admitting: Emergency Medicine

## 2021-11-04 ENCOUNTER — Emergency Department: Payer: 59

## 2021-11-04 ENCOUNTER — Telehealth: Payer: Self-pay | Admitting: Family Medicine

## 2021-11-04 ENCOUNTER — Other Ambulatory Visit: Payer: Self-pay

## 2021-11-04 DIAGNOSIS — M5410 Radiculopathy, site unspecified: Secondary | ICD-10-CM | POA: Diagnosis not present

## 2021-11-04 DIAGNOSIS — N189 Chronic kidney disease, unspecified: Secondary | ICD-10-CM | POA: Insufficient documentation

## 2021-11-04 DIAGNOSIS — R079 Chest pain, unspecified: Secondary | ICD-10-CM | POA: Diagnosis not present

## 2021-11-04 DIAGNOSIS — R0789 Other chest pain: Secondary | ICD-10-CM | POA: Diagnosis not present

## 2021-11-04 DIAGNOSIS — M79602 Pain in left arm: Secondary | ICD-10-CM | POA: Diagnosis present

## 2021-11-04 LAB — CBC
HCT: 39 % (ref 39.0–52.0)
Hemoglobin: 12.5 g/dL — ABNORMAL LOW (ref 13.0–17.0)
MCH: 27.2 pg (ref 26.0–34.0)
MCHC: 32.1 g/dL (ref 30.0–36.0)
MCV: 85 fL (ref 80.0–100.0)
Platelets: 214 10*3/uL (ref 150–400)
RBC: 4.59 MIL/uL (ref 4.22–5.81)
RDW: 15.4 % (ref 11.5–15.5)
WBC: 4.8 10*3/uL (ref 4.0–10.5)
nRBC: 0 % (ref 0.0–0.2)

## 2021-11-04 LAB — BASIC METABOLIC PANEL
Anion gap: 8 (ref 5–15)
BUN: 18 mg/dL (ref 6–20)
CO2: 21 mmol/L — ABNORMAL LOW (ref 22–32)
Calcium: 9.2 mg/dL (ref 8.9–10.3)
Chloride: 112 mmol/L — ABNORMAL HIGH (ref 98–111)
Creatinine, Ser: 0.87 mg/dL (ref 0.61–1.24)
GFR, Estimated: >60 mL/min (ref >60)
Glucose, Bld: 106 mg/dL — ABNORMAL HIGH (ref 70–99)
Potassium: 3.9 mmol/L (ref 3.5–5.1)
Sodium: 141 mmol/L (ref 135–145)

## 2021-11-04 LAB — TROPONIN I (HIGH SENSITIVITY): Troponin I (High Sensitivity): 3 ng/L (ref <18)

## 2021-11-04 MED ORDER — PREDNISONE 50 MG PO TABS
50.0000 mg | ORAL_TABLET | Freq: Every day | ORAL | 0 refills | Status: DC
  Filled 2021-11-04: qty 4, 4d supply, fill #0

## 2021-11-04 NOTE — ED Provider Notes (Signed)
[facility] Provider Note    Event Date/Time   First MD Initiated Contact with Patient 11/04/21 1114     (approximate)   History   Left arm pain   HPI  Travis Huynh is a 56 y.o. male with a history of high blood pressure, CKD, who presents with complaints of intermittent burning and tingling sensation from his left elbow to the palm of his hand and fingers.  He reports this is been going on for about 5 to 6 days.  It seems to improve when he shakes his hand.  Currently feels normal.  No neck pain reported.  No injury.  No chest pain reported to me.     Physical Exam   Triage Vital Signs: ED Triage Vitals  Enc Vitals Group     BP 11/04/21 1035 137/87     Pulse Rate 11/04/21 1035 (!) 48     Resp 11/04/21 1035 17     Temp 11/04/21 1035 98.8 F (37.1 C)     Temp Source 11/04/21 1035 Oral     SpO2 11/04/21 1035 100 %     Weight 11/04/21 1031 68 kg (150 lb)     Height 11/04/21 1031 1.702 m (5\' 7" )     Head Circumference --      Peak Flow --      Pain Score 11/04/21 1031 7     Pain Loc --      Pain Edu? --      Excl. in [place]? --     Most recent vital signs: Vitals:   11/04/21 1035 11/04/21 1247  BP: 137/87 135/77  Pulse: (!) 48 60  Resp: 17 17  Temp: 98.8 F (37.1 C) 98.1 F (36.7 C)  SpO2: 100% 96%     General: Awake, no distress.  CV:  Good peripheral perfusion.  Regular rate and rhythm Resp:  Normal effort.  CTA B Abd:  No distention.  Other:  No vertebral tenderness palpation, normal strength in the extremities.  Normal sensation in the upper extremities   ED Results / Procedures / Treatments   Labs (all labs ordered are listed, but only abnormal results are displayed) Labs Reviewed  BASIC METABOLIC PANEL - Abnormal; Notable for the following components:      Result Value   Chloride 112 (*)    CO2 21 (*)    Glucose, Bld 106 (*)    All other components within normal limits  CBC - Abnormal; Notable for the following  components:   Hemoglobin 12.5 (*)    All other components within normal limits  TROPONIN I (HIGH SENSITIVITY)     EKG  ED ECG REPORT I, Lavonia Drafts, the attending physician, personally viewed and interpreted this ECG.  Date: 11/04/2021  Rhythm: normal sinus rhythm QRS Axis: normal Intervals: normal ST/T Wave abnormalities: normal Narrative Interpretation: no evidence of acute ischemia    RADIOLOGY Chest x-ray viewed interpreted by me, no acute abnormality    PROCEDURES:  Critical Care performed:   Procedures   MEDICATIONS ORDERED IN ED: Medications - No data to display   IMPRESSION / MDM / [place] / ED COURSE  I reviewed the triage vital signs and the nursing notes.  Triage note noted chest discomfort however patient denies this to me.  He describes tingling/numbness sensation intermittently between his left elbow and fingers, not occurring currently.  Suspicious for radiculopathy  Lab work today is reassuring, troponin EKG normal, chest x-ray is unremarkable.  Discussed with patient that he will need follow-up with neurology, will trial steroids to see if this is helps, return precautions discussed          FINAL CLINICAL IMPRESSION(S) / ED DIAGNOSES   Final diagnoses:  Radiculopathy affecting upper extremity     Rx / DC Orders   ED Discharge Orders          Ordered    predniSONE (DELTASONE) 50 MG tablet  Daily with breakfast        11/04/21 1211             Note:  This document was prepared using Dragon voice recognition software and may include unintentional dictation errors.   Lavonia Drafts, MD 11/04/21 [phone]

## 2021-11-04 NOTE — ED Triage Notes (Signed)
Pt c/o left sided chest pain with numb/burning sensation in the left arm intermittently over the past 5 days, pt is in NAD at present

## 2021-11-04 NOTE — Telephone Encounter (Signed)
Chief Complaint Arm Pain (no known cause) Reason for Call Symptomatic / Request for [place] states her husband has been experiencing arm pain and tingling. Translation No Nurse Assessment Nurse: Raphael Gibney, RN, Vanita Ingles Date/Time (Eastern Time): 11/04/2021 9:52:55 AM Confirm and document reason for call. If symptomatic, describe symptoms. ---Caller states he has pain in his left arm and his fingers feel numb at times. Pain is sharp. No numbness now. Pain is intermittent. pain level 7. Does the patient have any new or worsening symptoms? ---Yes Will a triage be completed? ---Yes Related visit to physician within the last 2 weeks? ---No Does the PT have any chronic conditions? (i.e. diabetes, asthma, this includes High risk factors for pregnancy, etc.) ---Yes List chronic conditions. ---HTN; prediabetes Is this a behavioral health or substance abuse call? ---No Guidelines Guideline Title Affirmed Question Affirmed Notes Nurse Date/Time (Eastern Time) Arm Pain [1] Age > 40 AND [2] no obvious cause AND [3] pain even when not moving the arm (Exception: pain is clearly made worse by moving arm or bending neck) Raphael Gibney, RN, Vanita Ingles 11/04/2021 10:00:57 AM PLEASE NOTE: All timestamps contained within this report are represented as [place] Standard Time. CONFIDENTIALTY NOTICE: This fax transmission is intended only for the addressee. It contains information that is legally privileged, confidential or otherwise protected from use or disclosure. If you are not the intended recipient, you are strictly prohibited from reviewing, disclosing, copying using or disseminating any of this information or taking any action in reliance on or regarding this information. If you have received this fax in error, please notify us immediately by telephone so that we can arrange for its return to [place]. Phone: [phone], Toll-Free: [phone], Fax: [phone] Page: 2 of 2 Call  Id: [id number] [place]. Time Eilene Ghazi Time) Disposition Final User 11/04/2021 9:36:00 AM Attempt made - message left Pryor Montes 11/04/2021 9:37:37 AM Attempt made - message left Pryor Montes 11/04/2021 9:54:45 AM Attempt made - message left Raphael Gibney, RN, Vanita Ingles 11/04/2021 10:02:53 AM Go to ED Now Yes Raphael Gibney, RN, Doreatha Lew Disagree/Comply Comply Caller Understands Yes PreDisposition Call Doctor Care Advice Given Per Guideline GO TO ED NOW: * You need to be seen in the Emergency Department. Comments User: Dannielle Burn, RN Date/Time Eilene Ghazi Time): 11/04/2021 9:35:50 AM Called primary number and left message. Will try secondary number User: Dannielle Burn, RN Date/Time Eilene Ghazi Time): 11/04/2021 9:37:26 AM Called secondary number and left message. will try primary number in a few min User: Dannielle Burn, RN Date/Time Eilene Ghazi Time): 11/04/2021 9:54:35 AM Called primary number and spouse states she is not with him and to try the secondary number. he just left for work Referrals [facility] - ED

## 2021-11-04 NOTE — Telephone Encounter (Signed)
Noted. Diagnosed with radiculopathy of upper extremity.

## 2021-11-13 ENCOUNTER — Other Ambulatory Visit: Payer: Self-pay

## 2021-11-13 DIAGNOSIS — R202 Paresthesia of skin: Secondary | ICD-10-CM | POA: Diagnosis not present

## 2021-11-13 DIAGNOSIS — R208 Other disturbances of skin sensation: Secondary | ICD-10-CM | POA: Diagnosis not present

## 2021-11-13 DIAGNOSIS — R200 Anesthesia of skin: Secondary | ICD-10-CM | POA: Insufficient documentation

## 2021-11-13 DIAGNOSIS — M25522 Pain in left elbow: Secondary | ICD-10-CM | POA: Diagnosis not present

## 2021-11-13 MED ORDER — GABAPENTIN 100 MG PO CAPS
ORAL_CAPSULE | ORAL | 3 refills | Status: DC
  Filled 2021-11-13: qty 120, 30d supply, fill #0
  Filled 2021-12-23: qty 120, 30d supply, fill #1
  Filled 2022-01-20: qty 120, 30d supply, fill #2
  Filled 2022-02-20: qty 120, 30d supply, fill #3

## 2021-11-26 NOTE — Progress Notes (Deleted)
?Cardiology Office Note:   ? ?Date:  11/26/2021  ? ?ID:  Travis Huynh, DOB Apr 19, 1966, MRN [id number] ? ?PCP:  Wendie Agreste, MD ?  ?[place]  ?Cardiologist:  Freada Bergeron, MD  ?Advanced Practice Provider:  No care team member to display ?Electrophysiologist:  None  ? ?Referring MD: Wendie Agreste, MD  ? ? ? ?History of Present Illness:   ? ?Travis Huynh is a 56 y.o. male with a hx of HTN and anxiety who presents to clinic for follow-up. ? ?Patient initially seen in was initially seen in 10/2020 for palpitations. Cardiac monitor at that time with brief runs of nonsustained SVT with longest lasting 13beats. Was seen in 01/2021 where he continued to have palpitations in the setting of stress and missing doses of the metoprolol. On visit with Farabaugh Dopp on 05/2021 where his palpitations were well controlled. Continued to have left arm pain/heaviness that was not exertional in nature.  ? ?Today, *** ? ?Family history notable for mother with CAD at age 22. No known arrhythmias or SCD in the family. ? ?Past Medical History:  ?Diagnosis Date  ? Anxiety   ? CKD (chronic kidney disease), stage II   ? Hyperlipidemia   ? past hx- controlled  ? Hypertension   ? Palpitations   ? ? ?Past Surgical History:  ?Procedure Laterality Date  ? COLONOSCOPY    ? MOUTH SURGERY    ? removed teeth  ? POLYPECTOMY    ? ? ?Current Medications: ?No outpatient medications have been marked as taking for the 12/01/21 encounter (Appointment) with Freada Bergeron, MD.  ?  ? ?Allergies:   Patient has no known allergies.  ? ?Social History  ? ?Socioeconomic History  ? Marital status: Married  ?  Spouse name: Not on file  ? Number of children: Not on file  ? Years of education: Not on file  ? Highest education level: Not on file  ?Occupational History  ? Not on file  ?Tobacco Use  ? Smoking status: Every Day  ?  Packs/day: 0.25  ?  Types: Cigarettes  ? Smokeless tobacco: Never  ? Tobacco comments:  ?  pt  smokes about 2 cigarettes a day   ?Vaping Use  ? Vaping Use: Never used  ?Substance and Sexual Activity  ? Alcohol use: Yes  ?  Alcohol/week: 1.0 standard drink  ?  Types: 1 Cans of beer per week  ?  Comment: occasionally  ? Drug use: Never  ? Sexual activity: Yes  ?Other Topics Concern  ? Not on file  ?Social History Narrative  ? Not on file  ? ?Social Determinants of Health  ? ?Financial Resource Strain: Not on file  ?Food Insecurity: Not on file  ?Transportation Needs: Not on file  ?Physical Activity: Not on file  ?Stress: Not on file  ?Social Connections: Not on file  ?  ? ?Family History: ?The patient's family history includes Prostate cancer in his maternal uncle. There is no history of Colon cancer, Colon polyps, Esophageal cancer, Stomach cancer, or Rectal cancer. ? ?ROS:   ?Please see the history of present illness.    ?Review of Systems  ?Constitutional:  Negative for chills and fever.  ?HENT:  Negative for hearing loss and sore throat.   ?Respiratory:  Positive for shortness of breath.   ?Cardiovascular:  Positive for palpitations. Negative for chest pain, orthopnea, claudication, leg swelling and PND.  ?Gastrointestinal:  Negative for melena, nausea and vomiting.  ?  Genitourinary:  Negative for dysuria and flank pain.  ?Musculoskeletal:  Negative for falls and myalgias.  ?Neurological:  Negative for dizziness and loss of consciousness.  ?Endo/Heme/Allergies:  Negative for polydipsia.  ?Psychiatric/Behavioral:  Negative for depression.   ? ?EKGs/Labs/Other Studies Reviewed:   ? ?The following studies were reviewed today: ?ECG: Sinus bradycardia; no ischemia or block ? ?ETT 06/10/21: ?  No ST deviation was noted. ?  Prior study not available for comparison. ? ?Cardiac monitor 11/2020: ?Patch wear time was 6 days and 13 hours. ?Predominant rhythm was NSR with average HR 59bpm; ranging from 40bpm-132bpm ?There were 2 runs of SVT with fastest/longest interval 13 beats with max rate of 132bpm ?Rare SVE <1%,  rare PVCs <1% ?No afib, VT, or significant pauses ?Patient triggered event correlates with NSR. ?  ?  ?Patch Wear Time:  6 days and 13 hours (2022-03-03T19:07:46-0500 to 2022-03-10T09:06:11-498) ?  ?Patient had a min HR of 40 bpm, max HR of 132 bpm, and avg HR of 59 bpm. Predominant underlying rhythm was Sinus Rhythm. 2 Supraventricular Tachycardia runs occurred, the run with the fastest interval lasting 13 beats with a max rate of 132 bpm (avg 122  ?bpm); the run with the fastest interval was also the longest. Isolated SVEs were rare (<1.0%), SVE Couplets were rare (<1.0%), and SVE Triplets were rare (<1.0%). Isolated VEs were rare (<1.0%), and no VE Couplets or VE Triplets were present.  ?  ?  ?Negative adequate stress test without evidence of ischemia at given workload. ? ?Recent Labs: ?08/06/2021: ALT 19 ?11/04/2021: BUN 18; Creatinine, Ser 0.87; Hemoglobin 12.5; Platelets 214; Potassium 3.9; Sodium 141  ?Recent Lipid Panel ?   ?Component Value Date/Time  ? CHOL 218 (H) 08/06/2021 2585  ? CHOL 208 (H) 02/21/2020 1058  ? TRIG 98.0 08/06/2021 0928  ? HDL 42.90 08/06/2021 0928  ? HDL 51 02/21/2020 1058  ? CHOLHDL 5 08/06/2021 0928  ? VLDL 19.6 08/06/2021 0928  ? [place] 156 (H) 08/06/2021 2778  ? LDLCALC 134 (H) 02/21/2020 1058  ? ? ? ? ?Physical Exam:   ? ?VS:  There were no vitals taken for this visit.   ? ?Wt Readings from Last 3 Encounters:  ?11/04/21 150 lb (68 kg)  ?08/06/21 162 lb 12.8 oz (73.8 kg)  ?07/10/21 150 lb (68 kg)  ?  ? ?GEN:  Well nourished, well developed in no acute distress ?HEENT: Normal ?NECK: No JVD; No carotid bruits ?CARDIAC: RRR, 1/6 systolic murmur. No  rubs, gallops ?RESPIRATORY:  Clear to auscultation without rales, wheezing or rhonchi  ?ABDOMEN: Soft, non-tender, non-distended ?MUSCULOSKELETAL:  No edema; No deformity  ?SKIN: Warm and dry ?NEUROLOGIC:  Alert and oriented x 3 ?PSYCHIATRIC:  Normal affect  ? ?ASSESSMENT:   ? ?No diagnosis found. ? ?PLAN:   ? ?In order of problems listed  above: ? ?#Palpitations: ?Cardiac monitor with brief runs of nonsustained SVT. Symptoms well controlled on metoprolol. ?-Continue metop '25mg'$  BID with extra dose as needed for palpitations ? ?#Left arm heaviness: ?Atypical and likely not cardiac in nature. ETT with no evidence of ischemia. ? ?#HTN: ?-Continue amlodipine '10mg'$  daily ?-Continue metop '25mg'$  BID ? ?#HLD: ?LDL 134.  ?-Discussed mediterranean diet and exercise at length as detailed below ? ?#Tobacco Abuse: ?-Tobacco cessation counseling provided today; patient motivated to quit ? ?Exercise recommendations: ?Goal of exercising for at least 30 minutes a day, at least 5 times per week.  Please exercise to a moderate exertion.  This means that while exercising it  is difficult to speak in full sentences, however you are not so short of breath that you feel you must stop, and not so comfortable that you can carry on a full conversation.  Exertion level should be approximately a 5/10, if 10 is the most exertion you can perform. ? ?Diet recommendations: ?Recommend a heart healthy diet such as the Mediterranean diet.  This diet consists of plant based foods, healthy fats, lean meats, olive oil.  It suggests limiting the intake of simple carbohydrates such as white breads, pastries, and pastas.  It also limits the amount of red meat, wine, and dairy products such as cheese that one should consume on a daily basis. ? ? ?   ? ? ?Medication Adjustments/Labs and Tests Ordered: ?Current medicines are reviewed at length with the patient today.  Concerns regarding medicines are outlined above.  ?No orders of the defined types were placed in this encounter. ? ?No orders of the defined types were placed in this encounter. ? ? ?There are no Patient Instructions on file for this visit. ?  ? ?Signed, ?Freada Bergeron, MD  ?11/26/2021 8:36 PM    ?[place] ?

## 2021-11-28 ENCOUNTER — Other Ambulatory Visit: Payer: Self-pay

## 2021-12-01 ENCOUNTER — Other Ambulatory Visit: Payer: Self-pay

## 2021-12-01 ENCOUNTER — Ambulatory Visit (INDEPENDENT_AMBULATORY_CARE_PROVIDER_SITE_OTHER): Payer: 59 | Admitting: Cardiology

## 2021-12-01 ENCOUNTER — Encounter: Payer: Self-pay | Admitting: Cardiology

## 2021-12-01 VITALS — BP 136/84 | HR 56 | Ht 67.0 in | Wt 163.8 lb

## 2021-12-01 DIAGNOSIS — M79602 Pain in left arm: Secondary | ICD-10-CM | POA: Diagnosis not present

## 2021-12-01 DIAGNOSIS — I10 Essential (primary) hypertension: Secondary | ICD-10-CM | POA: Diagnosis not present

## 2021-12-01 DIAGNOSIS — Z720 Tobacco use: Secondary | ICD-10-CM

## 2021-12-01 DIAGNOSIS — R002 Palpitations: Secondary | ICD-10-CM

## 2021-12-01 DIAGNOSIS — E782 Mixed hyperlipidemia: Secondary | ICD-10-CM

## 2021-12-01 DIAGNOSIS — Z79899 Other long term (current) drug therapy: Secondary | ICD-10-CM

## 2021-12-01 DIAGNOSIS — Z8249 Family history of ischemic heart disease and other diseases of the circulatory system: Secondary | ICD-10-CM | POA: Diagnosis not present

## 2021-12-01 MED ORDER — ROSUVASTATIN CALCIUM 10 MG PO TABS
10.0000 mg | ORAL_TABLET | Freq: Every day | ORAL | 1 refills | Status: DC
  Filled 2021-12-01: qty 90, 90d supply, fill #0
  Filled 2022-03-06: qty 90, 90d supply, fill #1

## 2021-12-01 NOTE — Progress Notes (Signed)
?Cardiology Office Note:   ? ?Date:  12/01/2021  ? ?ID:  Travis Huynh, DOB 1966-01-13, MRN [id number] ? ?PCP:  Travis Agreste, MD ?  ?[place]  ?Cardiologist:  Freada Bergeron, MD  ?Advanced Practice Provider:  No care team member to display ?Electrophysiologist:  None  ? ?Referring MD: Travis Agreste, MD  ? ?History of Present Illness:   ? ?Travis Huynh is a 56 y.o. male with a hx of HTN and anxiety who presents to clinic for follow-up. ? ?Patient initially seen in was initially seen in 10/2020 for palpitations. Cardiac monitor at that time with brief runs of nonsustained SVT with longest lasting 13beats. Was seen in 01/2021 where he continued to have palpitations in the setting of stress and missing doses of the metoprolol. On visit with Durnil Dopp on 05/2021 where his palpitations were well controlled. Continued to have left arm pain/heaviness that was not exertional in nature.  ? ?Family history notable for mother with CAD at age 60. No known arrhythmias or SCD in the family. ? ?Today, he is doing well. His palpitations have improved. However, he endorses a burning sensation along his L arm and was given gabapentin by neurology. The sensation starts as a pressure in his shoulder travelling to his elbow. The burning continues from his elbow to his fingers. The medicine improves the pain for 8 hours but it wears off. He takes the gabapentin twice a day.  ? ?He endorses occasional L ankle swelling which he associates with gout.  ? ?At work, he walks and stays on his feet all day. He is working on cutting out snacking throughout the night. He has switched to Diet and Coke Zero. He is open to starting a statin. He denies chest pain, chest pressure, dyspnea at rest or with exertion, palpitations, PND, orthopnea, or lightheadedness. ? ?Past Medical History:  ?Diagnosis Date  ? Anxiety   ? CKD (chronic kidney disease), stage II   ? Hyperlipidemia   ? past hx- controlled  ?  Hypertension   ? Palpitations   ? ? ?Past Surgical History:  ?Procedure Laterality Date  ? COLONOSCOPY    ? MOUTH SURGERY    ? removed teeth  ? POLYPECTOMY    ? ? ?Current Medications: ?Current Meds  ?Medication Sig  ? amLODipine (NORVASC) 10 MG tablet Take 1 tablet (10 mg total) by mouth daily.  ? colchicine 0.6 MG tablet 1.'2mg'$  once, then additional 0.'6mg'$  1 hour later if needed per attack.  ? gabapentin (NEURONTIN) 100 MG capsule Take 100 mg twice a day for one week, then increase to 200 mg(2 tablets) twice a day and continue  ? metoprolol tartrate (LOPRESSOR) 25 MG tablet Take 1 tablet (25 mg total) by mouth 2 (two) times daily.  ? rosuvastatin (CRESTOR) 10 MG tablet Take 1 tablet (10 mg total) by mouth daily.  ?  ? ?Allergies:   Patient has no known allergies.  ? ?Social History  ? ?Socioeconomic History  ? Marital status: Married  ?  Spouse name: Not on file  ? Number of children: Not on file  ? Years of education: Not on file  ? Highest education level: Not on file  ?Occupational History  ? Not on file  ?Tobacco Use  ? Smoking status: Every Day  ?  Packs/day: 0.25  ?  Types: Cigarettes  ? Smokeless tobacco: Never  ? Tobacco comments:  ?  pt smokes about 2 cigarettes a day   ?Vaping  Use  ? Vaping Use: Never used  ?Substance and Sexual Activity  ? Alcohol use: Yes  ?  Alcohol/week: 1.0 standard drink  ?  Types: 1 Cans of beer per week  ?  Comment: occasionally  ? Drug use: Never  ? Sexual activity: Yes  ?Other Topics Concern  ? Not on file  ?Social History Narrative  ? Not on file  ? ?Social Determinants of Health  ? ?Financial Resource Strain: Not on file  ?Food Insecurity: Not on file  ?Transportation Needs: Not on file  ?Physical Activity: Not on file  ?Stress: Not on file  ?Social Connections: Not on file  ?  ? ?Family History: ?The patient's family history includes Prostate cancer in his maternal uncle. There is no history of Colon cancer, Colon polyps, Esophageal cancer, Stomach cancer, or Rectal  cancer. ? ?ROS:   ?Please see the history of present illness.    ?Review of Systems  ?Constitutional:  Negative for chills and fever.  ?HENT:  Negative for hearing loss and sore throat.   ?Eyes:  Negative for blurred vision.  ?Respiratory:  Negative for shortness of breath.   ?Cardiovascular:  Positive for leg swelling (L ankle). Negative for chest pain, palpitations, orthopnea, claudication and PND.  ?Gastrointestinal:  Negative for melena, nausea and vomiting.  ?Genitourinary:  Negative for dysuria and flank pain.  ?Musculoskeletal:  Negative for falls and myalgias.  ?Skin:  Negative for rash.  ?Neurological:  Positive for tingling (L arm). Negative for dizziness and loss of consciousness.  ?Endo/Heme/Allergies:  Negative for polydipsia.  ?Psychiatric/Behavioral:  Negative for depression.   ? ?EKGs/Labs/Other Studies Reviewed:   ? ?The following studies were reviewed today: ?ETT 06/10/21: ?  No ST deviation was noted. ?  Prior study not available for comparison. ? ?Cardiac monitor 11/2020: ?Patch wear time was 6 days and 13 hours. ?Predominant rhythm was NSR with average HR 59bpm; ranging from 40bpm-132bpm ?There were 2 runs of SVT with fastest/longest interval 13 beats with max rate of 132bpm ?Rare SVE <1%, rare PVCs <1% ?No afib, VT, or significant pauses ?Patient triggered event correlates with NSR. ?  ?  ?Patch Wear Time:  6 days and 13 hours (2022-03-03T19:07:46-0500 to 2022-03-10T09:06:11-498) ?  ?Patient had a min HR of 40 bpm, max HR of 132 bpm, and avg HR of 59 bpm. Predominant underlying rhythm was Sinus Rhythm. 2 Supraventricular Tachycardia runs occurred, the run with the fastest interval lasting 13 beats with a max rate of 132 bpm (avg 122  ?bpm); the run with the fastest interval was also the longest. Isolated SVEs were rare (<1.0%), SVE Couplets were rare (<1.0%), and SVE Triplets were rare (<1.0%). Isolated VEs were rare (<1.0%), and no VE Couplets or VE Triplets were present.  ?  ?Negative  adequate stress test without evidence of ischemia at given workload. ? ?ECG: EKG was not ordered today ?11/04/2020: Sinus bradycardia; no ischemia or block ? ?Recent Labs: ?08/06/2021: ALT 19 ?11/04/2021: BUN 18; Creatinine, Ser 0.87; Hemoglobin 12.5; Platelets 214; Potassium 3.9; Sodium 141  ?Recent Lipid Panel ?   ?Component Value Date/Time  ? CHOL 218 (H) 08/06/2021 1191  ? CHOL 208 (H) 02/21/2020 1058  ? TRIG 98.0 08/06/2021 0928  ? HDL 42.90 08/06/2021 0928  ? HDL 51 02/21/2020 1058  ? CHOLHDL 5 08/06/2021 0928  ? VLDL 19.6 08/06/2021 0928  ? [place] 156 (H) 08/06/2021 4782  ? LDLCALC 134 (H) 02/21/2020 1058  ? ? ? ? ?Physical Exam:   ? ?VS:  BP 136/84   Pulse (!) 56   Ht '5\' 7"'$  (1.702 m)   Wt 163 lb 12.8 oz (74.3 kg)   SpO2 99%   BMI 25.65 kg/m?    ? ?Wt Readings from Last 3 Encounters:  ?12/01/21 163 lb 12.8 oz (74.3 kg)  ?11/04/21 150 lb (68 kg)  ?08/06/21 162 lb 12.8 oz (73.8 kg)  ?  ? ?GEN:  Well nourished, well developed in no acute distress ?HEENT: Normal ?NECK: No JVD; No carotid bruits ?CARDIAC: RRR, no murmurs, rubs, gallops ?RESPIRATORY:  Clear to auscultation without rales, wheezing or rhonchi  ?ABDOMEN: Soft, non-tender, non-distended ?MUSCULOSKELETAL:  No edema; No deformity  ?SKIN: Warm and dry ?NEUROLOGIC:  Alert and oriented x 3 ?PSYCHIATRIC:  Normal affect  ? ?ASSESSMENT:   ? ?1. Palpitations   ?2. Medication management   ?3. Mixed hyperlipidemia   ?4. Family history of cardiovascular disease   ?5. Left arm pain   ?6. Tobacco abuse   ?7. Essential hypertension   ? ? ?PLAN:   ? ?In order of problems listed above: ? ?#Palpitations: ?Cardiac monitor with brief runs of nonsustained SVT. Symptoms well controlled on metoprolol. ?-Continue metop '25mg'$  BID with extra dose as needed for palpitations ? ?#Left arm heaviness: ?Atypical and likely not cardiac in nature. ETT with no evidence of ischemia. ? ?#HTN: ?Well controlled at home. ?-Continue amlodipine '10mg'$  daily ?-Continue metop '25mg'$   BID ? ?#HLD: ?LDL 156. Discussed option of continued lifestyle vs statin therapy vs coronary Ca score +/- statin therapy. He is open to statin therapy at this time.  ?-Start crestor '10mg'$  daily ?-Declined Ca score ?-Continue lifestyle mod

## 2021-12-01 NOTE — Patient Instructions (Signed)
Medication Instructions:  ? ?START TAKING ROSUVASTATIN (CRESTOR) 10 MG BY MOUTH DAILY ? ?*If you need a refill on your cardiac medications before your next appointment, please call your pharmacy* ? ? ?Lab Work: ? ?IN 6-8 WEEKS HERE IN THE OFFICE TO CHECK LIPIDS--PLEASE COME FASTING TO THIS LAB APPOINTMENT ? ?If you have labs (blood work) drawn today and your tests are completely normal, you will receive your results only by: ?MyChart Message (if you have MyChart) OR ?A paper copy in the mail ?If you have any lab test that is abnormal or we need to change your treatment, we will call you to review the results. ? ? ? ?Follow-Up: ?At [facility], you and your health needs are our priority.  As part of our continuing mission to provide you with exceptional heart care, we have created designated Provider Care Teams.  These Care Teams include your primary Cardiologist (physician) and Advanced Practice Providers (APPs -  Physician Assistants and Nurse Practitioners) who all work together to provide you with the care you need, when you need it. ? ?We recommend signing up for the patient portal called "MyChart".  Sign up information is provided on this After Visit Summary.  MyChart is used to connect with patients for Virtual Visits (Telemedicine).  Patients are able to view lab/test results, encounter notes, upcoming appointments, etc.  Non-urgent messages can be sent to your provider as well.   ?To learn more about what you can do with MyChart, go to [email or web address].   ? ?Your next appointment:   ?6 month(s) ? ?The format for your next appointment:   ?In Person ? ?Provider:   ?Freada Bergeron, MD { ? ? ? ?

## 2021-12-04 ENCOUNTER — Telehealth: Payer: Self-pay

## 2021-12-04 ENCOUNTER — Other Ambulatory Visit: Payer: Self-pay

## 2021-12-04 DIAGNOSIS — M109 Gout, unspecified: Secondary | ICD-10-CM

## 2021-12-04 MED ORDER — COLCHICINE 0.6 MG PO TABS
ORAL_TABLET | ORAL | 0 refills | Status: DC
  Filled 2021-12-04: qty 12, 4d supply, fill #0

## 2021-12-04 NOTE — Telephone Encounter (Signed)
Sent to pharmacy pt must keep appt 01/29/22 ?

## 2021-12-04 NOTE — Telephone Encounter (Signed)
LAST OV:06/03/2021 ? ?NEXT OV:01/2022 ? ?MEDICATION: colchicine 0.6 MG tablet ? ?[facility] ? ?Comments: Patient is completely out.  ? ?**Let patient know to contact pharmacy at the end of the day to make sure medication is ready. ** ? ?** Please notify patient to allow 48-72 hours to process** ? ?**Encourage patient to contact the pharmacy for refills or they can request refills through [facility]** ? ? ?

## 2021-12-08 ENCOUNTER — Other Ambulatory Visit: Payer: Self-pay

## 2021-12-09 ENCOUNTER — Other Ambulatory Visit: Payer: Self-pay

## 2021-12-23 ENCOUNTER — Other Ambulatory Visit: Payer: Self-pay

## 2022-01-05 DIAGNOSIS — R200 Anesthesia of skin: Secondary | ICD-10-CM | POA: Diagnosis not present

## 2022-01-05 DIAGNOSIS — R202 Paresthesia of skin: Secondary | ICD-10-CM | POA: Diagnosis not present

## 2022-01-19 ENCOUNTER — Other Ambulatory Visit: Payer: 59 | Admitting: *Deleted

## 2022-01-19 DIAGNOSIS — Z8249 Family history of ischemic heart disease and other diseases of the circulatory system: Secondary | ICD-10-CM

## 2022-01-19 DIAGNOSIS — Z79899 Other long term (current) drug therapy: Secondary | ICD-10-CM

## 2022-01-19 DIAGNOSIS — E782 Mixed hyperlipidemia: Secondary | ICD-10-CM | POA: Diagnosis not present

## 2022-01-19 LAB — LIPID PANEL
Chol/HDL Ratio: 3.8 ratio (ref 0.0–5.0)
Cholesterol, Total: 152 mg/dL (ref 100–199)
HDL: 40 mg/dL (ref >39)
LDL Chol Calc (NIH): 89 mg/dL (ref 0–99)
Triglycerides: 131 mg/dL (ref 0–149)
VLDL Cholesterol Cal: 23 mg/dL (ref 5–40)

## 2022-01-20 ENCOUNTER — Other Ambulatory Visit: Payer: Self-pay

## 2022-01-29 ENCOUNTER — Ambulatory Visit: Payer: 59 | Admitting: Family Medicine

## 2022-01-29 ENCOUNTER — Other Ambulatory Visit: Payer: Self-pay

## 2022-01-29 VITALS — BP 132/78 | HR 68 | Temp 98.1°F | Resp 16 | Ht 67.0 in | Wt 164.8 lb

## 2022-01-29 DIAGNOSIS — R202 Paresthesia of skin: Secondary | ICD-10-CM | POA: Diagnosis not present

## 2022-01-29 DIAGNOSIS — E785 Hyperlipidemia, unspecified: Secondary | ICD-10-CM | POA: Diagnosis not present

## 2022-01-29 DIAGNOSIS — R7303 Prediabetes: Secondary | ICD-10-CM

## 2022-01-29 DIAGNOSIS — R200 Anesthesia of skin: Secondary | ICD-10-CM | POA: Diagnosis not present

## 2022-01-29 DIAGNOSIS — M109 Gout, unspecified: Secondary | ICD-10-CM

## 2022-01-29 DIAGNOSIS — I10 Essential (primary) hypertension: Secondary | ICD-10-CM | POA: Diagnosis not present

## 2022-01-29 LAB — COMPREHENSIVE METABOLIC PANEL
ALT: 20 U/L (ref 0–53)
AST: 18 U/L (ref 0–37)
Albumin: 4.4 g/dL (ref 3.5–5.2)
Alkaline Phosphatase: 81 U/L (ref 39–117)
BUN: 10 mg/dL (ref 6–23)
CO2: 25 mEq/L (ref 19–32)
Calcium: 9.6 mg/dL (ref 8.4–10.5)
Chloride: 106 mEq/L (ref 96–112)
Creatinine, Ser: 0.97 mg/dL (ref 0.40–1.50)
GFR: 87.26 mL/min (ref >60.00)
Glucose, Bld: 90 mg/dL (ref 70–99)
Potassium: 4.4 mEq/L (ref 3.5–5.1)
Sodium: 141 mEq/L (ref 135–145)
Total Bilirubin: 0.6 mg/dL (ref 0.2–1.2)
Total Protein: 7.3 g/dL (ref 6.0–8.3)

## 2022-01-29 LAB — HEMOGLOBIN A1C: Hgb A1c MFr Bld: 6.4 % (ref 4.6–6.5)

## 2022-01-29 LAB — URIC ACID: Uric Acid, Serum: 8.1 mg/dL — ABNORMAL HIGH (ref 4.0–7.8)

## 2022-01-29 MED ORDER — METOPROLOL TARTRATE 25 MG PO TABS
25.0000 mg | ORAL_TABLET | Freq: Two times a day (BID) | ORAL | 1 refills | Status: DC
  Filled 2022-01-29: qty 180, 90d supply, fill #0

## 2022-01-29 MED ORDER — COLCHICINE 0.6 MG PO TABS
ORAL_TABLET | ORAL | 0 refills | Status: DC
  Filled 2022-01-29: qty 12, 4d supply, fill #0

## 2022-01-29 MED ORDER — AMLODIPINE BESYLATE 10 MG PO TABS
10.0000 mg | ORAL_TABLET | Freq: Every day | ORAL | 1 refills | Status: DC
Start: 2022-01-29 — End: 2022-08-07
  Filled 2022-01-29: qty 90, 90d supply, fill #0
  Filled 2022-06-22: qty 90, 90d supply, fill #1

## 2022-01-29 NOTE — Progress Notes (Signed)
Subjective:  Patient ID: Travis Huynh, male    DOB: Oct 27, 1965  Age: 56 y.o. MRN: [id number]  CC:  Chief Complaint  Patient presents with   Hyperlipidemia    Pt is fasting for lab work    Hypertension    Denies physical sxs    Prediabetes    Pt here for recheck no concerns per pt    Gout    Pt has requested refill no concerns has been well     HPI Travis Huynh presents for   Hypertension: Amlodipine 10 mg daily, metoprolol 25 mg twice daily. Home readings: unknown readings. Stable.  No new med side effects BP Readings from Last 3 Encounters:  01/29/22 132/78  12/01/21 136/84  11/04/21 135/77   Lab Results  Component Value Date   CREATININE 0.87 11/04/2021   Prediabetes: Physical job, very active.  Diet soda. No sweet tea.  Fast food - less than once per week.  Lab Results  Component Value Date   HGBA1C 6.3 08/06/2021   Wt Readings from Last 3 Encounters:  01/29/22 164 lb 12.8 oz (74.8 kg)  12/01/21 163 lb 12.8 oz (74.3 kg)  11/04/21 150 lb (68 kg)   Gout: Last flare: last month. 3 in past year.  Daily meds:no daily meds.  Prn med: Colchicine 0.6 mg as needed.  Lab Results  Component Value Date   LABURIC 8.2 09/10/2021   Hyperlipidemia: Crestor 10 mg daily. No new myalgias. Recent labs noted form cardiology.   burning in left arm - radiating pain from lower neck to arm and fingers, with numbness, treated by [facility], treated with gabapentin, nerve conduction studies normal on review 01/05/22. Recommended bracing.  Had to increase to '300mg'$  BID. Better. Still some burning.  Has follow up in next month. Burning in elbow area, tingling in fingertips. No R arm sx's.  Lab Results  Component Value Date   CHOL 152 01/19/2022   HDL 40 01/19/2022   LDLCALC 89 01/19/2022   TRIG 131 01/19/2022   CHOLHDL 3.8 01/19/2022   Lab Results  Component Value Date   ALT 19 08/06/2021   AST 14 08/06/2021   ALKPHOS 61 08/06/2021   BILITOT 0.3  08/06/2021     History Patient Active Problem List   Diagnosis Date Noted   Left knee pain 03/10/2021   Hypertension    Tobacco abuse    Acute renal failure superimposed on stage 2 chronic kidney disease (HCC)    Past Medical History:  Diagnosis Date   Anxiety    CKD (chronic kidney disease), stage II    Hyperlipidemia    past hx- controlled   Hypertension    Palpitations    Past Surgical History:  Procedure Laterality Date   COLONOSCOPY     MOUTH SURGERY     removed teeth   POLYPECTOMY     No Known Allergies Prior to Admission medications   Medication Sig Start Date End Date Taking? Authorizing Provider  amLODipine (NORVASC) 10 MG tablet Take 1 tablet (10 mg total) by mouth daily. 08/06/21 02/26/22 Yes Wendie Agreste, MD  colchicine 0.6 MG tablet 1.'2mg'$  once, then additional 0.'6mg'$  1 hour later if needed per attack. 12/04/21  Yes Wendie Agreste, MD  gabapentin (NEURONTIN) 100 MG capsule Take 100 mg twice a day for one week, then increase to 200 mg(2 tablets) twice a day and continue 11/13/21  Yes   metoprolol tartrate (LOPRESSOR) 25 MG tablet Take 1 tablet (25 mg  total) by mouth 2 (two) times daily. 08/06/21  Yes Wendie Agreste, MD  rosuvastatin (CRESTOR) 10 MG tablet Take 1 tablet (10 mg total) by mouth daily. 12/01/21  Yes Freada Bergeron, MD  predniSONE (DELTASONE) 50 MG tablet Take 1 tablet (50 mg total) by mouth daily with breakfast. Patient not taking: Reported on 01/29/2022 11/04/21   Lavonia Drafts, MD   Social History   Socioeconomic History   Marital status: Married    Spouse name: Not on file   Number of children: Not on file   Years of education: Not on file   Highest education level: Not on file  Occupational History   Not on file  Tobacco Use   Smoking status: Every Day    Packs/day: 0.25    Types: Cigarettes   Smokeless tobacco: Never   Tobacco comments:    pt smokes about 2 cigarettes a day   Vaping Use   Vaping Use: Never used   Substance and Sexual Activity   Alcohol use: Yes    Alcohol/week: 1.0 standard drink    Types: 1 Cans of beer per week    Comment: occasionally   Drug use: Never   Sexual activity: Yes  Other Topics Concern   Not on file  Social History Narrative   Not on file   Social Determinants of Health   Financial Resource Strain: Not on file  Food Insecurity: Not on file  Transportation Needs: Not on file  Physical Activity: Not on file  Stress: Not on file  Social Connections: Not on file  Intimate Partner Violence: Not on file    Review of Systems Per HPI  Objective:   Vitals:   01/29/22 0847  BP: 132/78  Pulse: 68  Resp: 16  Temp: 98.1 F (36.7 C)  TempSrc: Temporal  SpO2: 97%  Weight: 164 lb 12.8 oz (74.8 kg)  Height: '5\' 7"'$  (1.702 m)     Physical Exam Vitals reviewed.  Constitutional:      Appearance: He is well-developed.  HENT:     Head: Normocephalic and atraumatic.  Neck:     Vascular: No carotid bruit or JVD.  Cardiovascular:     Rate and Rhythm: Normal rate and regular rhythm.     Heart sounds: Normal heart sounds. No murmur heard. Pulmonary:     Effort: Pulmonary effort is normal.     Breath sounds: Normal breath sounds. No rales.  Musculoskeletal:     Right lower leg: No edema.     Left lower leg: No edema.     Comments: C-spine, no midline bony tenderness.  Pain-free range of motion and does not reproduce left arm symptoms Left shoulder, no bony tenderness, clavicle nontender.  Pain-free shoulder range of motion. Left elbow -no bony tenderness, including over epicondyles.  Radial head nontender, radial tunnel nontender, no dysesthesias with percussion.  Ulnar groove nontender, negative Tinel's.  Pain-free range of motion of left elbow. Left wrist, negative Tinel, negative Phalen.  No focal bony tenderness.  Skin:    General: Skin is warm and dry.  Neurological:     Mental Status: He is alert and oriented to person, place, and time.  Psychiatric:         Mood and Affect: Mood normal.       Assessment & Plan:  Travis Huynh is a 56 y.o. male . Prediabetes - Plan: Comprehensive metabolic panel, Hemoglobin A1c  -Updated labs ordered, continue to watch diet, exercise.  Essential hypertension - Plan:  Comprehensive metabolic panel, amLODipine (NORVASC) 10 MG tablet, metoprolol tartrate (LOPRESSOR) 25 MG tablet  -Stable, ongoing care with cardiology as well.  Continue same dose amlodipine, metoprolol.  Acute gout of left knee, unspecified cause - Plan: colchicine 0.6 MG tablet, Uric acid  -Few gout flares as above, check uric acid and would consider allopurinol low-dose daily if over 6.  Continue colchicine if needed for flares.  Hyperlipidemia, unspecified hyperlipidemia type - Plan: Comprehensive metabolic panel  -Stable on recent labs.  Continue statin same dose.  Numbness and tingling in left arm  -Possible radiculopathy but recent nerve conduction studies reassuring.  Improved with gabapentin at 300 mg twice daily.  Continue follow-up with specialist like another clinic.  I am also happy to see him if needed for further work-up.  Check A1c as above but less likely diabetic neuropathy.  Meds ordered this encounter  Medications   amLODipine (NORVASC) 10 MG tablet    Sig: Take 1 tablet (10 mg total) by mouth daily.    Dispense:  90 tablet    Refill:  1   colchicine 0.6 MG tablet    Sig: 1.'2mg'$  once, then additional 0.'6mg'$  1 hour later if needed per attack.    Dispense:  12 tablet    Refill:  0   metoprolol tartrate (LOPRESSOR) 25 MG tablet    Sig: Take 1 tablet (25 mg total) by mouth 2 (two) times daily.    Dispense:  180 tablet    Refill:  1   Patient Instructions  Keep follow up with specialist at [facility] clinic for your arm symptoms. If needed, we can follow up separately to discuss that further. Continue gapapentin for now.  No med changes for now. If uric acid is elevated, I would consider daily med allopurinol.   Colchicine as need for now.  Thanks for coming in today to take care.       Signed,   Merri Ray, MD [facility] 01/29/22 9:25 AM

## 2022-01-29 NOTE — Patient Instructions (Addendum)
Keep follow up with specialist at [facility] clinic for your arm symptoms. If needed, we can follow up separately to discuss that further. Continue gapapentin for now.  No med changes for now. If uric acid is elevated, I would consider daily med allopurinol.  Colchicine as need for now.  Thanks for coming in today to take care.

## 2022-02-09 ENCOUNTER — Other Ambulatory Visit: Payer: Self-pay

## 2022-02-13 ENCOUNTER — Other Ambulatory Visit: Payer: Self-pay

## 2022-02-20 ENCOUNTER — Other Ambulatory Visit: Payer: Self-pay

## 2022-03-06 ENCOUNTER — Other Ambulatory Visit: Payer: Self-pay

## 2022-03-11 ENCOUNTER — Other Ambulatory Visit: Payer: Self-pay

## 2022-03-11 MED ORDER — GABAPENTIN 100 MG PO CAPS
200.0000 mg | ORAL_CAPSULE | Freq: Two times a day (BID) | ORAL | 0 refills | Status: DC
  Filled 2022-03-11: qty 120, 30d supply, fill #0

## 2022-03-17 ENCOUNTER — Other Ambulatory Visit: Payer: Self-pay

## 2022-03-17 DIAGNOSIS — R202 Paresthesia of skin: Secondary | ICD-10-CM | POA: Diagnosis not present

## 2022-03-17 DIAGNOSIS — M25522 Pain in left elbow: Secondary | ICD-10-CM | POA: Diagnosis not present

## 2022-03-17 DIAGNOSIS — R208 Other disturbances of skin sensation: Secondary | ICD-10-CM | POA: Diagnosis not present

## 2022-03-17 DIAGNOSIS — R200 Anesthesia of skin: Secondary | ICD-10-CM | POA: Diagnosis not present

## 2022-03-17 MED ORDER — GABAPENTIN 300 MG PO CAPS
ORAL_CAPSULE | ORAL | 1 refills | Status: DC
  Filled 2022-03-17 – 2022-06-18 (×3): qty 180, 90d supply, fill #0

## 2022-03-18 ENCOUNTER — Other Ambulatory Visit: Payer: Self-pay

## 2022-03-26 ENCOUNTER — Telehealth: Payer: Self-pay | Admitting: Family Medicine

## 2022-03-26 ENCOUNTER — Other Ambulatory Visit: Payer: Self-pay

## 2022-03-26 DIAGNOSIS — M109 Gout, unspecified: Secondary | ICD-10-CM

## 2022-03-26 DIAGNOSIS — I10 Essential (primary) hypertension: Secondary | ICD-10-CM

## 2022-03-26 MED ORDER — COLCHICINE 0.6 MG PO TABS
ORAL_TABLET | ORAL | 0 refills | Status: DC
  Filled 2022-03-26: qty 12, 30d supply, fill #0

## 2022-03-26 MED ORDER — METOPROLOL TARTRATE 25 MG PO TABS
25.0000 mg | ORAL_TABLET | Freq: Two times a day (BID) | ORAL | 1 refills | Status: DC
  Filled 2022-03-26: qty 180, 90d supply, fill #0
  Filled 2022-07-23 (×2): qty 180, 90d supply, fill #1

## 2022-03-26 NOTE — Telephone Encounter (Signed)
Refills have been sent to pharmacy °

## 2022-03-26 NOTE — Telephone Encounter (Signed)
Encourage patient to contact the pharmacy for refills or they can request refills through [facility]  (Please schedule appointment if patient has not been seen in over a year)    [place] TO: [place] [phone]  MEDICATION NAME & DOSE: colchicine 0.6 mg AND metoprolol tartrate 25 mg   NOTES/COMMENTS FROM PATIENT:      [place] office please notify patient: It takes 48-72 hours to process rx refill requests Ask patient to call pharmacy to ensure rx is ready before heading there.

## 2022-04-28 ENCOUNTER — Telehealth: Payer: Self-pay | Admitting: Family Medicine

## 2022-04-28 ENCOUNTER — Other Ambulatory Visit: Payer: Self-pay

## 2022-04-28 DIAGNOSIS — M109 Gout, unspecified: Secondary | ICD-10-CM

## 2022-04-28 MED ORDER — COLCHICINE 0.6 MG PO TABS
ORAL_TABLET | ORAL | 0 refills | Status: DC
  Filled 2022-04-28: qty 12, 4d supply, fill #0

## 2022-04-28 NOTE — Telephone Encounter (Signed)
Encourage patient to contact the pharmacy for refills or they can request refills through [facility]  (Please schedule appointment if patient has not been seen in over a year)    [place] TO: [place] [phone]  MEDICATION NAME & DOSE: colchicine 0.5 mg  NOTES/COMMENTS FROM PATIENT:      [place] office please notify patient: It takes 48-72 hours to process rx refill requests Ask patient to call pharmacy to ensure rx is ready before heading there.

## 2022-04-28 NOTE — Telephone Encounter (Signed)
Med sent.

## 2022-06-01 NOTE — Progress Notes (Deleted)
Cardiology Office Note:    Date:  06/01/2022   ID:  Travis Huynh, DOB 1966/06/03, MRN [id number]  PCP:  Wendie Agreste, MD   [place]  Cardiologist:  Freada Bergeron, MD  Advanced Practice Provider:  No care team member to display Electrophysiologist:  None   Referring MD: Wendie Agreste, MD   History of Present Illness:    Travis Huynh is a 56 y.o. male with a hx of HTN and anxiety who presents to clinic for follow-up.  Patient initially seen in was initially seen in 10/2020 for palpitations. Cardiac monitor at that time with brief runs of nonsustained SVT with longest lasting 13beats. Was seen in 01/2021 where he continued to have palpitations in the setting of stress and missing doses of the metoprolol. On visit with Buckholtz Dopp on 05/2021 where his palpitations were well controlled. Continued to have left arm pain/heaviness that was not exertional in nature.   Was last seen in 11/2021 where he was doing well from a CV standpoint.   Today, ***   Past Medical History:  Diagnosis Date   Anxiety    CKD (chronic kidney disease), stage II    Hyperlipidemia    past hx- controlled   Hypertension    Palpitations     Past Surgical History:  Procedure Laterality Date   COLONOSCOPY     MOUTH SURGERY     removed teeth   POLYPECTOMY      Current Medications: No outpatient medications have been marked as taking for the 06/05/22 encounter (Appointment) with Freada Bergeron, MD.     Allergies:   Patient has no known allergies.   Social History   Socioeconomic History   Marital status: Married    Spouse name: Not on file   Number of children: Not on file   Years of education: Not on file   Highest education level: Not on file  Occupational History   Not on file  Tobacco Use   Smoking status: Every Day    Packs/day: 0.25    Types: Cigarettes   Smokeless tobacco: Never   Tobacco comments:    pt smokes about 2 cigarettes  a day   Vaping Use   Vaping Use: Never used  Substance and Sexual Activity   Alcohol use: Yes    Alcohol/week: 1.0 standard drink of alcohol    Types: 1 Cans of beer per week    Comment: occasionally   Drug use: Never   Sexual activity: Yes  Other Topics Concern   Not on file  Social History Narrative   Not on file   Social Determinants of Health   Financial Resource Strain: Not on file  Food Insecurity: Not on file  Transportation Needs: Not on file  Physical Activity: Not on file  Stress: Not on file  Social Connections: Not on file     Family History: The patient's family history includes Prostate cancer in his maternal uncle. There is no history of Colon cancer, Colon polyps, Esophageal cancer, Stomach cancer, or Rectal cancer.  ROS:   Please see the history of present illness.    Review of Systems  Constitutional:  Negative for chills and fever.  HENT:  Negative for hearing loss and sore throat.   Eyes:  Negative for blurred vision.  Respiratory:  Negative for shortness of breath.   Cardiovascular:  Positive for leg swelling (L ankle). Negative for chest pain, palpitations, orthopnea, claudication and PND.  Gastrointestinal:  Negative for melena, nausea and vomiting.  Genitourinary:  Negative for dysuria and flank pain.  Musculoskeletal:  Negative for falls and myalgias.  Skin:  Negative for rash.  Neurological:  Positive for tingling (L arm). Negative for dizziness and loss of consciousness.  Endo/Heme/Allergies:  Negative for polydipsia.  Psychiatric/Behavioral:  Negative for depression.     EKGs/Labs/Other Studies Reviewed:    The following studies were reviewed today: ETT 2021-06-24:   No ST deviation was noted.   Prior study not available for comparison.  Cardiac monitor 11/2020: Patch wear time was 6 days and 13 hours. Predominant rhythm was NSR with average HR 59bpm; ranging from 40bpm-132bpm There were 2 runs of SVT with fastest/longest interval 13  beats with max rate of 132bpm Rare SVE <1%, rare PVCs <1% No afib, VT, or significant pauses Patient triggered event correlates with NSR.     Patch Wear Time:  6 days and 13 hours (2022-03-03T19:07:46-0500 to 2022-03-10T09:06:11-498)   Patient had a min HR of 40 bpm, max HR of 132 bpm, and avg HR of 59 bpm. Predominant underlying rhythm was Sinus Rhythm. 2 Supraventricular Tachycardia runs occurred, the run with the fastest interval lasting 13 beats with a max rate of 132 bpm (avg 122  bpm); the run with the fastest interval was also the longest. Isolated SVEs were rare (<1.0%), SVE Couplets were rare (<1.0%), and SVE Triplets were rare (<1.0%). Isolated VEs were rare (<1.0%), and no VE Couplets or VE Triplets were present.    Negative adequate stress test without evidence of ischemia at given workload.  ECG: EKG was not ordered today 11/04/2020: Sinus bradycardia; no ischemia or block  Recent Labs: 11/04/2021: Hemoglobin 12.5; Platelets 214 01/29/2022: ALT 20; BUN 10; Creatinine, Ser 0.97; Potassium 4.4; Sodium 141  Recent Lipid Panel    Component Value Date/Time   CHOL 152 01/19/2022 1001   TRIG 131 01/19/2022 1001   HDL 40 01/19/2022 1001   CHOLHDL 3.8 01/19/2022 1001   CHOLHDL 5 08/06/2021 0928   VLDL 19.6 08/06/2021 0928   LDLCALC 89 01/19/2022 1001      Physical Exam:    VS:  There were no vitals taken for this visit.    Wt Readings from Last 3 Encounters:  01/29/22 164 lb 12.8 oz (74.8 kg)  12/01/21 163 lb 12.8 oz (74.3 kg)  11/04/21 150 lb (68 kg)     GEN:  Well nourished, well developed in no acute distress HEENT: Normal NECK: No JVD; No carotid bruits CARDIAC: RRR, no murmurs, rubs, gallops RESPIRATORY:  Clear to auscultation without rales, wheezing or rhonchi  ABDOMEN: Soft, non-tender, non-distended MUSCULOSKELETAL:  No edema; No deformity  SKIN: Warm and dry NEUROLOGIC:  Alert and oriented x 3 PSYCHIATRIC:  Normal affect   ASSESSMENT:    No  diagnosis found.   PLAN:    In order of problems listed above:  #Palpitations: Cardiac monitor with brief runs of nonsustained SVT. Symptoms well controlled on metoprolol. -Continue metop '25mg'$  BID with extra dose as needed for palpitations  #Left arm heaviness: Atypical and likely not cardiac in nature. ETT with no evidence of ischemia.  #HTN: Well controlled at home. -Continue amlodipine '10mg'$  daily -Continue metop '25mg'$  BID  #HLD: -Continue crestor '10mg'$  daily -Declined Ca score -Continue lifestyle modifications as detailed below  #Tobacco Abuse: -Tobacco cessation counseling provided today; patient motivated to quit  Exercise recommendations: Goal of exercising for at least 30 minutes a day, at least 5 times per week.  Please exercise  to a moderate exertion.  This means that while exercising it is difficult to speak in full sentences, however you are not so short of breath that you feel you must stop, and not so comfortable that you can carry on a full conversation.  Exertion level should be approximately a 5/10, if 10 is the most exertion you can perform.  Diet recommendations: Recommend a heart healthy diet such as the Mediterranean diet.  This diet consists of plant based foods, healthy fats, lean meats, olive oil.  It suggests limiting the intake of simple carbohydrates such as white breads, pastries, and pastas.  It also limits the amount of red meat, wine, and dairy products such as cheese that one should consume on a daily basis.   Medication Adjustments/Labs and Tests Ordered: Current medicines are reviewed at length with the patient today.  Concerns regarding medicines are outlined above.  No orders of the defined types were placed in this encounter.  No orders of the defined types were placed in this encounter.   There are no Patient Instructions on file for this visit.    Signed, Freada Bergeron, MD  06/01/2022 7:12 AM    [place]

## 2022-06-05 ENCOUNTER — Ambulatory Visit: Payer: 59 | Admitting: Cardiology

## 2022-06-05 ENCOUNTER — Encounter: Payer: Self-pay | Admitting: Physician Assistant

## 2022-06-05 ENCOUNTER — Other Ambulatory Visit: Payer: Self-pay

## 2022-06-05 ENCOUNTER — Telehealth: Payer: Self-pay | Admitting: Family Medicine

## 2022-06-05 ENCOUNTER — Ambulatory Visit: Payer: 59 | Attending: Cardiology | Admitting: Physician Assistant

## 2022-06-05 VITALS — BP 124/80 | HR 63 | Ht 67.0 in | Wt 161.0 lb

## 2022-06-05 DIAGNOSIS — E782 Mixed hyperlipidemia: Secondary | ICD-10-CM | POA: Diagnosis not present

## 2022-06-05 DIAGNOSIS — I10 Essential (primary) hypertension: Secondary | ICD-10-CM

## 2022-06-05 DIAGNOSIS — M109 Gout, unspecified: Secondary | ICD-10-CM

## 2022-06-05 DIAGNOSIS — Z720 Tobacco use: Secondary | ICD-10-CM | POA: Diagnosis not present

## 2022-06-05 DIAGNOSIS — Z8249 Family history of ischemic heart disease and other diseases of the circulatory system: Secondary | ICD-10-CM | POA: Diagnosis not present

## 2022-06-05 DIAGNOSIS — R002 Palpitations: Secondary | ICD-10-CM

## 2022-06-05 DIAGNOSIS — Z79899 Other long term (current) drug therapy: Secondary | ICD-10-CM | POA: Diagnosis not present

## 2022-06-05 DIAGNOSIS — M79602 Pain in left arm: Secondary | ICD-10-CM | POA: Diagnosis not present

## 2022-06-05 MED ORDER — COLCHICINE 0.6 MG PO TABS
ORAL_TABLET | ORAL | 0 refills | Status: DC
  Filled 2022-06-05: qty 12, 4d supply, fill #0

## 2022-06-05 MED ORDER — ROSUVASTATIN CALCIUM 10 MG PO TABS
10.0000 mg | ORAL_TABLET | Freq: Every day | ORAL | 1 refills | Status: DC
  Filled 2022-06-05: qty 90, 90d supply, fill #0

## 2022-06-05 NOTE — Progress Notes (Signed)
Office Visit    Patient Name: Travis Huynh Date of Encounter: 06/05/2022  PCP:  Travis Agreste, MD   Travis Huynh  Cardiologist:  Travis Bergeron, MD  Advanced Practice Provider:  No care team member to display Electrophysiologist:  None   HPI    Travis Huynh is a 56 y.o. male  with a past medical history significant for HTN, anxiety presents today for follow-up.  Patient was originally seen 10/2020 for palpitations.  Cardiac monitor at that time with brief runs of nonsustained SVT with longest lasting 13 beats.  Was seen 01/2021 where he continued to have palpitations in the setting of stress and missing doses of his metoprolol.  During his visit with Travis Dopp, PA on 05/2021 his palpitations were well controlled.  Continued to have some left arm pain/heaviness that was nonexertional.  Family history is notable for mother with CAD at age 18.  No known arrhythmias or SCD and family.  He was last seen 11/2021 and was doing well.  Palpitations had improved.  He did endorse a burning sensation along his left arm and was given gabapentin by neurology.  Sensation starts as a pressure in his shoulder and travels to his elbow.  The burning continues from elbow to his fingers.  The medicine improves the pain for about 8 hours but eventually wears off.  He takes gabapentin twice a day.  At work he was doing a lot of walking and staying on his feet all day.  He was cutting out snacking throughout the night.  He was open to starting a statin for better cholesterol control.  Today, he feels pretty good today.  Denies chest pain or shortness of breath.  He is still smoking but is down to 1 cigarette a day.  His cholesterol was recently checked back in May and his numbers look good on the Crestor 10 mg daily.  He is tolerating his medications without any issues.  He is no longer having any palpitations since he started the Lopressor.  His blood pressure is well  controlled today.  He does state that he has some big toe pain and has a history of gout.  He is currently taking colchicine and plans to reach out to his primary for refills.  Arm pain has improved with the addition of gabapentin.  Reports no shortness of breath nor dyspnea on exertion. Reports no chest pain, pressure, or tightness. No edema, orthopnea, PND. Reports no palpitations.    Past Medical History    Past Medical History:  Diagnosis Date   Anxiety    CKD (chronic kidney disease), stage II    Hyperlipidemia    past hx- controlled   Hypertension    Palpitations    Past Surgical History:  Procedure Laterality Date   COLONOSCOPY     MOUTH SURGERY     removed teeth   POLYPECTOMY      Allergies  No Known Allergies    EKGs/Labs/Other Studies Reviewed:   The following studies were reviewed today: ETT July 09, 2021:   No ST deviation was noted.   Prior study not available for comparison.   Cardiac monitor 11/2020: Patch wear time was 6 days and 13 hours. Predominant rhythm was NSR with average HR 59bpm; ranging from 40bpm-132bpm There were 2 runs of SVT with fastest/longest interval 13 beats with max rate of 132bpm Rare SVE <1%, rare PVCs <1% No afib, VT, or significant pauses Patient triggered event correlates with NSR.  Patch Wear Time:  6 days and 13 hours (2022-03-03T19:07:46-0500 to 2022-03-10T09:06:11-498)   Patient had a min HR of 40 bpm, max HR of 132 bpm, and avg HR of 59 bpm. Predominant underlying rhythm was Sinus Rhythm. 2 Supraventricular Tachycardia runs occurred, the run with the fastest interval lasting 13 beats with a max rate of 132 bpm (avg 122  bpm); the run with the fastest interval was also the longest. Isolated SVEs were rare (<1.0%), SVE Couplets were rare (<1.0%), and SVE Triplets were rare (<1.0%). Isolated VEs were rare (<1.0%), and no VE Couplets or VE Triplets were present.    Negative adequate stress test without evidence of ischemia at  given workload.    EKG:  EKG is not ordered today.   Recent Labs: 11/04/2021: Hemoglobin 12.5; Platelets 214 01/29/2022: ALT 20; BUN 10; Creatinine, Ser 0.97; Potassium 4.4; Sodium 141  Recent Lipid Panel    Component Value Date/Time   CHOL 152 01/19/2022 1001   TRIG 131 01/19/2022 1001   HDL 40 01/19/2022 1001   CHOLHDL 3.8 01/19/2022 1001   CHOLHDL 5 08/06/2021 0928   VLDL 19.6 08/06/2021 0928   LDLCALC 89 01/19/2022 1001   Home Medications   Current Meds  Medication Sig   amLODipine (NORVASC) 10 MG tablet Take 1 tablet (10 mg total) by mouth daily.   colchicine 0.6 MG tablet 1.'2mg'$  once, then additional 0.'6mg'$  1 hour later if needed per attack.   gabapentin (NEURONTIN) 300 MG capsule TAKE 1 CAPSULE ('300MG'$ ) IN THE MORNING AND  1 CAPSULE ('300MG'$ ) IN THE EVENING.   metoprolol tartrate (LOPRESSOR) 25 MG tablet Take 1 tablet (25 mg total) by mouth 2 (two) times daily.   predniSONE (DELTASONE) 50 MG tablet Take 1 tablet (50 mg total) by mouth daily with breakfast.   [DISCONTINUED] rosuvastatin (CRESTOR) 10 MG tablet Take 1 tablet (10 mg total) by mouth daily.     Review of Systems      All other systems reviewed and are otherwise negative except as noted above.  Physical Exam    VS:  BP 124/80   Pulse 63   Ht '5\' 7"'$  (1.702 m)   Wt 161 lb (73 kg)   SpO2 98%   BMI 25.22 kg/m  , BMI Body mass index is 25.22 kg/m.  Wt Readings from Last 3 Encounters:  06/05/22 161 lb (73 kg)  01/29/22 164 lb 12.8 oz (74.8 kg)  12/01/21 163 lb 12.8 oz (74.3 kg)     GEN: Well nourished, well developed, in no acute distress. HEENT: normal. Neck: Supple, no JVD, carotid bruits, or masses. Cardiac: RRR, no murmurs, rubs, or gallops. No clubbing, cyanosis, edema.  Radials/PT 2+ and equal bilaterally.  Respiratory:  Respirations regular and unlabored, clear to auscultation bilaterally. GI: Soft, nontender, nondistended. MS: No deformity or atrophy. Skin: Warm and dry, no rash. Neuro:  Strength  and sensation are intact. Psych: Normal affect.  Assessment & Plan    Palpitations -no issues  -Continue metoprolol tartrate 25 mg twice a day  Mixed hyperlipidemia -He was started on Crestor 10 mg daily back in March. -Most recent lipid panel 01/2022 showed LDL 89, HDL 40, total cholesterol 152, triglycerides 131 -Repeat lipid panel at next appointment  Family history of cardiovascular disease -Risk factor modification with daily exercise, continue current medication regimen with Norvasc 10 mg daily, Lopressor 25 mg twice a day, and Crestor 10 mg daily  Left arm pain -Improved with gabapentin, continue  Tobacco abuse -cutting back, now  smoking one a day -Offered nicotine patches, patient declined at this time  Essential hypertension -Well-controlled today on exam -Continue Norvasc 10 mg daily and Lopressor 25 mg twice a day  Gout flare -He is on colchicine 0.6 mg twice a day and plans to reach out to his primary care for refills         Disposition: Follow up 6 months with Travis Bergeron, MD or APP.  Signed, Elgie Collard, PA-C 06/05/2022, 9:20 AM [facility]

## 2022-06-05 NOTE — Patient Instructions (Signed)
Medication Instructions:  Your physician recommends that you continue on your current medications as directed. Please refer to the Current Medication list given to you today.  *If you need a refill on your cardiac medications before your next appointment, please call your pharmacy*  Lab Work: None ordered If you have labs (blood work) drawn today and your tests are completely normal, you will receive your results only by: [place] (if you have MyChart) OR A paper copy in the mail If you have any lab test that is abnormal or we need to change your treatment, we will call you to review the results.  Follow-Up: At [facility], you and your health needs are our priority.  As part of our continuing mission to provide you with exceptional heart care, we have created designated Provider Care Teams.  These Care Teams include your primary Cardiologist (physician) and Advanced Practice Providers (APPs -  Physician Assistants and Nurse Practitioners) who all work together to provide you with the care you need, when you need it.  Your next appointment:   6 month(s)  The format for your next appointment:   In Person  Provider:   Freada Bergeron, MD    Important Information About Sugar

## 2022-06-05 NOTE — Telephone Encounter (Signed)
Refilled called to inform pt of this no answer LM

## 2022-06-05 NOTE — Telephone Encounter (Signed)
Encourage patient to contact the pharmacy for refills or they can request refills through [facility]  (Please schedule appointment if patient has not been seen in over a year)    WHAT [place] THIS SENT TO: [place] NAME & DOSE: colchicine 0.6 mg   NOTES/COMMENTS FROM PATIENT: pt needs a refill on medication.      [place] office please notify patient: It takes 48-72 hours to process rx refill requests Ask patient to call pharmacy to ensure rx is ready before heading there.

## 2022-06-18 ENCOUNTER — Other Ambulatory Visit (HOSPITAL_COMMUNITY): Payer: Self-pay

## 2022-06-22 ENCOUNTER — Other Ambulatory Visit: Payer: Self-pay

## 2022-06-22 ENCOUNTER — Other Ambulatory Visit (HOSPITAL_COMMUNITY): Payer: Self-pay

## 2022-06-22 ENCOUNTER — Telehealth: Payer: Self-pay | Admitting: Family Medicine

## 2022-06-22 ENCOUNTER — Other Ambulatory Visit: Payer: Self-pay | Admitting: Family Medicine

## 2022-06-22 DIAGNOSIS — M109 Gout, unspecified: Secondary | ICD-10-CM

## 2022-06-22 MED ORDER — COLCHICINE 0.6 MG PO TABS
ORAL_TABLET | ORAL | 0 refills | Status: DC
  Filled 2022-06-22: qty 12, 4d supply, fill #0

## 2022-06-22 MED ORDER — COLCHICINE 0.6 MG PO TABS
ORAL_TABLET | ORAL | 0 refills | Status: DC
  Filled 2022-06-22: qty 12, fill #0

## 2022-06-22 NOTE — Telephone Encounter (Signed)
Encourage patient to contact the pharmacy for refills or they can request refills through [facility]  (Please schedule appointment if patient has not been seen in over a year)    WHAT [place] TO: [facility]   MEDICATION NAME & DOSE: Colchicine 0.6 mg   NOTES/COMMENTS FROM PATIENT:      [place] office please notify patient: It takes 48-72 hours to process rx refill requests Ask patient to call pharmacy to ensure rx is ready before heading there.

## 2022-06-22 NOTE — Telephone Encounter (Signed)
Sent, called and informed pt

## 2022-07-16 ENCOUNTER — Other Ambulatory Visit (HOSPITAL_COMMUNITY): Payer: Self-pay

## 2022-07-16 ENCOUNTER — Other Ambulatory Visit: Payer: Self-pay

## 2022-07-16 DIAGNOSIS — M109 Gout, unspecified: Secondary | ICD-10-CM

## 2022-07-16 MED ORDER — COLCHICINE 0.6 MG PO TABS
ORAL_TABLET | ORAL | 0 refills | Status: DC
  Filled 2022-07-16: qty 12, 4d supply, fill #0

## 2022-07-16 NOTE — Progress Notes (Signed)
Patient called and requested refill on colchicine to [place] outpatient pharmacy patient has follow up in December will keep that appt, Rx refilled

## 2022-07-23 ENCOUNTER — Other Ambulatory Visit (HOSPITAL_COMMUNITY): Payer: Self-pay

## 2022-07-23 ENCOUNTER — Other Ambulatory Visit: Payer: Self-pay

## 2022-07-24 ENCOUNTER — Other Ambulatory Visit (HOSPITAL_COMMUNITY): Payer: Self-pay

## 2022-08-07 ENCOUNTER — Ambulatory Visit (INDEPENDENT_AMBULATORY_CARE_PROVIDER_SITE_OTHER): Payer: 59 | Admitting: Family Medicine

## 2022-08-07 ENCOUNTER — Other Ambulatory Visit (HOSPITAL_COMMUNITY): Payer: Self-pay

## 2022-08-07 ENCOUNTER — Encounter: Payer: Self-pay | Admitting: Family Medicine

## 2022-08-07 VITALS — BP 118/68 | HR 54 | Temp 98.4°F | Ht 67.0 in | Wt 156.4 lb

## 2022-08-07 DIAGNOSIS — Z0000 Encounter for general adult medical examination without abnormal findings: Secondary | ICD-10-CM

## 2022-08-07 DIAGNOSIS — Z79899 Other long term (current) drug therapy: Secondary | ICD-10-CM

## 2022-08-07 DIAGNOSIS — E782 Mixed hyperlipidemia: Secondary | ICD-10-CM

## 2022-08-07 DIAGNOSIS — Z122 Encounter for screening for malignant neoplasm of respiratory organs: Secondary | ICD-10-CM

## 2022-08-07 DIAGNOSIS — Z8249 Family history of ischemic heart disease and other diseases of the circulatory system: Secondary | ICD-10-CM

## 2022-08-07 DIAGNOSIS — Z8739 Personal history of other diseases of the musculoskeletal system and connective tissue: Secondary | ICD-10-CM

## 2022-08-07 DIAGNOSIS — F17210 Nicotine dependence, cigarettes, uncomplicated: Secondary | ICD-10-CM

## 2022-08-07 DIAGNOSIS — E785 Hyperlipidemia, unspecified: Secondary | ICD-10-CM

## 2022-08-07 DIAGNOSIS — R6882 Decreased libido: Secondary | ICD-10-CM

## 2022-08-07 DIAGNOSIS — Z125 Encounter for screening for malignant neoplasm of prostate: Secondary | ICD-10-CM

## 2022-08-07 DIAGNOSIS — I10 Essential (primary) hypertension: Secondary | ICD-10-CM

## 2022-08-07 DIAGNOSIS — R7303 Prediabetes: Secondary | ICD-10-CM | POA: Diagnosis not present

## 2022-08-07 DIAGNOSIS — N529 Male erectile dysfunction, unspecified: Secondary | ICD-10-CM

## 2022-08-07 LAB — COMPREHENSIVE METABOLIC PANEL
ALT: 34 U/L (ref 0–53)
AST: 24 U/L (ref 0–37)
Albumin: 4.4 g/dL (ref 3.5–5.2)
Alkaline Phosphatase: 85 U/L (ref 39–117)
BUN: 13 mg/dL (ref 6–23)
CO2: 27 mEq/L (ref 19–32)
Calcium: 9.2 mg/dL (ref 8.4–10.5)
Chloride: 106 mEq/L (ref 96–112)
Creatinine, Ser: 1 mg/dL (ref 0.40–1.50)
GFR: 83.82 mL/min (ref >60.00)
Glucose, Bld: 90 mg/dL (ref 70–99)
Potassium: 4.7 mEq/L (ref 3.5–5.1)
Sodium: 141 mEq/L (ref 135–145)
Total Bilirubin: 0.5 mg/dL (ref 0.2–1.2)
Total Protein: 7.4 g/dL (ref 6.0–8.3)

## 2022-08-07 LAB — LIPID PANEL
Cholesterol: 136 mg/dL (ref 0–200)
HDL: 36.2 mg/dL — ABNORMAL LOW (ref >39.00)
LDL Cholesterol: 86 mg/dL (ref 0–99)
NonHDL: 99.8
Total CHOL/HDL Ratio: 4
Triglycerides: 69 mg/dL (ref 0.0–149.0)
VLDL: 13.8 mg/dL (ref 0.0–40.0)

## 2022-08-07 LAB — HEMOGLOBIN A1C: Hgb A1c MFr Bld: 6.5 % (ref 4.6–6.5)

## 2022-08-07 LAB — URIC ACID: Uric Acid, Serum: 8.6 mg/dL — ABNORMAL HIGH (ref 4.0–7.8)

## 2022-08-07 LAB — PSA: PSA: 1.45 ng/mL (ref 0.10–4.00)

## 2022-08-07 LAB — TESTOSTERONE: Testosterone: 236.43 ng/dL — ABNORMAL LOW (ref 300.00–890.00)

## 2022-08-07 MED ORDER — AMLODIPINE BESYLATE 10 MG PO TABS
10.0000 mg | ORAL_TABLET | Freq: Every day | ORAL | 1 refills | Status: DC
  Filled 2022-08-07 – 2022-10-09 (×2): qty 90, 90d supply, fill #0

## 2022-08-07 MED ORDER — SILDENAFIL CITRATE 100 MG PO TABS
50.0000 mg | ORAL_TABLET | Freq: Every day | ORAL | 11 refills | Status: AC | PRN
  Filled 2022-08-07: qty 5, 25d supply, fill #0

## 2022-08-07 MED ORDER — ROSUVASTATIN CALCIUM 10 MG PO TABS
10.0000 mg | ORAL_TABLET | Freq: Every day | ORAL | 1 refills | Status: DC
  Filled 2022-08-07 – 2022-10-09 (×2): qty 90, 90d supply, fill #0

## 2022-08-07 MED ORDER — METOPROLOL TARTRATE 25 MG PO TABS
25.0000 mg | ORAL_TABLET | Freq: Two times a day (BID) | ORAL | 1 refills | Status: DC
  Filled 2022-08-07: qty 180, 90d supply, fill #0

## 2022-08-07 MED ORDER — COLCHICINE 0.6 MG PO TABS
ORAL_TABLET | ORAL | 0 refills | Status: DC
  Filled 2022-08-07: qty 12, 4d supply, fill #0

## 2022-08-07 NOTE — Patient Instructions (Addendum)
Keep up the good work with quitting smoking. See info below on managing challenges of quitting smoking. Viagra prescription given, use lowest effective dose, be seen right away if any vision or hearing changes or chest pain with exertion.  Recheck in 1 month.  I will check testosterone level today but that will need to be rechecked if low. No other med changes today.  I will refer you for lung cancer screening.  Please schedule visit with eye doctor and dentist. Take care!    Preventive Care 71-46 Years Old, Male Preventive care refers to lifestyle choices and visits with your health care provider that can promote health and wellness. Preventive care visits are also called wellness exams. What can I expect for my preventive care visit? Counseling During your preventive care visit, your health care provider may ask about your: Medical history, including: Past medical problems. Family medical history. Current health, including: Emotional well-being. Home life and relationship well-being. Sexual activity. Lifestyle, including: Alcohol, nicotine or tobacco, and drug use. Access to firearms. Diet, exercise, and sleep habits. Safety issues such as seatbelt and bike helmet use. Sunscreen use. Work and work Statistician. Physical exam Your health care provider will check your: Height and weight. These may be used to calculate your BMI (body mass index). BMI is a measurement that tells if you are at a healthy weight. Waist circumference. This measures the distance around your waistline. This measurement also tells if you are at a healthy weight and may help predict your risk of certain diseases, such as type 2 diabetes and high blood pressure. Heart rate and blood pressure. Body temperature. Skin for abnormal spots. What immunizations do I need?  Vaccines are usually given at various ages, according to a schedule. Your health care provider will recommend vaccines for you based on your age,  medical history, and lifestyle or other factors, such as travel or where you work. What tests do I need? Screening Your health care provider may recommend screening tests for certain conditions. This may include: Lipid and cholesterol levels. Diabetes screening. This is done by checking your blood sugar (glucose) after you have not eaten for a while (fasting). Hepatitis B test. Hepatitis C test. HIV (human immunodeficiency virus) test. STI (sexually transmitted infection) testing, if you are at risk. Lung cancer screening. Prostate cancer screening. Colorectal cancer screening. Talk with your health care provider about your test results, treatment options, and if necessary, the need for more tests. Follow these instructions at home: Eating and drinking  Eat a diet that includes fresh fruits and vegetables, whole grains, lean protein, and low-fat dairy products. Take vitamin and mineral supplements as recommended by your health care provider. Do not drink alcohol if your health care provider tells you not to drink. If you drink alcohol: Limit how much you have to 0-2 drinks a day. Know how much alcohol is in your drink. In the [place] one drink equals one 12 oz bottle of beer (355 mL), one 5 oz glass of wine (148 mL), or one 1 oz glass of hard liquor (44 mL). Lifestyle Brush your teeth every morning and night with fluoride toothpaste. Floss one time each day. Exercise for at least 30 minutes 5 or more days each week. Do not use any products that contain nicotine or tobacco. These products include cigarettes, chewing tobacco, and vaping devices, such as e-cigarettes. If you need help quitting, ask your health care provider. Do not use drugs. If you are sexually active, practice safe sex. Use  a condom or other form of protection to prevent STIs. Take aspirin only as told by your health care provider. Make sure that you understand how much to take and what form to take. Work with your health  care provider to find out whether it is safe and beneficial for you to take aspirin daily. Find healthy ways to manage stress, such as: Meditation, yoga, or listening to music. Journaling. Talking to a trusted person. Spending time with friends and family. Minimize exposure to UV radiation to reduce your risk of skin cancer. Safety Always wear your seat belt while driving or riding in a vehicle. Do not drive: If you have been drinking alcohol. Do not ride with someone who has been drinking. When you are tired or distracted. While texting. If you have been using any mind-altering substances or drugs. Wear a helmet and other protective equipment during sports activities. If you have firearms in your house, make sure you follow all gun safety procedures. What's next? Go to your health care provider once a year for an annual wellness visit. Ask your health care provider how often you should have your eyes and teeth checked. Stay up to date on all vaccines. This information is not intended to replace advice given to you by your health care provider. Make sure you discuss any questions you have with your health care provider. Document Revised: 02/19/2021 Document Reviewed: 02/19/2021 Elsevier Patient Education  [employer] of Quitting Smoking Quitting smoking is a physical and mental challenge. You may have cravings, withdrawal symptoms, and temptation to smoke. Before quitting, work with your health care provider to make a plan that can help you manage quitting. Making a plan before you quit may keep you from smoking when you have the urge to smoke while trying to quit. How to manage lifestyle changes Managing stress Stress can make you want to smoke, and wanting to smoke may cause stress. It is important to find ways to manage your stress. You could try some of the following: Practice relaxation techniques. Breathe slowly and deeply, in through your nose  and out through your mouth. Listen to music. Soak in a bath or take a shower. Imagine a peaceful place or vacation. Get some support. Talk with family or friends about your stress. Join a support group. Talk with a counselor or therapist. Get some physical activity. Go for a walk, run, or bike ride. Play a favorite sport. Practice yoga.  Medicines Talk with your health care provider about medicines that might help you deal with cravings and make quitting easier for you. Relationships Social situations can be difficult when you are quitting smoking. To manage this, you can: Avoid parties and other social situations where people might be smoking. Avoid alcohol. Leave right away if you have the urge to smoke. Explain to your family and friends that you are quitting smoking. Ask for support and let them know you might be a bit grumpy. Plan activities where smoking is not an option. General instructions Be aware that many people gain weight after they quit smoking. However, not everyone does. To keep from gaining weight, have a plan in place before you quit, and stick to the plan after you quit. Your plan should include: Eating healthy snacks. When you have a craving, it may help to: Eat popcorn, or try carrots, celery, or other cut vegetables. Chew sugar-free gum. Changing how you eat. Eat small portion sizes at meals. Eat 4-6 small  meals throughout the day instead of 1-2 large meals a day. Be mindful when you eat. You should avoid watching television or doing other things that might distract you as you eat. Exercising regularly. Make time to exercise each day. If you do not have time for a long workout, do short bouts of exercise for 5-10 minutes several times a day. Do some form of strengthening exercise, such as weight lifting. Do some exercise that gets your heart beating and causes you to breathe deeply, such as walking fast, running, swimming, or biking. This is very  important. Drinking plenty of water or other low-calorie or no-calorie drinks. Drink enough fluid to keep your urine pale yellow.  How to recognize withdrawal symptoms Your body and mind may experience discomfort as you try to get used to not having nicotine in your system. These effects are called withdrawal symptoms. They may include: Feeling hungrier than normal. Having trouble concentrating. Feeling irritable or restless. Having trouble sleeping. Feeling depressed. Craving a cigarette. These symptoms may surprise you, but they are normal to have when quitting smoking. To manage withdrawal symptoms: Avoid places, people, and activities that trigger your cravings. Remember why you want to quit. Get plenty of sleep. Avoid coffee and other drinks that contain caffeine. These may worsen some of your symptoms. How to manage cravings Come up with a plan for how to deal with your cravings. The plan should include the following: A definition of the specific situation you want to deal with. An activity or action you will take to replace smoking. A clear idea for how this action will help. The name of someone who could help you with this. Cravings usually last for 5-10 minutes. Consider taking the following actions to help you with your plan to deal with cravings: Keep your mouth busy. Chew sugar-free gum. Suck on hard candies or a straw. Brush your teeth. Keep your hands and body busy. Change to a different activity right away. Squeeze or play with a ball. Do an activity or a hobby, such as making bead jewelry, practicing needlepoint, or working with wood. Mix up your normal routine. Take a short exercise break. Go for a quick walk, or run up and down stairs. Focus on doing something kind or helpful for someone else. Call a friend or family member to talk during a craving. Join a support group. Contact a quitline. Where to find support To get help or find a support group: Call the  [facility]'s Smoking Quitline: 1-800-QUIT-NOW 586-645-6433) Text QUIT to SmokefreeTXT: 725366 Where to find more information Visit these websites to find more information on quitting smoking: [place] Department of Health and Human Services: www.smokefree.gov American Lung Association: www.freedomfromsmoking.org Centers for Disease Control and Prevention (CDC): http://www.wolf.info/ American Heart Association: www.heart.org Contact a health care provider if: You want to change your plan for quitting. The medicines you are taking are not helping. Your eating feels out of control or you cannot sleep. You feel depressed or become very anxious. Summary Quitting smoking is a physical and mental challenge. You will face cravings, withdrawal symptoms, and temptation to smoke again. Preparation can help you as you go through these challenges. Try different techniques to manage stress, handle social situations, and prevent weight gain. You can deal with cravings by keeping your mouth busy (such as by chewing gum), keeping your hands and body busy, calling family or friends, or contacting a quitline for people who want to quit smoking. You can deal with withdrawal symptoms by  avoiding places where people smoke, getting plenty of rest, and avoiding drinks that contain caffeine. This information is not intended to replace advice given to you by your health care provider. Make sure you discuss any questions you have with your health care provider. Document Revised: 08/15/2021 Document Reviewed: 08/15/2021 Elsevier Patient Education  [place].

## 2022-08-07 NOTE — Progress Notes (Signed)
Subjective:  Patient ID: Travis Huynh, male    DOB: 06/27/1966  Age: 56 y.o. MRN: [id number]  CC:  Chief Complaint  Patient presents with   Annual Exam    Pt is fasting     HPI Travis Huynh presents for Annual Exam  PCP, [facility], Dr. Johney Frame, hypertension, hyperlipidemia, palpitations, evaluation of March.  Monitor with brief runs of nonsustained SVT.  Controlled on metoprolol 25 mg twice daily with extra dose if needed.no recent extra doses. No recent palpitations.  Neurology, [facility] clinic, Dr. Melrose Nakayama, Luella Cook.  Elbow pain, dysesthesias.  Treated with gabapentin 300 mg twice daily at his July visit.  EMG results were negative.  47-monthfollow-up.  Still taking BID. Doing ok.   Gout: Last flare:none recent, every few months.  Daily meds:none Prn med: colchicine.  Lab Results  Component Value Date   LABURIC 8.1 (H) 01/29/2022   Hypertension: Amlodipine 10 mg daily, Toprol 25 mg twice daily Home readings:none. No med side effects.  BP Readings from Last 3 Encounters:  08/07/22 118/68  06/05/22 124/80  01/29/22 132/78   Lab Results  Component Value Date   CREATININE 0.97 01/29/2022   Prediabetes: No meds. Weight improved. No diet/exercise changes.  Lab Results  Component Value Date   HGBA1C 6.4 01/29/2022   Wt Readings from Last 3 Encounters:  08/07/22 156 lb 6.4 oz (70.9 kg)  06/05/22 161 lb (73 kg)  01/29/22 164 lb 12.8 oz (74.8 kg)    Tobacco abuse Still smoking, cut back - 1 cigarette per day after work.  Smoker - about 1ppd for 32-33 years.  4 min conversation, also discussed lung CA screening - agrees.   Hyperlipidemia: Crestor started in March.  10 mg daily.  Labs in May as below. Lab Results  Component Value Date   CHOL 152 01/19/2022   HDL 40 01/19/2022   LDLCALC 89 01/19/2022   TRIG 131 01/19/2022   CHOLHDL 3.8 01/19/2022   Lab Results  Component Value Date   ALT 20 01/29/2022   AST 18 01/29/2022   ALKPHOS 81 01/29/2022    BILITOT 0.6 01/29/2022     Decreased sex drive past 2 years. Relationship doing ok. Both difficulty with erection and sex drive. Does have morning erections. No prior ED meds. No CP with exertion.       08/07/2022    7:47 AM 01/29/2022    8:50 AM 08/06/2021    8:54 AM 05/01/2021   10:11 AM 01/27/2021   10:06 AM  Depression screen PHQ 2/9  Decreased Interest 0 0 0 0 0  Down, Depressed, Hopeless 0 0 0 0 0  PHQ - 2 Score 0 0 0 0 0  Altered sleeping 0    0  Tired, decreased energy 0    0  Change in appetite 0    0  Feeling bad or failure about yourself  0    0  Trouble concentrating 0    0  Moving slowly or fidgety/restless 0    0  Suicidal thoughts 0    0  PHQ-9 Score 0    0    Health Maintenance  Topic Date Due   COVID-19 Vaccine (4 - 2023-24 season) 08/23/2022 (Originally 05/08/2022)   INFLUENZA VACCINE  12/06/2022 (Originally 04/07/2022)   COLONOSCOPY (Pts [id number] coverage will need to be confirmed)  07/10/2028   DTaP/Tdap/Td (2 - Td or Tdap) 02/20/2030   Hepatitis C Screening  Completed   HIV Screening  Completed  Zoster Vaccines- Shingrix  Completed   HPV VACCINES  Aged Out  Colonoscopy 07/10/2021, Dr. Loletha Carrow.  Diverticulosis, repeat 7 years. Prostate: does  have family history of prostate cancer - uncle The natural history of prostate cancer and ongoing controversy regarding screening and potential treatment outcomes of prostate cancer has been discussed with the patient. The meaning of a false positive PSA and a false negative PSA has been discussed. He indicates understanding of the limitations of this screening test and wishes  to proceed with screening PSA testing. Lab Results  Component Value Date   PSA 1.13 01/27/2021  Lung CA screening as above.    Immunization History  Administered Date(s) Administered   PFIZER(Purple Top)SARS-COV-2 Vaccination 08/29/2019, 09/19/2019, 06/06/2020   Tdap 02/21/2020   Zoster Recombinat (Shingrix) 01/27/2021, 05/01/2021   Flu vaccine at [place] booster at [facility] 2 weeks ago.   No results found. Optho - none. Wears glasses.   Dental:none.   Alcohol: none  Tobacco: as above.   Exercise: physical work.    History Patient Active Problem List   Diagnosis Date Noted   Left knee pain 03/10/2021   Hypertension    Tobacco abuse    Acute renal failure superimposed on stage 2 chronic kidney disease (HCC)    Past Medical History:  Diagnosis Date   Anxiety    CKD (chronic kidney disease), stage II    Hyperlipidemia    past hx- controlled   Hypertension    Palpitations    Past Surgical History:  Procedure Laterality Date   COLONOSCOPY     MOUTH SURGERY     removed teeth   POLYPECTOMY     No Known Allergies Prior to Admission medications   Medication Sig Start Date End Date Taking? Authorizing Provider  amLODipine (NORVASC) 10 MG tablet Take 1 tablet (10 mg total) by mouth daily. 01/29/22 09/20/22 Yes Wendie Agreste, MD  colchicine 0.6 MG tablet Take 2 tablets (1.'2mg'$ ) at once, then take 1 tablet (0.'6mg'$ ) 1 hour later if needed for attack. 07/16/22  Yes Wendie Agreste, MD  gabapentin (NEURONTIN) 300 MG capsule TAKE 1 CAPSULE ('300MG'$ ) BY MOUTH IN THE MORNING AND 1 CAPSULE ('300MG'$ ) IN THE EVENING. 03/17/22  Yes   metoprolol tartrate (LOPRESSOR) 25 MG tablet Take 1 tablet (25 mg total) by mouth 2 (two) times daily. 03/26/22  Yes Wendie Agreste, MD  predniSONE (DELTASONE) 50 MG tablet Take 1 tablet (50 mg total) by mouth daily with breakfast. 11/04/21  Yes Lavonia Drafts, MD  rosuvastatin (CRESTOR) 10 MG tablet Take 1 tablet (10 mg total) by mouth daily. 06/05/22  Yes Elgie Collard, PA-C   Social History   Socioeconomic History   Marital status: Married    Spouse name: Not on file   Number of children: Not on file   Years of education: Not on file   Highest education level: Not on file  Occupational History   Not on file  Tobacco Use   Smoking status: Every Day    Packs/day:  0.25    Types: Cigarettes   Smokeless tobacco: Never   Tobacco comments:    pt smokes about 2 cigarettes a day   Vaping Use   Vaping Use: Never used  Substance and Sexual Activity   Alcohol use: Yes    Alcohol/week: 1.0 standard drink of alcohol    Types: 1 Cans of beer per week    Comment: occasionally   Drug use: Never   Sexual activity: Yes  Other Topics Concern   Not on file  Social History Narrative   Not on file   Social Determinants of Health   Financial Resource Strain: Not on file  Food Insecurity: Not on file  Transportation Needs: Not on file  Physical Activity: Not on file  Stress: Not on file  Social Connections: Not on file  Intimate Partner Violence: Not on file    Review of Systems 13 point review of systems per patient health survey noted.  Negative other than as indicated above or in HPI.    Objective:   Vitals:   08/07/22 0753  BP: 118/68  Pulse: (!) 54  Temp: 98.4 F (36.9 C)  TempSrc: Oral  SpO2: 99%  Weight: 156 lb 6.4 oz (70.9 kg)  Height: '5\' 7"'$  (1.702 m)     Physical Exam Vitals reviewed.  Constitutional:      Appearance: He is well-developed.  HENT:     Head: Normocephalic and atraumatic.     Right Ear: External ear normal.     Left Ear: External ear normal.  Eyes:     Conjunctiva/sclera: Conjunctivae normal.     Pupils: Pupils are equal, round, and reactive to light.  Neck:     Thyroid: No thyromegaly.  Cardiovascular:     Rate and Rhythm: Normal rate and regular rhythm.     Heart sounds: Normal heart sounds.  Pulmonary:     Effort: Pulmonary effort is normal. No respiratory distress.     Breath sounds: Normal breath sounds. No wheezing.  Abdominal:     General: There is no distension.     Palpations: Abdomen is soft.     Tenderness: There is no abdominal tenderness.  Musculoskeletal:        General: No tenderness. Normal range of motion.     Cervical back: Normal range of motion and neck supple.  Lymphadenopathy:      Cervical: No cervical adenopathy.  Skin:    General: Skin is warm and dry.  Neurological:     Mental Status: He is alert and oriented to person, place, and time.     Deep Tendon Reflexes: Reflexes are normal and symmetric.  Psychiatric:        Behavior: Behavior normal.        Assessment & Plan:  Travis Huynh is a 56 y.o. male . Annual physical exam  - -anticipatory guidance as below in AVS, screening labs above. Health maintenance items as above in HPI discussed/recommended as applicable.  Dentist, Optho recommended.  Essential hypertension - Plan: Comprehensive metabolic panel, metoprolol tartrate (LOPRESSOR) 25 MG tablet, amLODipine (NORVASC) 10 MG tablet  -  Stable, tolerating current regimen. Medications refilled. Labs pending as above.   Prediabetes - Plan: Hemoglobin A1c Check updated A1c.  Watch diet, activity levels.  Screening for prostate cancer - Plan: PSA  History of gout - Plan: Uric acid, colchicine 0.6 MG tablet  -Check uric acid, consider allopurinol if elevated.  Colchicine if needed for flares.  RTC precautions  Mixed hyperlipidemia - Plan: rosuvastatin (CRESTOR) 10 MG tablet Family history of cardiovascular disease - Plan: rosuvastatin (CRESTOR) 10 MG tablet Hyperlipidemia, unspecified hyperlipidemia type - Plan: Lipid panel  -Tolerating Crestor, check labs. Medication management - Plan: rosuvastatin (CRESTOR) 10 MG tablet  Screening for lung cancer - Plan: Ambulatory Referral Lung Cancer Screening [facility]  Decreased libido - Plan: Testosterone  -Denies relational difficulties, check testosterone initially, repeated flow.  Follow-up in 1 month to discuss further.  Viagra temporarily  as below.  Erectile dysfunction, unspecified erectile dysfunction type - Plan: sildenafil (VIAGRA) 100 MG tablet  -Psychologic versus circulation with underlying tobacco use, hyperlipidemia, hypertension.  Initial trial of Viagra.  viagra Rx given - use lowest  effective dose. Side effects discussed (including but not limited to headache/flushing, blue discoloration of vision, possible vascular steal and risk of cardiac effects if underlying unknown coronary artery disease, and permanent sensorineural hearing loss). Understanding expressed.   Meds ordered this encounter  Medications   metoprolol tartrate (LOPRESSOR) 25 MG tablet    Sig: Take 1 tablet (25 mg total) by mouth 2 (two) times daily.    Dispense:  180 tablet    Refill:  1   amLODipine (NORVASC) 10 MG tablet    Sig: Take 1 tablet (10 mg total) by mouth daily.    Dispense:  90 tablet    Refill:  1   colchicine 0.6 MG tablet    Sig: Take 2 tablets (1.'2mg'$ ) at once, then take 1 tablet (0.'6mg'$ ) 1 hour later if needed for attack.    Dispense:  12 tablet    Refill:  0   rosuvastatin (CRESTOR) 10 MG tablet    Sig: Take 1 tablet (10 mg total) by mouth daily.    Dispense:  90 tablet    Refill:  1   sildenafil (VIAGRA) 100 MG tablet    Sig: Take 0.5-1 tablets (50-100 mg total) by mouth daily as needed for erectile dysfunction.    Dispense:  5 tablet    Refill:  11   Patient Instructions  Keep up the good work with quitting smoking. See info below on managing challenges of quitting smoking. Viagra prescription given, use lowest effective dose, be seen right away if any vision or hearing changes or chest pain with exertion.  Recheck in 1 month.  I will check testosterone level today but that will need to be rechecked if low. No other med changes today.  I will refer you for lung cancer screening.  Please schedule visit with eye doctor and dentist. Take care!    Preventive Care 82-19 Years Old, Male Preventive care refers to lifestyle choices and visits with your health care provider that can promote health and wellness. Preventive care visits are also called wellness exams. What can I expect for my preventive care visit? Counseling During your preventive care visit, your health care  provider may ask about your: Medical history, including: Past medical problems. Family medical history. Current health, including: Emotional well-being. Home life and relationship well-being. Sexual activity. Lifestyle, including: Alcohol, nicotine or tobacco, and drug use. Access to firearms. Diet, exercise, and sleep habits. Safety issues such as seatbelt and bike helmet use. Sunscreen use. Work and work Statistician. Physical exam Your health care provider will check your: Height and weight. These may be used to calculate your BMI (body mass index). BMI is a measurement that tells if you are at a healthy weight. Waist circumference. This measures the distance around your waistline. This measurement also tells if you are at a healthy weight and may help predict your risk of certain diseases, such as type 2 diabetes and high blood pressure. Heart rate and blood pressure. Body temperature. Skin for abnormal spots. What immunizations do I need?  Vaccines are usually given at various ages, according to a schedule. Your health care provider will recommend vaccines for you based on your age, medical history, and lifestyle or other factors, such as travel or where you work. What tests do  I need? Screening Your health care provider may recommend screening tests for certain conditions. This may include: Lipid and cholesterol levels. Diabetes screening. This is done by checking your blood sugar (glucose) after you have not eaten for a while (fasting). Hepatitis B test. Hepatitis C test. HIV (human immunodeficiency virus) test. STI (sexually transmitted infection) testing, if you are at risk. Lung cancer screening. Prostate cancer screening. Colorectal cancer screening. Talk with your health care provider about your test results, treatment options, and if necessary, the need for more tests. Follow these instructions at home: Eating and drinking  Eat a diet that includes fresh fruits  and vegetables, whole grains, lean protein, and low-fat dairy products. Take vitamin and mineral supplements as recommended by your health care provider. Do not drink alcohol if your health care provider tells you not to drink. If you drink alcohol: Limit how much you have to 0-2 drinks a day. Know how much alcohol is in your drink. In the [place] one drink equals one 12 oz bottle of beer (355 mL), one 5 oz glass of wine (148 mL), or one 1 oz glass of hard liquor (44 mL). Lifestyle Brush your teeth every morning and night with fluoride toothpaste. Floss one time each day. Exercise for at least 30 minutes 5 or more days each week. Do not use any products that contain nicotine or tobacco. These products include cigarettes, chewing tobacco, and vaping devices, such as e-cigarettes. If you need help quitting, ask your health care provider. Do not use drugs. If you are sexually active, practice safe sex. Use a condom or other form of protection to prevent STIs. Take aspirin only as told by your health care provider. Make sure that you understand how much to take and what form to take. Work with your health care provider to find out whether it is safe and beneficial for you to take aspirin daily. Find healthy ways to manage stress, such as: Meditation, yoga, or listening to music. Journaling. Talking to a trusted person. Spending time with friends and family. Minimize exposure to UV radiation to reduce your risk of skin cancer. Safety Always wear your seat belt while driving or riding in a vehicle. Do not drive: If you have been drinking alcohol. Do not ride with someone who has been drinking. When you are tired or distracted. While texting. If you have been using any mind-altering substances or drugs. Wear a helmet and other protective equipment during sports activities. If you have firearms in your house, make sure you follow all gun safety procedures. What's next? Go to your health care  provider once a year for an annual wellness visit. Ask your health care provider how often you should have your eyes and teeth checked. Stay up to date on all vaccines. This information is not intended to replace advice given to you by your health care provider. Make sure you discuss any questions you have with your health care provider. Document Revised: 02/19/2021 Document Reviewed: 02/19/2021 Elsevier Patient Education  [place] of Quitting Smoking Quitting smoking is a physical and mental challenge. You may have cravings, withdrawal symptoms, and temptation to smoke. Before quitting, work with your health care provider to make a plan that can help you manage quitting. Making a plan before you quit may keep you from smoking when you have the urge to smoke while trying to quit. How to manage lifestyle changes Managing stress Stress can make you want to smoke,  and wanting to smoke may cause stress. It is important to find ways to manage your stress. You could try some of the following: Practice relaxation techniques. Breathe slowly and deeply, in through your nose and out through your mouth. Listen to music. Soak in a bath or take a shower. Imagine a peaceful place or vacation. Get some support. Talk with family or friends about your stress. Join a support group. Talk with a counselor or therapist. Get some physical activity. Go for a walk, run, or bike ride. Play a favorite sport. Practice yoga.  Medicines Talk with your health care provider about medicines that might help you deal with cravings and make quitting easier for you. Relationships Social situations can be difficult when you are quitting smoking. To manage this, you can: Avoid parties and other social situations where people might be smoking. Avoid alcohol. Leave right away if you have the urge to smoke. Explain to your family and friends that you are quitting smoking. Ask for  support and let them know you might be a bit grumpy. Plan activities where smoking is not an option. General instructions Be aware that many people gain weight after they quit smoking. However, not everyone does. To keep from gaining weight, have a plan in place before you quit, and stick to the plan after you quit. Your plan should include: Eating healthy snacks. When you have a craving, it may help to: Eat popcorn, or try carrots, celery, or other cut vegetables. Chew sugar-free gum. Changing how you eat. Eat small portion sizes at meals. Eat 4-6 small meals throughout the day instead of 1-2 large meals a day. Be mindful when you eat. You should avoid watching television or doing other things that might distract you as you eat. Exercising regularly. Make time to exercise each day. If you do not have time for a long workout, do short bouts of exercise for 5-10 minutes several times a day. Do some form of strengthening exercise, such as weight lifting. Do some exercise that gets your heart beating and causes you to breathe deeply, such as walking fast, running, swimming, or biking. This is very important. Drinking plenty of water or other low-calorie or no-calorie drinks. Drink enough fluid to keep your urine pale yellow.  How to recognize withdrawal symptoms Your body and mind may experience discomfort as you try to get used to not having nicotine in your system. These effects are called withdrawal symptoms. They may include: Feeling hungrier than normal. Having trouble concentrating. Feeling irritable or restless. Having trouble sleeping. Feeling depressed. Craving a cigarette. These symptoms may surprise you, but they are normal to have when quitting smoking. To manage withdrawal symptoms: Avoid places, people, and activities that trigger your cravings. Remember why you want to quit. Get plenty of sleep. Avoid coffee and other drinks that contain caffeine. These may worsen some of  your symptoms. How to manage cravings Come up with a plan for how to deal with your cravings. The plan should include the following: A definition of the specific situation you want to deal with. An activity or action you will take to replace smoking. A clear idea for how this action will help. The name of someone who could help you with this. Cravings usually last for 5-10 minutes. Consider taking the following actions to help you with your plan to deal with cravings: Keep your mouth busy. Chew sugar-free gum. Suck on hard candies or a straw. Brush your teeth. Keep your hands and body  busy. Change to a different activity right away. Squeeze or play with a ball. Do an activity or a hobby, such as making bead jewelry, practicing needlepoint, or working with wood. Mix up your normal routine. Take a short exercise break. Go for a quick walk, or run up and down stairs. Focus on doing something kind or helpful for someone else. Call a friend or family member to talk during a craving. Join a support group. Contact a quitline. Where to find support To get help or find a support group: Call the [facility]'s Smoking Quitline: 1-800-QUIT-NOW (205)324-3166) Text QUIT to SmokefreeTXT: 846659 Where to find more information Visit these websites to find more information on quitting smoking: U.S. Department of Health and Human Services: www.smokefree.gov American Lung Association: www.freedomfromsmoking.org Centers for Disease Control and Prevention (CDC): http://www.wolf.info/ American Heart Association: www.heart.org Contact a health care provider if: You want to change your plan for quitting. The medicines you are taking are not helping. Your eating feels out of control or you cannot sleep. You feel depressed or become very anxious. Summary Quitting smoking is a physical and mental challenge. You will face cravings, withdrawal symptoms, and temptation to smoke again. Preparation can help you  as you go through these challenges. Try different techniques to manage stress, handle social situations, and prevent weight gain. You can deal with cravings by keeping your mouth busy (such as by chewing gum), keeping your hands and body busy, calling family or friends, or contacting a quitline for people who want to quit smoking. You can deal with withdrawal symptoms by avoiding places where people smoke, getting plenty of rest, and avoiding drinks that contain caffeine. This information is not intended to replace advice given to you by your health care provider. Make sure you discuss any questions you have with your health care provider. Document Revised: 08/15/2021 Document Reviewed: 08/15/2021 Elsevier Patient Education  2023 [place],   Merri Ray, MD [place] 08/07/22 8:37 AM

## 2022-08-11 ENCOUNTER — Other Ambulatory Visit (HOSPITAL_COMMUNITY): Payer: Self-pay

## 2022-08-17 ENCOUNTER — Other Ambulatory Visit (HOSPITAL_COMMUNITY): Payer: Self-pay

## 2022-09-10 ENCOUNTER — Ambulatory Visit: Payer: Commercial Managed Care - PPO | Admitting: Family Medicine

## 2022-10-09 ENCOUNTER — Other Ambulatory Visit: Payer: Self-pay | Admitting: Family Medicine

## 2022-10-09 ENCOUNTER — Other Ambulatory Visit (HOSPITAL_COMMUNITY): Payer: Self-pay

## 2022-10-09 DIAGNOSIS — Z8739 Personal history of other diseases of the musculoskeletal system and connective tissue: Secondary | ICD-10-CM

## 2022-10-09 MED ORDER — COLCHICINE 0.6 MG PO TABS
ORAL_TABLET | ORAL | 0 refills | Status: DC
  Filled 2022-10-09: qty 12, 11d supply, fill #0

## 2022-11-08 ENCOUNTER — Emergency Department (HOSPITAL_COMMUNITY): Payer: 59

## 2022-11-08 ENCOUNTER — Encounter (HOSPITAL_COMMUNITY): Payer: Self-pay

## 2022-11-08 ENCOUNTER — Emergency Department (HOSPITAL_COMMUNITY)
Admission: EM | Admit: 2022-11-08 | Discharge: 2022-11-08 | Disposition: A | Discharge status: Home / Self Care | Payer: 59 | Attending: Emergency Medicine | Admitting: Emergency Medicine

## 2022-11-08 ENCOUNTER — Other Ambulatory Visit: Payer: Self-pay

## 2022-11-08 DIAGNOSIS — M5412 Radiculopathy, cervical region: Secondary | ICD-10-CM | POA: Insufficient documentation

## 2022-11-08 DIAGNOSIS — M25522 Pain in left elbow: Secondary | ICD-10-CM | POA: Diagnosis not present

## 2022-11-08 MED ORDER — KETOROLAC TROMETHAMINE 30 MG/ML IJ SOLN
30.0000 mg | Freq: Once | INTRAMUSCULAR | Status: AC
  Administered 2022-11-08: 30 mg via INTRAMUSCULAR
  Filled 2022-11-08: qty 1

## 2022-11-08 MED ORDER — PREDNISONE 10 MG PO TABS
40.0000 mg | ORAL_TABLET | Freq: Every day | ORAL | 0 refills | Status: AC
  Filled 2022-11-08: qty 20, 5d supply, fill #0

## 2022-11-08 NOTE — ED Triage Notes (Signed)
States that his left arm has been hurting for the past couple of days from his shoulder to his fingers. States that his fingers are numb

## 2022-11-08 NOTE — ED Provider Notes (Signed)
 [place] EMERGENCY DEPARTMENT AT [facility] Provider Note   CSN: [id number] Arrival date & time: 11/08/22  2103     History Chief Complaint  Patient presents with   Arm Injury    Travis Huynh is a 57 y.o. male.  Patient presents emergency department with complaints of left elbow pain.  He reports has been somewhat chronic in nature and he feels that he is having some radiation from his left shoulder into his fingertips.  Reports that all of his fingers feel numb periodically.  No prior history or inciting factors.  Attempt to manage symptoms at home with over-the-counter pain medications but not having good symptom control at this time.   Arm Injury      Home Medications Prior to Admission medications   Medication Sig Start Date End Date Taking? Authorizing Provider  predniSONE (DELTASONE) 10 MG tablet Take 4 tablets (40 mg total) by mouth daily for 5 days. 11/08/22 11/14/22 Yes Smitty Knudsen, PA-C  amLODipine (NORVASC) 10 MG tablet Take 1 tablet (10 mg total) by mouth daily. 08/07/22 02/03/23  Shade Flood, MD  colchicine 0.6 MG tablet Take 2 tablets at once, then take 1 tablet 1 hour later if needed for attack. 10/09/22   Shade Flood, MD  gabapentin (NEURONTIN) 300 MG capsule TAKE 1 CAPSULE (300MG ) BY MOUTH IN THE MORNING AND 1 CAPSULE (300MG ) IN THE EVENING. 03/17/22     metoprolol tartrate (LOPRESSOR) 25 MG tablet Take 1 tablet (25 mg total) by mouth 2 (two) times daily. 08/07/22   Shade Flood, MD  rosuvastatin (CRESTOR) 10 MG tablet Take 1 tablet (10 mg total) by mouth daily. 08/07/22   Shade Flood, MD  sildenafil (VIAGRA) 100 MG tablet Take 0.5-1 tablets (50-100 mg total) by mouth daily as needed for erectile dysfunction. 08/07/22   Shade Flood, MD      Allergies    Patient has no known allergies.    Review of Systems   Review of Systems  Neurological:  Positive for numbness.  All other systems reviewed and are negative.   Physical  Exam Updated Vital Signs BP 129/88   Pulse 77   Temp 98.2 F (36.8 C)   Resp 18   Ht 5\' 7"  (1.702 m)   Wt 70.3 kg   SpO2 100%   BMI 24.28 kg/m  Physical Exam Vitals and nursing note reviewed.  Constitutional:      Appearance: Normal appearance.  HENT:     Head: Normocephalic and atraumatic.  Cardiovascular:     Rate and Rhythm: Normal rate and regular rhythm.     Pulses: Normal pulses.  Pulmonary:     Effort: Pulmonary effort is normal.     Breath sounds: Normal breath sounds.  Abdominal:     General: Abdomen is flat.  Musculoskeletal:        General: Tenderness present. No swelling, deformity or signs of injury. Normal range of motion.  Skin:    General: Skin is warm and dry.     Capillary Refill: Capillary refill takes less than 2 seconds.  Neurological:     General: No focal deficit present.     Mental Status: He is alert. Mental status is at baseline.     Sensory: No sensory deficit.     Motor: No weakness.     ED Results / Procedures / Treatments   Labs (all labs ordered are listed, but only abnormal results are displayed) Labs Reviewed - No  data to display  EKG None  Radiology DG Cervical Spine 2-3 Views  Result Date: 11/08/2022 CLINICAL DATA:  Radiculopathy.  No known injury. EXAM: CERVICAL SPINE - 2-3 VIEW COMPARISON:  None Available. FINDINGS: No acute fracture or subluxation of the cervical spine. Multilevel chronic mild compression deformities predominantly involving C3-C6. The visualized posterior elements are intact. The soft tissues are unremarkable. IMPRESSION: 1. No acute findings. 2. Multilevel chronic mild compression deformities. Electronically Signed   By: Elgie Collard M.D.   On: 11/08/2022 21:50   DG Elbow Complete Left  Result Date: 11/08/2022 CLINICAL DATA:  Elbow pain for several days, no known injury, initial encounter EXAM: LEFT ELBOW - COMPLETE 3+ VIEW COMPARISON:  None Available. FINDINGS: Olecranon spurring is noted. No joint  effusion is seen. No acute fracture or dislocation is noted. IMPRESSION: Olecranon spurring without acute bony abnormality. Electronically Signed   By: Alcide Clever M.D.   On: 11/08/2022 21:50    Procedures Procedures   Medications Ordered in ED Medications  ketorolac (TORADOL) 30 MG/ML injection 30 mg (30 mg Intramuscular Given 11/08/22 2244)    ED Course/ Medical Decision Making/ A&P                           Medical Decision Making Amount and/or Complexity of Data Reviewed Radiology: ordered.  Risk Prescription drug management.   This patient presents to the ED for concern of elbow pain.  Differential diagnosis includes elbow fracture, dislocation, cervical radiculopathy  Imaging Studies ordered:  I ordered imaging studies including x-ray of left elbow, x-ray cervical spine I independently visualized and interpreted imaging which showed no acute elbow abnormality, chronic multilevel compression deformities in cervical spine I agree with the radiologist interpretation   Medicines ordered and prescription drug management:  I ordered medication including Toradol for pain I have reviewed the patients home medicines and have made adjustments as needed   Problem List / ED Course:  Patient presents emergency department complaints of left arm pain for the last couple of days.  He was that he is having a sensation of numbness and shooting pain that extends from his left shoulder into his fingers.  History was had symptoms similar to this and was told that this is likely radicular in nature.  Patient has not followed up with any other primary care provider for further evaluation.  Given presentation, x-ray imaging was performed which did show some chronic changes in the cervical spine with some compression deformities noted.  This is possibly accounting for some of the symptoms he is experiencing.  Given that he is not currently experiencing chest pain or shortness of breath, MI, pulm  embolism, pneumonia or less likely.  Advised patient he should manage symptoms at home with over-the-counter medications including Tylenol, ibuprofen, Aleve.  Dose of Toradol given to patient here prior to patient discharge. Prescribed short course of prednisone to take for the next few days to help alleviate more of his elbow discomfort. Patient agreeable with treatment plan and discharge home.  Patient verbalized understanding all return precautions.  All questions answered prior to patient discharge.  Final Clinical Impression(s) / ED Diagnoses Final diagnoses:  Cervical radiculopathy    Rx / DC Orders ED Discharge Orders          Ordered    predniSONE (DELTASONE) 10 MG tablet  Daily        11/08/22 2237  Smitty Knudsen, PA-C 11/10/22 1356    Gloris Manchester, MD 11/11/22 [phone]

## 2022-11-08 NOTE — Discharge Instructions (Signed)
You were seen in the emergency department for left elbow pain and numbness. Your xrays did not show any acute fractures or dislocations, but your xray of the neck did have evidence of some compression of the vertebrae which can cause feelings of arm pain with numbness in the hand. You should manage these symptoms with high dose antiinflammatory medications. I short course of prednisone has been sent to your pharmacy. If you begin to experience worsening numbness, please return to the ER for further evaluation.

## 2022-11-09 ENCOUNTER — Other Ambulatory Visit (HOSPITAL_COMMUNITY): Payer: Self-pay

## 2022-11-09 ENCOUNTER — Telehealth: Payer: Self-pay

## 2022-11-09 NOTE — Telephone Encounter (Signed)
Spoke with patient by phone regarding referral to [facility].  Patient has light smoking history which does not meet the minimum 20 pack years required for LDCT.  Patient began smoking age 57 or 57 years old. States a pack of cigarettes lasts him 4 days, sometimes longer.  At .25 PPD, patient is under 10 pack years.  Advised we would message PCP for alternatives recommendations to LDCT.  Referral is closed.  Patient acknowledged understanding.  Dr. Carlota Raspberry routed this message for [facility]

## 2022-11-09 NOTE — Telephone Encounter (Signed)
Noted, this is different than history I obtained at his last visit but based on this reported amount of smoking, agree with holding off on low-dose CT screening at this time.

## 2022-11-18 ENCOUNTER — Encounter: Payer: Self-pay | Admitting: Family Medicine

## 2022-11-18 ENCOUNTER — Other Ambulatory Visit (HOSPITAL_COMMUNITY): Payer: Self-pay

## 2022-11-18 ENCOUNTER — Ambulatory Visit: Payer: 59 | Admitting: Family Medicine

## 2022-11-18 VITALS — BP 118/62 | HR 60 | Temp 98.7°F | Ht 67.0 in | Wt 158.8 lb

## 2022-11-18 DIAGNOSIS — M5412 Radiculopathy, cervical region: Secondary | ICD-10-CM | POA: Diagnosis not present

## 2022-11-18 DIAGNOSIS — R7303 Prediabetes: Secondary | ICD-10-CM

## 2022-11-18 DIAGNOSIS — M791 Myalgia, unspecified site: Secondary | ICD-10-CM | POA: Diagnosis not present

## 2022-11-18 DIAGNOSIS — M25522 Pain in left elbow: Secondary | ICD-10-CM

## 2022-11-18 LAB — BASIC METABOLIC PANEL
BUN: 19 mg/dL (ref 6–23)
CO2: 26 mEq/L (ref 19–32)
Calcium: 9.5 mg/dL (ref 8.4–10.5)
Chloride: 102 mEq/L (ref 96–112)
Creatinine, Ser: 1.06 mg/dL (ref 0.40–1.50)
GFR: 78 mL/min (ref >60.00)
Glucose, Bld: 82 mg/dL (ref 70–99)
Potassium: 4.7 mEq/L (ref 3.5–5.1)
Sodium: 137 mEq/L (ref 135–145)

## 2022-11-18 LAB — HEMOGLOBIN A1C: Hgb A1c MFr Bld: 6.3 % (ref 4.6–6.5)

## 2022-11-18 LAB — CK: Total CK: 81 U/L (ref 7–232)

## 2022-11-18 MED ORDER — PREDNISONE 20 MG PO TABS
ORAL_TABLET | ORAL | 0 refills | Status: AC
  Filled 2022-11-18: qty 16, 9d supply, fill #0

## 2022-11-18 MED ORDER — GABAPENTIN 300 MG PO CAPS
ORAL_CAPSULE | ORAL | 1 refills | Status: DC
  Filled 2022-11-18: qty 270, 90d supply, fill #0
  Filled 2023-05-06: qty 270, 90d supply, fill #1

## 2022-11-18 NOTE — Progress Notes (Signed)
Subjective:  Patient ID: Travis Huynh, male    DOB: Apr 20, 1966  Age: 57 y.o. MRN: [id number]  CC:  Chief Complaint  Patient presents with   Hospitalization Follow-up    Patient was seen 11/08/22 for elbow spur and is reporting he is out of medication (was given 5 days) and is in need of more, notes tinginling in his Lt arm and fingers     HPI Travis Huynh presents for   Left arm pain/elbow pain, dysesthesias ER follow-up. Seen March 3, suspected cervical radiculopathy with dysesthesias, radiating pain down left arm to elbow.  Elbow x-ray with olecranon spur but otherwise not concerning.  Cervical spine x-ray without acute findings or acute fracture/subluxation but multiple level chronic mild compression deformities primarily involving C3-C6 were identified.  Posterior elements were intact, soft tissues unremarkable.  Treated for possible cervical radiculopathy with Toradol injection, and short course of prednisone, 40 mg daily x 5 days.  Similar symptoms previously, had been followed by neurology at [facility].  Elbow pain and dysesthesias treated with gabapentin 300 mg twice daily.   Has not seen neuro - Dr. Melrose Nakayama - recently. Last visit in July 2023. Continued gabapentin '300mg'$  BID, 6 month follow up with possible cspine MRI. No appt scheduled.    EMG in May 2023: 01/05/22 EMG UPPER Impression: Normal study. There is no electrodiagnostic evidence of a large fiber neuropathy in the left upper extremity.   Left arm symptoms similar to symptoms in past, but worse. Had been taking gabapentin '300mg'$  BID, only mild symptoms but daily. Flared up on about 11/06/22. 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  No neck pain. Pain radiates from shoulder to hand - all fingers. No weakness. Intermittent shooting pains during the day - usually at night. Minimal with movement during the day - slight pains in elbow. Shock/burning pain.  Ran out of gabapentin, but still  taking wife's rx - still on '300mg'$  BID.  Toradol in ER helped.  Prednisone did lessen flares, then worsened off prednisone.       History Patient Active Problem List   Diagnosis Date Noted   Left knee pain 03/10/2021   Hypertension    Tobacco abuse    Acute renal failure superimposed on stage 2 chronic kidney disease (HCC)    Past Medical History:  Diagnosis Date   Anxiety    CKD (chronic kidney disease), stage II    Hyperlipidemia    past hx- controlled   Hypertension    Palpitations    Past Surgical History:  Procedure Laterality Date   COLONOSCOPY     MOUTH SURGERY     removed teeth   POLYPECTOMY     No Known Allergies Prior to Admission medications   Medication Sig Start Date End Date Taking? Authorizing Provider  amLODipine (NORVASC) 10 MG tablet Take 1 tablet (10 mg total) by mouth daily. 08/07/22 02/03/23 Yes Wendie Agreste, MD  colchicine 0.6 MG tablet Take 2 tablets at once, then take 1 tablet 1 hour later if needed for attack. 10/09/22  Yes Wendie Agreste, MD  gabapentin (NEURONTIN) 300 MG capsule TAKE 1 CAPSULE ('300MG'$ ) BY MOUTH IN THE MORNING AND 1 CAPSULE ('300MG'$ ) IN THE EVENING. 03/17/22  Yes   metoprolol tartrate (LOPRESSOR) 25 MG tablet Take 1 tablet (25 mg total) by mouth 2 (two) times daily. 08/07/22  Yes Wendie Agreste, MD  rosuvastatin (CRESTOR) 10 MG tablet Take 1 tablet (10 mg total) by mouth daily.  08/07/22  Yes Wendie Agreste, MD  sildenafil (VIAGRA) 100 MG tablet Take 0.5-1 tablets (50-100 mg total) by mouth daily as needed for erectile dysfunction. 08/07/22  Yes Wendie Agreste, MD   Social History   Socioeconomic History   Marital status: Married    Spouse name: Not on file   Number of children: Not on file   Years of education: Not on file   Highest education level: Not on file  Occupational History   Not on file  Tobacco Use   Smoking status: Every Day    Packs/day: 0.25    Types: Cigarettes   Smokeless tobacco: Never   Tobacco  comments:    pt smokes about 2 cigarettes a day   Vaping Use   Vaping Use: Never used  Substance and Sexual Activity   Alcohol use: Yes    Alcohol/week: 1.0 standard drink of alcohol    Types: 1 Cans of beer per week    Comment: occasionally   Drug use: Never   Sexual activity: Yes  Other Topics Concern   Not on file  Social History Narrative   Not on file   Social Determinants of Health   Financial Resource Strain: Not on file  Food Insecurity: Not on file  Transportation Needs: Not on file  Physical Activity: Not on file  Stress: Not on file  Social Connections: Not on file  Intimate Partner Violence: Not on file    Review of Systems Per hPI.   Objective:   Vitals:   11/18/22 1117  BP: 118/62  Pulse: 60  Temp: 98.7 F (37.1 C)  TempSrc: Temporal  SpO2: 100%  Weight: 158 lb 12.8 oz (72 kg)  Height: '5\' 7"'$  (1.702 m)     Physical Exam Constitutional:      General: He is not in acute distress.    Appearance: Normal appearance. He is well-developed.  HENT:     Head: Normocephalic and atraumatic.  Neck:     Comments: Cervical spine, minimal decreased rotation, lateral flexion, extension but does not cause any pain or change in the left arm symptoms. Negative Tinel's over the left brachial plexus. Cardiovascular:     Rate and Rhythm: Normal rate.  Pulmonary:     Effort: Pulmonary effort is normal.  Musculoskeletal:     Comments: Left shoulder, no bony tenderness including over the Thermal, AC or clavicle.  Pain-free range of motion of the shoulder.  Some diffuse discomfort over the soft tissue of the upper arm, elbow and forearm.  No focal lesions or swelling appreciated.  Circumference of upper arm equal to right. Tender over the proximal olecranon, distal tricep without apparent defect.  Able to flex and extend elbow but uncomfortable.  Wrist range of motion intact, strength with wrist resisted wrist movement intact.  Grip strength intact and neurovascular intact  distally to fingertips.  Neurological:     Mental Status: He is alert and oriented to person, place, and time.  Psychiatric:        Mood and Affect: Mood normal.        Assessment & Plan:  Travis Huynh is a 57 y.o. male . Prediabetes - Plan: Basic metabolic panel, Hemoglobin A1c  Left cervical radiculopathy - Plan: predniSONE (DELTASONE) 20 MG tablet, gabapentin (NEURONTIN) 300 MG capsule  Myalgia - Plan: CK, CK  Left elbow pain - Plan: predniSONE (DELTASONE) 20 MG tablet  Suspected cervical radiculopathy with flare approximately 2 weeks ago.  Some initial improvement after prednisone,  Toradol in the ER, then recurrence.  Multiple mild compression deformities noted.  Still suspect cervical radiculopathy, and may have component of tricep tendinopathy with pain at olecranon.  Spur noted on imaging.  Less likely rhabdo but does have some repetitive activity with work and diffuse myalgia as above.   -Will repeat prednisone, higher dose with taper.  Potential side effects discussed.  History of prediabetes with A1c 6.5 last time, check A1c, BMP today and 2-week follow-up.  -Evening symptoms worse, higher dose gabapentin at night ordered.  -May need to proceed with MRI of cervical spine as discussed with neurology previously but has not yet seen them in follow-up.  Will try prednisone as above, consider MRI if not improving or recurrent.  Advised to call his neurologist for follow-up. Recheck 2 weeks, RTC/ER precautions given.  Meds ordered this encounter  Medications   predniSONE (DELTASONE) 20 MG tablet    Sig: 3 by mouth for 3 days, then 2 by mouth for 2 days, then 1 by mouth for 2 days, then 1/2 by mouth for 2 days.    Dispense:  16 tablet    Refill:  0   gabapentin (NEURONTIN) 300 MG capsule    Sig: 1 capsule by mouth in the morning, 2 at night.    Dispense:  270 capsule    Refill:  1   Patient Instructions  I am sorry to hear that the arm symptoms have worsened.  Based on  the type of pain I still am suspicious for a nerve cause, potentially from your neck.  Try higher dosing of gabapentin with 2 pills at night, 1 during the day.  Watch for lightheadedness or dizziness on the higher dose and let me know if the side effects so we can adjust medication.  Will also retry the prednisone as that provides some relief before, but this time will be a slightly longer taper.  Pain at the elbow likely is from some of the radiating pain from the neck but also may have a component of tendinitis where the tricep muscle attaches to the elbow.  Prednisone and relative rest should help but I would like to recheck that area as well in 2 weeks.  Please call your neurologist for follow-up appointment.  If symptoms are  not improving with the prednisone may need to proceed with the MRI as they recommended.  I am happy to see you sooner if need be.  Hang in there!    Signed,   Merri Ray, MD [place] 11/18/22 12:26 PM

## 2022-11-18 NOTE — Patient Instructions (Signed)
I am sorry to hear that the arm symptoms have worsened.  Based on the type of pain I still am suspicious for a nerve cause, potentially from your neck.  Try higher dosing of gabapentin with 2 pills at night, 1 during the day.  Watch for lightheadedness or dizziness on the higher dose and let me know if the side effects so we can adjust medication.  Will also retry the prednisone as that provides some relief before, but this time will be a slightly longer taper.  Pain at the elbow likely is from some of the radiating pain from the neck but also may have a component of tendinitis where the tricep muscle attaches to the elbow.  Prednisone and relative rest should help but I would like to recheck that area as well in 2 weeks.  Please call your neurologist for follow-up appointment.  If symptoms are  not improving with the prednisone may need to proceed with the MRI as they recommended.  I am happy to see you sooner if need be.  Hang in there!

## 2022-11-24 NOTE — Progress Notes (Signed)
Cardiology Office Note:    Date:  11/27/2022   ID:  Travis Huynh, DOB 03-Jan-1966, MRN [id number]  PCP:  Wendie Agreste, MD   [place]  Cardiologist:  Freada Bergeron, MD  Advanced Practice Provider:  No care team member to display Electrophysiologist:  None   Referring MD: Wendie Agreste, MD   History of Present Illness:    Travis Huynh is a 57 y.o. male with a hx of HTN, tobacco use, and anxiety who presents to clinic for follow-up.  Patient initially seen in was initially seen in 10/2020 for palpitations. Cardiac monitor at that time with brief runs of nonsustained SVT with longest lasting 13beats. Was seen in 01/2021 where he continued to have palpitations in the setting of stress and missing doses of the metoprolol.   ETT 06/2021 normal.  Was last seen in clinic on 05/2022 where he was doing well from a CV standpoint.  Today, the patient feels well. No palpitations, chest pain, SOB, LE edema, orthopnea or PND. Tolerating medications as prescribed and BP is well controlled at home. No dizziness, lightheadedness or fatigue. HR 50s but he feels well and states he does not want to adjust dosing at this time.  Working on cutting back smoking.  Past Medical History:  Diagnosis Date   Anxiety    CKD (chronic kidney disease), stage II    Hyperlipidemia    past hx- controlled   Hypertension    Palpitations     Past Surgical History:  Procedure Laterality Date   COLONOSCOPY     MOUTH SURGERY     removed teeth   POLYPECTOMY      Current Medications: Current Meds  Medication Sig   colchicine 0.6 MG tablet Take 2 tablets at once, then take 1 tablet 1 hour later if needed for attack.   gabapentin (NEURONTIN) 300 MG capsule Take 1 capsule by mouth in the morning and 2 capsules at night.   predniSONE (DELTASONE) 20 MG tablet Take 3 tablets (60 mg total) by mouth daily for 3 days, THEN 2 tablets (40 mg total) daily for 2 days, THEN 1  tablet (20 mg total) daily for 2 days, THEN 0.5 tablets (10 mg total) daily for 2 days.   sildenafil (VIAGRA) 100 MG tablet Take 0.5-1 tablets (50-100 mg total) by mouth daily as needed for erectile dysfunction.   [DISCONTINUED] amLODipine (NORVASC) 10 MG tablet Take 1 tablet (10 mg total) by mouth daily.   [DISCONTINUED] metoprolol tartrate (LOPRESSOR) 25 MG tablet Take 1 tablet (25 mg total) by mouth 2 (two) times daily.   [DISCONTINUED] rosuvastatin (CRESTOR) 10 MG tablet Take 1 tablet (10 mg total) by mouth daily.     Allergies:   Patient has no known allergies.   Social History   Socioeconomic History   Marital status: Married    Spouse name: Not on file   Number of children: Not on file   Years of education: Not on file   Highest education level: Not on file  Occupational History   Not on file  Tobacco Use   Smoking status: Every Day    Packs/day: .25    Types: Cigarettes   Smokeless tobacco: Never   Tobacco comments:    pt smokes about 2 cigarettes a day   Vaping Use   Vaping Use: Never used  Substance and Sexual Activity   Alcohol use: Yes    Alcohol/week: 1.0 standard drink of alcohol    Types:  1 Cans of beer per week    Comment: occasionally   Drug use: Never   Sexual activity: Yes  Other Topics Concern   Not on file  Social History Narrative   Not on file   Social Determinants of Health   Financial Resource Strain: Not on file  Food Insecurity: Not on file  Transportation Needs: Not on file  Physical Activity: Not on file  Stress: Not on file  Social Connections: Not on file     Family History: The patient's family history includes Prostate cancer in his maternal uncle. There is no history of Colon cancer, Colon polyps, Esophageal cancer, Stomach cancer, or Rectal cancer.  ROS:   Please see the history of present illness.    Review of Systems  Constitutional:  Negative for chills and fever.  HENT:  Negative for sore throat.   Respiratory:  Negative  for shortness of breath.   Cardiovascular:  Negative for chest pain, palpitations, orthopnea, claudication, leg swelling and PND.  Gastrointestinal:  Negative for melena, nausea and vomiting.  Genitourinary:  Negative for dysuria and flank pain.  Musculoskeletal:  Negative for falls and myalgias.  Neurological:  Negative for dizziness and loss of consciousness.  Psychiatric/Behavioral:  Negative for depression.     EKGs/Labs/Other Studies Reviewed:    The following studies were reviewed today: ETT 06/29/2021:   No ST deviation was noted.   Prior study not available for comparison.  Cardiac monitor 11/2020: Patch wear time was 6 days and 13 hours. Predominant rhythm was NSR with average HR 59bpm; ranging from 40bpm-132bpm There were 2 runs of SVT with fastest/longest interval 13 beats with max rate of 132bpm Rare SVE <1%, rare PVCs <1% No afib, VT, or significant pauses Patient triggered event correlates with NSR.     Patch Wear Time:  6 days and 13 hours (2022-03-03T19:07:46-0500 to 2022-03-10T09:06:11-498)   Patient had a min HR of 40 bpm, max HR of 132 bpm, and avg HR of 59 bpm. Predominant underlying rhythm was Sinus Rhythm. 2 Supraventricular Tachycardia runs occurred, the run with the fastest interval lasting 13 beats with a max rate of 132 bpm (avg 122  bpm); the run with the fastest interval was also the longest. Isolated SVEs were rare (<1.0%), SVE Couplets were rare (<1.0%), and SVE Triplets were rare (<1.0%). Isolated VEs were rare (<1.0%), and no VE Couplets or VE Triplets were present.    Negative adequate stress test without evidence of ischemia at given workload.  ECG: sinus bradycardia with LVH, HR 50  Recent Labs: 08/07/2022: ALT 34 11/18/2022: BUN 19; Creatinine, Ser 1.06; Potassium 4.7; Sodium 137  Recent Lipid Panel    Component Value Date/Time   CHOL 136 08/07/2022 0859   CHOL 152 01/19/2022 1001   TRIG 69.0 08/07/2022 0859   HDL 36.20 (L) 08/07/2022 0859    HDL 40 01/19/2022 1001   CHOLHDL 4 08/07/2022 0859   VLDL 13.8 08/07/2022 0859   LDLCALC 86 08/07/2022 0859   LDLCALC 89 01/19/2022 1001      Physical Exam:    VS:  BP 138/88   Pulse (!) 50   Ht 5\' 7"  (1.702 m)   Wt 167 lb 3.2 oz (75.8 kg)   SpO2 96%   BMI 26.19 kg/m     Wt Readings from Last 3 Encounters:  11/27/22 167 lb 3.2 oz (75.8 kg)  11/18/22 158 lb 12.8 oz (72 kg)  11/08/22 155 lb (70.3 kg)     GEN:  Well  nourished, well developed in no acute distress HEENT: Normal NECK: No JVD; No carotid bruits CARDIAC: Bradycardic, regular, no murmurs RESPIRATORY:  Clear to auscultation without rales, wheezing or rhonchi  ABDOMEN: Soft, non-tender, non-distended MUSCULOSKELETAL:  No edema; No deformity  SKIN: Warm and dry NEUROLOGIC:  Alert and oriented x 3 PSYCHIATRIC:  Normal affect   ASSESSMENT:    1. Palpitations   2. Essential hypertension   3. Mixed hyperlipidemia   4. Family history of cardiovascular disease   5. Medication management   6. Tobacco abuse     PLAN:    In order of problems listed above:  #Palpitations: Cardiac monitor with brief runs of nonsustained SVT. Symptoms well controlled on metoprolol. -Continue metop 25mg  BID with extra dose as needed for palpitations  #Left arm heaviness: Resolved.  ETT with no evidence of ischemia.  #HTN: #LVH on ECG: Well controlled at home. -Continue amlodipine 10mg  daily -Continue metop 25mg  BID  #HLD: LDL 86 with goal <100.  -Continue crestor 10mg  daily -Declined Ca score -Continue lifestyle modifications as detailed below  #Tobacco Abuse: -Tobacco cessation counseling provided today; patient motivated to quit  Exercise recommendations: Goal of exercising for at least 30 minutes a day, at least 5 times per week.  Please exercise to a moderate exertion.  This means that while exercising it is difficult to speak in full sentences, however you are not so short of breath that you feel you must stop,  and not so comfortable that you can carry on a full conversation.  Exertion level should be approximately a 5/10, if 10 is the most exertion you can perform.  Diet recommendations: Recommend a heart healthy diet such as the Mediterranean diet.  This diet consists of plant based foods, healthy fats, lean meats, olive oil.  It suggests limiting the intake of simple carbohydrates such as white breads, pastries, and pastas.  It also limits the amount of red meat, wine, and dairy products such as cheese that one should consume on a daily basis.   Medication Adjustments/Labs and Tests Ordered: Current medicines are reviewed at length with the patient today.  Concerns regarding medicines are outlined above.  No orders of the defined types were placed in this encounter.  Meds ordered this encounter  Medications   amLODipine (NORVASC) 10 MG tablet    Sig: Take 1 tablet (10 mg total) by mouth daily.    Dispense:  90 tablet    Refill:  3   metoprolol tartrate (LOPRESSOR) 25 MG tablet    Sig: Take 1 tablet (25 mg total) by mouth 2 (two) times daily.    Dispense:  180 tablet    Refill:  3   rosuvastatin (CRESTOR) 10 MG tablet    Sig: Take 1 tablet (10 mg total) by mouth daily.    Dispense:  90 tablet    Refill:  3    Patient Instructions  Medication Instructions:   Your physician recommends that you continue on your current medications as directed. Please refer to the Current Medication list given to you today.  *If you need a refill on your cardiac medications before your next appointment, please call your pharmacy*    Follow-Up: At [facility], you and your health needs are our priority.  As part of our continuing mission to provide you with exceptional heart care, we have created designated Provider Care Teams.  These Care Teams include your primary Cardiologist (physician) and Advanced Practice Providers (APPs -  Physician Assistants and Nurse  Practitioners) who all work together  to provide you with the care you need, when you need it.  We recommend signing up for the patient portal called "MyChart".  Sign up information is provided on this After Visit Summary.  MyChart is used to connect with patients for Virtual Visits (Telemedicine).  Patients are able to view lab/test results, encounter notes, upcoming appointments, etc.  Non-urgent messages can be sent to your provider as well.   To learn more about what you can do with MyChart, go to [email or web address].    Your next appointment:   1 year(s)  Provider:   Freada Bergeron, MD         Signed, Freada Bergeron, MD  11/27/2022 8:40 AM    [place]

## 2022-11-27 ENCOUNTER — Other Ambulatory Visit (HOSPITAL_COMMUNITY): Payer: Self-pay

## 2022-11-27 ENCOUNTER — Ambulatory Visit: Payer: 59 | Attending: Cardiology | Admitting: Cardiology

## 2022-11-27 ENCOUNTER — Encounter: Payer: Self-pay | Admitting: Cardiology

## 2022-11-27 VITALS — BP 138/88 | HR 50 | Ht 67.0 in | Wt 167.2 lb

## 2022-11-27 DIAGNOSIS — Z8249 Family history of ischemic heart disease and other diseases of the circulatory system: Secondary | ICD-10-CM

## 2022-11-27 DIAGNOSIS — Z720 Tobacco use: Secondary | ICD-10-CM

## 2022-11-27 DIAGNOSIS — E782 Mixed hyperlipidemia: Secondary | ICD-10-CM

## 2022-11-27 DIAGNOSIS — R002 Palpitations: Secondary | ICD-10-CM | POA: Diagnosis not present

## 2022-11-27 DIAGNOSIS — Z79899 Other long term (current) drug therapy: Secondary | ICD-10-CM

## 2022-11-27 DIAGNOSIS — I10 Essential (primary) hypertension: Secondary | ICD-10-CM

## 2022-11-27 MED ORDER — METOPROLOL TARTRATE 25 MG PO TABS
25.0000 mg | ORAL_TABLET | Freq: Two times a day (BID) | ORAL | 3 refills | Status: DC
  Filled 2022-11-27: qty 180, 90d supply, fill #0

## 2022-11-27 MED ORDER — ROSUVASTATIN CALCIUM 10 MG PO TABS
10.0000 mg | ORAL_TABLET | Freq: Every day | ORAL | 3 refills | Status: DC
  Filled 2022-11-27 – 2023-01-21 (×2): qty 90, 90d supply, fill #0
  Filled 2023-05-06: qty 90, 90d supply, fill #1

## 2022-11-27 MED ORDER — AMLODIPINE BESYLATE 10 MG PO TABS
10.0000 mg | ORAL_TABLET | Freq: Every day | ORAL | 3 refills | Status: DC
  Filled 2022-11-27 – 2023-01-20 (×2): qty 90, 90d supply, fill #0
  Filled 2023-05-06: qty 90, 90d supply, fill #1

## 2022-11-27 NOTE — Patient Instructions (Signed)
Medication Instructions:   Your physician recommends that you continue on your current medications as directed. Please refer to the Current Medication list given to you today.  *If you need a refill on your cardiac medications before your next appointment, please call your pharmacy*    Follow-Up: At [facility], you and your health needs are our priority.  As part of our continuing mission to provide you with exceptional heart care, we have created designated Provider Care Teams.  These Care Teams include your primary Cardiologist (physician) and Advanced Practice Providers (APPs -  Physician Assistants and Nurse Practitioners) who all work together to provide you with the care you need, when you need it.  We recommend signing up for the patient portal called "MyChart".  Sign up information is provided on this After Visit Summary.  MyChart is used to connect with patients for Virtual Visits (Telemedicine).  Patients are able to view lab/test results, encounter notes, upcoming appointments, etc.  Non-urgent messages can be sent to your provider as well.   To learn more about what you can do with MyChart, go to [email or web address].    Your next appointment:   1 year(s)  Provider:   Heather E Pemberton, MD      

## 2022-12-02 ENCOUNTER — Emergency Department (HOSPITAL_COMMUNITY): Payer: 59

## 2022-12-02 ENCOUNTER — Emergency Department (HOSPITAL_COMMUNITY)
Admission: EM | Admit: 2022-12-02 | Discharge: 2022-12-03 | Disposition: A | Discharge status: Home / Self Care | Payer: 59 | Attending: Emergency Medicine | Admitting: Emergency Medicine

## 2022-12-02 ENCOUNTER — Encounter (HOSPITAL_COMMUNITY): Payer: Self-pay

## 2022-12-02 DIAGNOSIS — M25522 Pain in left elbow: Secondary | ICD-10-CM | POA: Diagnosis not present

## 2022-12-02 DIAGNOSIS — I10 Essential (primary) hypertension: Secondary | ICD-10-CM | POA: Insufficient documentation

## 2022-12-02 DIAGNOSIS — R202 Paresthesia of skin: Secondary | ICD-10-CM | POA: Insufficient documentation

## 2022-12-02 DIAGNOSIS — Z79899 Other long term (current) drug therapy: Secondary | ICD-10-CM | POA: Insufficient documentation

## 2022-12-02 HISTORY — DX: Enthesopathy, unspecified: M77.9

## 2022-12-02 NOTE — ED Triage Notes (Signed)
Pt arrives POV, reports recurrent left elbow pain d/t bone spurs, hit his elbow tonight at work which caused the flare up in pain, also reports nerve damage to that arm as well. NAD noted, VSS.

## 2022-12-03 ENCOUNTER — Other Ambulatory Visit (HOSPITAL_COMMUNITY): Payer: Self-pay

## 2022-12-03 DIAGNOSIS — M25522 Pain in left elbow: Secondary | ICD-10-CM | POA: Diagnosis not present

## 2022-12-03 MED ORDER — ACETAMINOPHEN 500 MG PO TABS
1000.0000 mg | ORAL_TABLET | Freq: Once | ORAL | Status: AC
  Administered 2022-12-03: 1000 mg via ORAL
  Filled 2022-12-03: qty 2

## 2022-12-03 MED ORDER — KETOROLAC TROMETHAMINE 60 MG/2ML IM SOLN
60.0000 mg | Freq: Once | INTRAMUSCULAR | Status: AC
  Administered 2022-12-03: 60 mg via INTRAMUSCULAR
  Filled 2022-12-03: qty 2

## 2022-12-03 MED ORDER — HYDROCODONE-ACETAMINOPHEN 5-325 MG PO TABS
2.0000 | ORAL_TABLET | ORAL | 0 refills | Status: DC | PRN
  Filled 2022-12-03: qty 10, 1d supply, fill #0

## 2022-12-03 MED ORDER — NAPROXEN 500 MG PO TABS: 500.0000 mg | ORAL_TABLET | Freq: Two times a day (BID) | ORAL | 0 refills | Status: DC

## 2022-12-03 MED ORDER — HYDROCODONE-ACETAMINOPHEN 5-325 MG PO TABS: 2.0000 | ORAL_TABLET | ORAL | 0 refills | Status: DC | PRN

## 2022-12-03 MED ORDER — HYDROCODONE-ACETAMINOPHEN 5-325 MG PO TABS: 1.0000 | ORAL_TABLET | ORAL | 0 refills | Status: DC | PRN

## 2022-12-03 MED ORDER — NAPROXEN 500 MG PO TABS
500.0000 mg | ORAL_TABLET | Freq: Two times a day (BID) | ORAL | 0 refills | Status: DC
  Filled 2022-12-03: qty 30, 15d supply, fill #0

## 2022-12-03 NOTE — Addendum Note (Signed)
Addended by: Ronie Spies on: 12/03/2022 09:16 AM   Modules accepted: Orders

## 2022-12-03 NOTE — ED Provider Notes (Signed)
[place] EMERGENCY DEPARTMENT AT [facility] Provider Note   CSN: [id number] Arrival date & time: 12/02/22  2322     History  Chief Complaint  Patient presents with   Left Elbow Pain    Travis Huynh is a 57 y.o. male.  The history is provided by the patient and medical records.   57 y.o. M with hx of HTN, presenting to the ED for left elbow pain.  History of bone spurs in this area as well as chronic nerve damage to left arm.  States he hit his elbow at work tonight twice which has significantly exacerbated his pain.  Pain worse with flexion/extension of elbow and direct pressure applied.  Reports some tingling of the fingers, no focal numbness.  He is right hand dominant.  He does take gabapentin for nerve pain, recently had dosage upped.  No additional medications taken tonight.  Home Medications Prior to Admission medications   Medication Sig Start Date End Date Taking? Authorizing Provider  amLODipine (NORVASC) 10 MG tablet Take 1 tablet (10 mg total) by mouth daily. 11/27/22   Freada Bergeron, MD  colchicine 0.6 MG tablet Take 2 tablets at once, then take 1 tablet 1 hour later if needed for attack. 10/09/22   Wendie Agreste, MD  gabapentin (NEURONTIN) 300 MG capsule Take 1 capsule by mouth in the morning and 2 capsules at night. 11/18/22   Wendie Agreste, MD  metoprolol tartrate (LOPRESSOR) 25 MG tablet Take 1 tablet (25 mg total) by mouth 2 (two) times daily. 11/27/22   Freada Bergeron, MD  rosuvastatin (CRESTOR) 10 MG tablet Take 1 tablet (10 mg total) by mouth daily. 11/27/22   Freada Bergeron, MD  sildenafil (VIAGRA) 100 MG tablet Take 0.5-1 tablets (50-100 mg total) by mouth daily as needed for erectile dysfunction. 08/07/22   Wendie Agreste, MD      Allergies    Patient has no known allergies.    Review of Systems   Review of Systems  Musculoskeletal:  Positive for arthralgias.  All other systems reviewed and are  negative.   Physical Exam Updated Vital Signs BP 121/87 (BP Location: Right Arm)   Pulse 75   Temp 99.7 F (37.6 C) (Oral)   Resp 18   Ht 5\' 7"  (1.702 m)   Wt 72.6 kg   SpO2 97%   BMI 25.06 kg/m   Physical Exam Vitals and nursing note reviewed.  Constitutional:      Appearance: He is well-developed.  HENT:     Head: Normocephalic and atraumatic.  Eyes:     Conjunctiva/sclera: Conjunctivae normal.     Pupils: Pupils are equal, round, and reactive to light.  Cardiovascular:     Rate and Rhythm: Normal rate and regular rhythm.     Heart sounds: Normal heart sounds.  Pulmonary:     Effort: Pulmonary effort is normal.     Breath sounds: Normal breath sounds.  Abdominal:     General: Bowel sounds are normal.     Palpations: Abdomen is soft.  Musculoskeletal:        General: Normal range of motion.     Cervical back: Normal range of motion.     Comments: Left elbow held flexed across the chest, is able to extend but with some pain, tenderness over olecranon without deformity or effusion present, no erythema or other overlying skin changes, radial pulse intact, normal grip strength  Skin:    General: Skin is  warm and dry.  Neurological:     Mental Status: He is alert and oriented to person, place, and time.     ED Results / Procedures / Treatments   Labs (all labs ordered are listed, but only abnormal results are displayed) Labs Reviewed - No data to display  EKG None  Radiology DG Elbow Complete Left  Result Date: 12/03/2022 CLINICAL DATA:  pain/bone spur EXAM: LEFT ELBOW - COMPLETE 3+ VIEW COMPARISON:  None Available. FINDINGS: There is no evidence of fracture, dislocation, or joint effusion. Olecranon enthesopathy. There is no evidence of arthropathy or other focal bone abnormality. Soft tissues are unremarkable. IMPRESSION: No acute displaced fracture or dislocation. Electronically Signed   By: Iven Finn M.D.   On: 12/03/2022 00:12    Procedures Procedures     Medications Ordered in ED Medications  ketorolac (TORADOL) injection 60 mg (60 mg Intramuscular Given 12/03/22 0050)  acetaminophen (TYLENOL) tablet 1,000 mg (1,000 mg Oral Given 12/03/22 0110)    ED Course/ Medical Decision Making/ A&P                             Medical Decision Making Amount and/or Complexity of Data Reviewed Radiology: ordered and independent interpretation performed.  Risk OTC drugs. Prescription drug management.   57 year old male here with left elbow pain.  Recurrent issue for him due to bone spurs and chronic nerve damage in left arm.  Reports he struck his elbow at work tonight twice which seems to have exacerbated his pain.  He has no acute deformity on exam, no open wounds or overlying skin changes.  He is able to range his elbow but with some pain.  Arm is neurovascularly intact.  X-rays negative for acute bony findings.  Feel he is stable for discharge with symptomatic care.  He was given sling for comfort, declined work note when offered.  Can follow-up with PCP.  Return here for new concerns.  Final Clinical Impression(s) / ED Diagnoses Final diagnoses:  Left elbow pain    Rx / DC Orders ED Discharge Orders         Ordered     12/03/22 0112     12/03/22 0112    HYDROcodone-acetaminophen (NORCO/VICODIN) 5-325 MG tablet  Every 4 hours PRN        12/03/22 0127    naproxen (NAPROSYN) 500 MG tablet  2 times daily        12/03/22 0127              Larene Pickett, PA-C [id number]    Delora Fuel, MD [id number] [phone]

## 2022-12-03 NOTE — Discharge Instructions (Signed)
Take the prescribed medication as directed.  Be careful with the hydrocodone, it can make you drowsy. Follow-up with your primary care doctor. Return to the ED for new or worsening symptoms.

## 2022-12-10 ENCOUNTER — Encounter: Payer: Self-pay | Admitting: Family Medicine

## 2022-12-10 ENCOUNTER — Other Ambulatory Visit (HOSPITAL_COMMUNITY): Payer: Self-pay

## 2022-12-10 ENCOUNTER — Ambulatory Visit: Payer: 59 | Admitting: Family Medicine

## 2022-12-10 VITALS — BP 122/84 | HR 55 | Temp 98.2°F | Ht 67.0 in | Wt 161.4 lb

## 2022-12-10 DIAGNOSIS — M25522 Pain in left elbow: Secondary | ICD-10-CM | POA: Diagnosis not present

## 2022-12-10 DIAGNOSIS — Z8739 Personal history of other diseases of the musculoskeletal system and connective tissue: Secondary | ICD-10-CM | POA: Diagnosis not present

## 2022-12-10 DIAGNOSIS — M5412 Radiculopathy, cervical region: Secondary | ICD-10-CM

## 2022-12-10 MED ORDER — COLCHICINE 0.6 MG PO TABS
ORAL_TABLET | ORAL | 0 refills | Status: DC
  Filled 2022-12-10: qty 12, 4d supply, fill #0

## 2022-12-10 NOTE — Progress Notes (Signed)
Subjective:  Patient ID: Travis Huynh, male    DOB: August 23, 1966  Age: 57 y.o. MRN: [id number]  CC:  Chief Complaint  Patient presents with   Follow-up    Pt states that he went to ER last Thursday with elbow and arm pain and was told he has bone spurs and nerve damage. Pt states pain has been minimal since ER visit with taking medication prescribed.     HPI Travis Huynh presents for   Left arm pain: Follow up March 13th visit.  Suspected cervical radiculopathy with flare, seen in the ER with some initial improvement with prednisone, Toradol injection, then symptoms recurred.  Possible component of tricep tendinopathy with pain at olecranon.  Repeat prednisone given with higher dose, taper, gabapentin with dosing options, including higher dose at night. Has seen neurology, Dr. Melrose Nakayama, with [facility] clinic for similar symptoms in the past.  Discussion of possible MRI for C-spine if persistent.  Did have a normal EMG in May 2023.  He was seen again in the ER March 27 after hitting elbow at work with exacerbation of pain.  X-ray negative for fracture.  Toradol injection, Tylenol given.  Sling for comfort, hydrocodone #10, naproxen.   Has been feeling better. Elbow is much better. Radiating pain down arm and into fingers improved - no more burning. Improved with prednisone the first night of higher dose.  Has not needed gabapentin in past week with other meds.    Gout: Last flare: few months ago Daily meds: Prn med: cochicine as needed - needs refill Lab Results  Component Value Date   LABURIC 8.6 (H) 08/07/2022        History Patient Active Problem List   Diagnosis Date Noted   Left knee pain 03/10/2021   Hypertension    Tobacco abuse    Acute renal failure superimposed on stage 2 chronic kidney disease    Past Medical History:  Diagnosis Date   Anxiety    Bone spur    left elbow   CKD (chronic kidney disease), stage II    Hyperlipidemia    past hx- controlled    Hypertension    Palpitations    Past Surgical History:  Procedure Laterality Date   COLONOSCOPY     MOUTH SURGERY     removed teeth   POLYPECTOMY     No Known Allergies Prior to Admission medications   Medication Sig Start Date End Date Taking? Authorizing Provider  amLODipine (NORVASC) 10 MG tablet Take 1 tablet (10 mg total) by mouth daily. 11/27/22   Freada Bergeron, MD  colchicine 0.6 MG tablet Take 2 tablets at once, then take 1 tablet 1 hour later if needed for attack. 10/09/22   Wendie Agreste, MD  gabapentin (NEURONTIN) 300 MG capsule Take 1 capsule by mouth in the morning and 2 capsules at night. 11/18/22   Wendie Agreste, MD  HYDROcodone-acetaminophen (NORCO/VICODIN) 5-325 MG tablet Take 2 tablets by mouth every 4 (four) hours as needed. 12/03/22   Larene Pickett, PA-C  HYDROcodone-acetaminophen (NORCO/VICODIN) 5-325 MG tablet Take 2 tablets by mouth every 4 (four) hours as needed. 12/03/22   Larene Pickett, PA-C  metoprolol tartrate (LOPRESSOR) 25 MG tablet Take 1 tablet (25 mg total) by mouth 2 (two) times daily. 11/27/22   Freada Bergeron, MD  naproxen (NAPROSYN) 500 MG tablet Take 1 tablet (500 mg total) by mouth 2 (two) times daily. 12/03/22   Larene Pickett, PA-C  naproxen (NAPROSYN) 500  MG tablet Take 1 tablet (500 mg total) by mouth 2 (two) times daily. 12/03/22   Larene Pickett, PA-C  rosuvastatin (CRESTOR) 10 MG tablet Take 1 tablet (10 mg total) by mouth daily. 11/27/22   Freada Bergeron, MD  sildenafil (VIAGRA) 100 MG tablet Take 0.5-1 tablets (50-100 mg total) by mouth daily as needed for erectile dysfunction. 08/07/22   Wendie Agreste, MD   Social History   Socioeconomic History   Marital status: Married    Spouse name: Not on file   Number of children: Not on file   Years of education: Not on file   Highest education level: Not on file  Occupational History   Not on file  Tobacco Use   Smoking status: Every Day    Packs/day: .25    Types:  Cigarettes   Smokeless tobacco: Never   Tobacco comments:    pt smokes about 2 cigarettes a day   Vaping Use   Vaping Use: Never used  Substance and Sexual Activity   Alcohol use: Yes    Alcohol/week: 1.0 standard drink of alcohol    Types: 1 Cans of beer per week    Comment: occasionally   Drug use: Never   Sexual activity: Yes  Other Topics Concern   Not on file  Social History Narrative   Not on file   Social Determinants of Health   Financial Resource Strain: Not on file  Food Insecurity: Not on file  Transportation Needs: Not on file  Physical Activity: Not on file  Stress: Not on file  Social Connections: Not on file  Intimate Partner Violence: Not on file    Review of Systems   Objective:   Vitals:   12/10/22 0814  BP: 122/84  Pulse: (!) 55  Temp: 98.2 F (36.8 C)  TempSrc: Temporal  SpO2: 100%  Weight: 161 lb 6.4 oz (73.2 kg)  Height: 5\' 7"  (1.702 m)     Physical Exam Constitutional:      General: He is not in acute distress.    Appearance: Normal appearance. He is well-developed.  HENT:     Head: Normocephalic and atraumatic.  Cardiovascular:     Rate and Rhythm: Normal rate.  Pulmonary:     Effort: Pulmonary effort is normal.  Musculoskeletal:     Comments: C-spine, pain-free range of motion without midline bony tenderness.  No arm symptoms with range of motion of C-spine. Full range of motion of left shoulder, pain-free.  Equal upper extremity strength bilaterally, no current dysesthesias.  Left elbow, nontender, skin intact without appreciable swelling or ecchymosis.  Neurological:     Mental Status: He is alert and oriented to person, place, and time.  Psychiatric:        Mood and Affect: Mood normal.       Assessment & Plan:  Travis Huynh is a 57 y.o. male . Left cervical radiculopathy  -Improved after repeat prednisone taper as above.  Stable off gabapentin at this time.  Option to restart gabapentin as he discontinues  naproxen that was prescribed for elbow injury.  If neuropathy returns, advised to call neurologist as possible plan for MRI or to decide on next step.  History of gout - Plan: colchicine 0.6 MG tablet  -Infrequent flares, colchicine refilled if needed with option of daily medication if frequent flares or early recurrence.  32-month follow-up, consider uric acid testing with next labs.  Left elbow pain  -Initial possible tendinitis versus component of radiculopathy,  recent ER visit noted for contusion.  Improved after Toradol injection, initial hydrocodone, naproxen.  Plan is to wean off naproxen, RTC precautions given.  Reassuring exam at present.   Meds ordered this encounter  Medications   colchicine 0.6 MG tablet    Sig: Take 2 tablets at once, then take 1 tablet 1 hour later if needed for attack.    Dispense:  12 tablet    Refill:  0   Patient Instructions  I am glad to hear that your arm symptoms and elbow pain has improved.  If return of tingling, numbness or pain down the arm, restart gabapentin and call neurologist to decide if MRI or other testing/treatment needed.  If gabapentin is not helping, schedule a visit with me and we can discuss other options including possible prednisone again.   Colchicine refilled for gout if you do have a flare.  If you have frequent flares, would recommend daily medication or checking the blood test to determine the gout control as that has been elevated previously.  Keep me posted.  Recheck in 5 months for repeat labs at that time, let me know if there are questions in the meantime.  Here is the number for your neurologist if needed: Doneta Public. Melrose Nakayama, MD Neurologist in [place], [place] Address: [address] [place], [place], [place] [address] Phone: [phone]     Signed,   Merri Ray, MD [place], [facility] 12/10/22 8:31 AM

## 2022-12-10 NOTE — Patient Instructions (Addendum)
I am glad to hear that your arm symptoms and elbow pain has improved.  If return of tingling, numbness or pain down the arm, restart gabapentin and call neurologist to decide if MRI or other testing/treatment needed.  If gabapentin is not helping, schedule a visit with me and we can discuss other options including possible prednisone again.   Colchicine refilled for gout if you do have a flare.  If you have frequent flares, would recommend daily medication or checking the blood test to determine the gout control as that has been elevated previously.  Keep me posted.  Recheck in 5 months for repeat labs at that time, let me know if there are questions in the meantime.  Here is the number for your neurologist if needed: Doneta Public. Melrose Nakayama, MD Neurologist in [place], [place] Address: [address] [place], [place], [place] [address] Phone: [phone]

## 2022-12-22 ENCOUNTER — Emergency Department (HOSPITAL_COMMUNITY): Payer: 59

## 2022-12-22 ENCOUNTER — Emergency Department (HOSPITAL_COMMUNITY)
Admission: EM | Admit: 2022-12-22 | Discharge: 2022-12-22 | Disposition: A | Discharge status: Home / Self Care | Payer: PRIVATE HEALTH INSURANCE | Attending: Emergency Medicine | Admitting: Emergency Medicine

## 2022-12-22 ENCOUNTER — Other Ambulatory Visit: Payer: Self-pay

## 2022-12-22 DIAGNOSIS — W19XXXA Unspecified fall, initial encounter: Secondary | ICD-10-CM | POA: Diagnosis not present

## 2022-12-22 DIAGNOSIS — Z79899 Other long term (current) drug therapy: Secondary | ICD-10-CM | POA: Diagnosis not present

## 2022-12-22 DIAGNOSIS — F17200 Nicotine dependence, unspecified, uncomplicated: Secondary | ICD-10-CM | POA: Diagnosis not present

## 2022-12-22 DIAGNOSIS — S62114A Nondisplaced fracture of triquetrum [cuneiform] bone, right wrist, initial encounter for closed fracture: Secondary | ICD-10-CM | POA: Diagnosis not present

## 2022-12-22 DIAGNOSIS — N182 Chronic kidney disease, stage 2 (mild): Secondary | ICD-10-CM | POA: Diagnosis not present

## 2022-12-22 DIAGNOSIS — I129 Hypertensive chronic kidney disease with stage 1 through stage 4 chronic kidney disease, or unspecified chronic kidney disease: Secondary | ICD-10-CM | POA: Diagnosis not present

## 2022-12-22 DIAGNOSIS — S6991XA Unspecified injury of right wrist, hand and finger(s), initial encounter: Secondary | ICD-10-CM | POA: Diagnosis present

## 2022-12-22 MED ORDER — KETOROLAC TROMETHAMINE 15 MG/ML IJ SOLN
15.0000 mg | Freq: Once | INTRAMUSCULAR | Status: AC
  Administered 2022-12-22: 15 mg via INTRAMUSCULAR
  Filled 2022-12-22: qty 1

## 2022-12-22 NOTE — ED Notes (Signed)
Ortho at bedside.

## 2022-12-22 NOTE — ED Provider Notes (Signed)
[place] EMERGENCY DEPARTMENT AT [facility] Provider Note   CSN: [id number] Arrival date & time: 12/22/22  4098     History  Chief Complaint  Patient presents with   Fall   Wrist Pain    Travis Huynh is a 57 y.o. male with HTN, tobacco abuse, CKD stage II presents with fall and R wrist pain.   C/o mechanical fall Sunday at work after tripping over a rug. He has had right wrist swelling that started that night and has continued. Also complains of numbness/tingling to his R 3rd-5th digits. Doesn't have significant pain. Tried tylenol which didn't help much. Did not hit his head or lose consciousness when he fell, complains of no other injuries. Otherwise in his normal state of health.    HPI     Home Medications Prior to Admission medications   Medication Sig Start Date End Date Taking? Authorizing Provider  amLODipine (NORVASC) 10 MG tablet Take 1 tablet (10 mg total) by mouth daily. 11/27/22   Meriam Sprague, MD  colchicine 0.6 MG tablet Take 2 tablets at once, then take 1 tablet 1 hour later if needed for attack. 12/10/22   Shade Flood, MD  gabapentin (NEURONTIN) 300 MG capsule Take 1 capsule by mouth in the morning and 2 capsules at night. 11/18/22   Shade Flood, MD  HYDROcodone-acetaminophen (NORCO/VICODIN) 5-325 MG tablet Take 2 tablets by mouth every 4 (four) hours as needed. 12/03/22   Garlon Hatchet, PA-C  HYDROcodone-acetaminophen (NORCO/VICODIN) 5-325 MG tablet Take 2 tablets by mouth every 4 (four) hours as needed. 12/03/22   Garlon Hatchet, PA-C  metoprolol tartrate (LOPRESSOR) 25 MG tablet Take 1 tablet (25 mg total) by mouth 2 (two) times daily. 11/27/22   Meriam Sprague, MD  naproxen (NAPROSYN) 500 MG tablet Take 1 tablet (500 mg total) by mouth 2 (two) times daily. 12/03/22   Garlon Hatchet, PA-C  naproxen (NAPROSYN) 500 MG tablet Take 1 tablet (500 mg total) by mouth 2 (two) times daily. 12/03/22   Garlon Hatchet, PA-C   rosuvastatin (CRESTOR) 10 MG tablet Take 1 tablet (10 mg total) by mouth daily. 11/27/22   Meriam Sprague, MD  sildenafil (VIAGRA) 100 MG tablet Take 0.5-1 tablets (50-100 mg total) by mouth daily as needed for erectile dysfunction. 08/07/22   Shade Flood, MD      Allergies    Patient has no known allergies.    Review of Systems   Review of Systems Review of systems Negative for head trauma.  A 10 point review of systems was performed and is negative unless otherwise reported in HPI.  Physical Exam Updated Vital Signs BP (!) 137/95   Pulse (!) 45   Temp 98.3 F (36.8 C) (Oral)   Resp 16   SpO2 98%  Physical Exam General: Normal appearing male, lying in bed.  HEENT: Sclera anicteric, MMM, trachea midline.  Cardiology: RRR, no murmurs/rubs/gallops.  Resp: Normal respiratory rate and effort. CTAB, no wheezes, rhonchi, crackles.  Abd: Soft, non-tender, non-distended. No rebound tenderness or guarding.  GU: Deferred. MSK: Swelling noted to right distal wrist and hand. No TTP, no gross deformities. Intact radial pulse, good cap refill. FROM of wrist/hand without pain. Decreased sensation to 3rd-5th digits on R hand. No pain in the rest of arm. Compartments are soft.  No anatomic snuffbox tenderness. Skin: warm, dry.  Back: No midline C spine TTP Neuro: A&Ox4, CNs II-XII grossly intact. MAEs. Sensation grossly  intact.  Psych: Normal mood and affect.   ED Results / Procedures / Treatments   Labs (all labs ordered are listed, but only abnormal results are displayed) Labs Reviewed - No data to display  EKG None  Radiology DG Hand 2 View Right  Result Date: 12/22/2022 CLINICAL DATA:  57 year old male status post fall 2 days ago. Ongoing pain and swelling. EXAM: RIGHT HAND - 2 VIEW COMPARISON:  Right wrist series today. FINDINGS: Right wrist is detailed separately. Dorsal soft tissue swelling at the wrist and proximal hand redemonstrated. Metacarpals and phalanges appear  intact and normally aligned. Normal for age joint spaces. IMPRESSION: No acute fracture or dislocation identified in the right hand. See Right Wrist series reported separately. Electronically Signed   By: Odessa Fleming M.D.   On: 12/22/2022 08:59   DG Wrist Complete Right  Result Date: 12/22/2022 CLINICAL DATA:  57 year old male status post fall 2 days ago. Ongoing pain and swelling. EXAM: RIGHT WRIST - COMPLETE 3+ VIEW COMPARISON:  Right hand series reported separately today. FINDINGS: Four views of the right wrist. Distal radius and ulna appear intact. Carpal bone alignment maintained. On the lateral view there is a subtle dorsal carpal bone crescent shaped fracture fragment (about 4 mm). No other carpal bone fracture identified. Proximal metacarpals appear intact. Dorsal soft tissue swelling. IMPRESSION: 1. Positive for small acute triquetrum fracture with small dorsal carpal fracture fragment (4 mm). Dorsal soft tissue swelling. 2. No other acute fracture or dislocation identified about the right wrist. Electronically Signed   By: Odessa Fleming M.D.   On: 12/22/2022 08:58    Procedures Procedures    Medications Ordered in ED Medications  ketorolac (TORADOL) 15 MG/ML injection 15 mg (15 mg Intramuscular Given 12/22/22 0901)    ED Course/ Medical Decision Making/ A&P                          Medical Decision Making Amount and/or Complexity of Data Reviewed Radiology: ordered. Decision-making details documented in ED Course.  Risk Prescription drug management.    MDM:    Patient presents with swelling and numbness of his right hand and wrist after a fall.  Considering orthopedic injury such as a distal radius fracture, scapholunate dislocation, other carpal or metacarpal fracture/dislocation.  He has no anatomic snuffbox tenderness to indicate scaphoid injury.  Patient actually has no pain at all and just seems to have swelling.  Swelling is isolated to the hand and wrist, I have very low concern  for DVT or more proximal pathology of the right upper extremity. Patient has no pain, good cap refill, good radial pulse, I have very low concern for compartment syndrome.  Will get x-rays to rule out bony injury but possible that patient just has a right wrist sprain with swelling that is causing numbness in the fingers.  Will elevate the right upper extremity, apply a an ice pack, and give anti-inflammatory Toradol IM to help reduce the swelling and reevaluate patient.  Clinical Course as of 12/22/22 1334  Tue Dec 22, 2022  0910 DG Hand 2 View Right No acute fracture or dislocation identified in the right hand. See Right Wrist series reported separately.   [HN]  0910 DG Wrist Complete Right 1. Positive for small acute triquetrum fracture with small dorsal carpal fracture fragment (4 mm). Dorsal soft tissue swelling. 2. No other acute fracture or dislocation identified about the right wrist.   [HN]  1610 Patient with  triquetrum fracture. This is concerning for ulnar nerve involvement.  [HN]  1157 Patient reevaluated.  His numbness and tingling in his lateral 3 digits of his right hand has resolved.  I doubt an ulnar nerve injury given that his numbness resolved with improvement of the swelling.  He still has good motion and no pain, still no concern for compartment syndrome.  Will place him in a volar short arm splint and instructed him to follow-up with orthopedic surgery.  He works as a Conservation officer, historic buildings here in the hospital and I encouraged him that if the numbness comes back to rest ice and elevate the extremity.  If the numbness tingling does not go away then he should return to the emergency department.  Patient reports understanding [HN]  1333 Patient reevaluated after splint. NVI in RUE, good ROM. DC'd w/ hand f/u.  [HN]    Clinical Course User Index [HN] Loetta Rough, MD     Imaging Studies ordered: I ordered imaging studies including R wrist and hand XR I independently  visualized and interpreted imaging. I agree with the radiologist interpretation  Additional history obtained from chart review.   Reevaluation: After the interventions noted above, I reevaluated the patient and found that they have :improved  Social Determinants of Health: Patient lives independently   Disposition:  DC w/ discharge instructions/return precautions. All questions answered to patient's satisfaction.    Co morbidities that complicate the patient evaluation  Past Medical History:  Diagnosis Date   Anxiety    Bone spur    left elbow   CKD (chronic kidney disease), stage II    Hyperlipidemia    past hx- controlled   Hypertension    Palpitations      Medicines Meds ordered this encounter  Medications   ketorolac (TORADOL) 15 MG/ML injection 15 mg    I have reviewed the patients home medicines and have made adjustments as needed  Problem List / ED Course: Problem List Items Addressed This Visit   None Visit Diagnoses     Closed nondisplaced fracture of triquetrum of right wrist, initial encounter    -  Primary                   This note was created using dictation software, which may contain spelling or grammatical errors.    Loetta Rough, MD 12/22/22 [phone]

## 2022-12-22 NOTE — ED Triage Notes (Signed)
C/o mechanical fall Sunday at work after tripping.  Right wrist pain that started Sunday night with swelling and numbness to fingers.  Right radial pulse noted in triage.  Full ROM noted.

## 2022-12-22 NOTE — Discharge Instructions (Addendum)
Thank you for coming to [facility] Emergency Department. You were seen for wrist swelling and numbness/tingling in your fingers. We did an exam, and imaging, and these showed a very small fracture of one of your carpal bones called the triquetrum. Your numbness resolved with rest, ice, and elevation. Please continue to do this at home. We have placed you in a short arm splint. Please keep the splint clean and dry. You can call the hand doctor Dr. Izora Ribas at [phone]  to follow up in the next 1-2 weeks  Do not hesitate to return to the ED or call 911 if you experience: -Worsening symptoms -Numbness/tingling in your fingers that doesn't resolve with rest, ice, and elevation -Lightheadedness, passing out -Fevers/chills -Anything else that concerns you

## 2022-12-23 DIAGNOSIS — S62114A Nondisplaced fracture of triquetrum [cuneiform] bone, right wrist, initial encounter for closed fracture: Secondary | ICD-10-CM | POA: Diagnosis not present

## 2023-01-20 ENCOUNTER — Other Ambulatory Visit (HOSPITAL_COMMUNITY): Payer: Self-pay

## 2023-01-21 ENCOUNTER — Other Ambulatory Visit (HOSPITAL_COMMUNITY): Payer: Self-pay

## 2023-02-03 ENCOUNTER — Telehealth: Payer: Self-pay | Admitting: Family Medicine

## 2023-02-03 ENCOUNTER — Other Ambulatory Visit (HOSPITAL_COMMUNITY): Payer: Self-pay

## 2023-02-03 ENCOUNTER — Other Ambulatory Visit: Payer: Self-pay | Admitting: Family Medicine

## 2023-02-03 DIAGNOSIS — Z8739 Personal history of other diseases of the musculoskeletal system and connective tissue: Secondary | ICD-10-CM

## 2023-02-03 MED ORDER — COLCHICINE 0.6 MG PO TABS
ORAL_TABLET | ORAL | 0 refills | Status: DC
  Filled 2023-02-03: qty 12, 10d supply, fill #0

## 2023-02-03 NOTE — Telephone Encounter (Signed)
This medicine has been sent to the pharmacy

## 2023-02-03 NOTE — Telephone Encounter (Signed)
Encourage patient to contact the pharmacy for refills or they can request refills through [facility]  WHAT PHARMACY WOULD THEY LIKE THIS SENT TO:  [facility] - [facility]  MEDICATION NAME & DOSE: colchicine 0.6 MG tablet  NOTES/COMMENTS FROM PATIENT:      Front office please notify patient: It takes 48-72 hours to process rx refill requests Ask patient to call pharmacy to ensure rx is ready before heading there.

## 2023-02-05 ENCOUNTER — Other Ambulatory Visit: Payer: Self-pay

## 2023-02-05 MED ORDER — AMOXICILLIN 500 MG PO CAPS
500.0000 mg | ORAL_CAPSULE | Freq: Three times a day (TID) | ORAL | 0 refills | Status: DC
  Filled 2023-02-05: qty 21, 7d supply, fill #0

## 2023-04-20 ENCOUNTER — Telehealth: Payer: Self-pay | Admitting: Family Medicine

## 2023-04-20 ENCOUNTER — Other Ambulatory Visit (HOSPITAL_COMMUNITY): Payer: Self-pay

## 2023-04-20 ENCOUNTER — Other Ambulatory Visit: Payer: Self-pay | Admitting: Family Medicine

## 2023-04-20 DIAGNOSIS — Z8739 Personal history of other diseases of the musculoskeletal system and connective tissue: Secondary | ICD-10-CM

## 2023-04-20 MED ORDER — COLCHICINE 0.6 MG PO TABS
ORAL_TABLET | ORAL | 0 refills | Status: DC
  Filled 2023-04-20: qty 12, 4d supply, fill #0

## 2023-04-20 NOTE — Telephone Encounter (Signed)
Encourage patient to contact the pharmacy for refills or they can request refills through [facility]   WHAT PHARMACY WOULD THEY LIKE THIS SENT TO:  Gerri Spore [facility] - [facility]    MEDICATION NAME & DOSE: colchicine colchicine 0.6 MG tablet NOTES/COMMENTS FROM PATIENT:      Front office please notify patient: It takes 48-72 hours to process rx refill requests Ask patient to call pharmacy to ensure rx is ready before heading there.

## 2023-04-22 ENCOUNTER — Encounter (INDEPENDENT_AMBULATORY_CARE_PROVIDER_SITE_OTHER): Payer: Self-pay

## 2023-05-06 ENCOUNTER — Other Ambulatory Visit (HOSPITAL_COMMUNITY): Payer: Self-pay

## 2023-05-13 ENCOUNTER — Encounter: Payer: Self-pay | Admitting: Family Medicine

## 2023-05-13 ENCOUNTER — Other Ambulatory Visit (HOSPITAL_COMMUNITY): Payer: Self-pay

## 2023-05-13 ENCOUNTER — Ambulatory Visit: Payer: 59 | Admitting: Family Medicine

## 2023-05-13 VITALS — BP 114/66 | HR 58 | Temp 97.8°F | Ht 67.0 in | Wt 154.0 lb

## 2023-05-13 DIAGNOSIS — Z23 Encounter for immunization: Secondary | ICD-10-CM

## 2023-05-13 DIAGNOSIS — M5412 Radiculopathy, cervical region: Secondary | ICD-10-CM

## 2023-05-13 DIAGNOSIS — Z8249 Family history of ischemic heart disease and other diseases of the circulatory system: Secondary | ICD-10-CM

## 2023-05-13 DIAGNOSIS — Z79899 Other long term (current) drug therapy: Secondary | ICD-10-CM | POA: Diagnosis not present

## 2023-05-13 DIAGNOSIS — M5442 Lumbago with sciatica, left side: Secondary | ICD-10-CM | POA: Diagnosis not present

## 2023-05-13 DIAGNOSIS — E782 Mixed hyperlipidemia: Secondary | ICD-10-CM | POA: Diagnosis not present

## 2023-05-13 DIAGNOSIS — I10 Essential (primary) hypertension: Secondary | ICD-10-CM

## 2023-05-13 DIAGNOSIS — N529 Male erectile dysfunction, unspecified: Secondary | ICD-10-CM

## 2023-05-13 LAB — COMPREHENSIVE METABOLIC PANEL
ALT: 19 U/L (ref 0–53)
AST: 15 U/L (ref 0–37)
Albumin: 4.2 g/dL (ref 3.5–5.2)
Alkaline Phosphatase: 88 U/L (ref 39–117)
BUN: 19 mg/dL (ref 6–23)
CO2: 27 meq/L (ref 19–32)
Calcium: 9.7 mg/dL (ref 8.4–10.5)
Chloride: 108 meq/L (ref 96–112)
Creatinine, Ser: 1.18 mg/dL (ref 0.40–1.50)
GFR: 68.35 mL/min (ref >60.00)
Glucose, Bld: 73 mg/dL (ref 70–99)
Potassium: 4.1 meq/L (ref 3.5–5.1)
Sodium: 142 meq/L (ref 135–145)
Total Bilirubin: 0.4 mg/dL (ref 0.2–1.2)
Total Protein: 7.2 g/dL (ref 6.0–8.3)

## 2023-05-13 LAB — LIPID PANEL
Cholesterol: 134 mg/dL (ref 0–200)
HDL: 36.4 mg/dL — ABNORMAL LOW (ref >39.00)
LDL Cholesterol: 80 mg/dL (ref 0–99)
NonHDL: 97.62
Total CHOL/HDL Ratio: 4
Triglycerides: 87 mg/dL (ref 0.0–149.0)
VLDL: 17.4 mg/dL (ref 0.0–40.0)

## 2023-05-13 MED ORDER — ROSUVASTATIN CALCIUM 10 MG PO TABS
10.0000 mg | ORAL_TABLET | Freq: Every day | ORAL | 3 refills | Status: DC
  Filled 2023-05-13 – 2023-08-20 (×2): qty 90, 90d supply, fill #0
  Filled 2023-12-03: qty 90, 90d supply, fill #1
  Filled 2024-04-01: qty 90, 90d supply, fill #2

## 2023-05-13 MED ORDER — GABAPENTIN 300 MG PO CAPS
ORAL_CAPSULE | ORAL | 1 refills | Status: DC
  Filled 2023-05-13: qty 270, fill #0

## 2023-05-13 MED ORDER — AMLODIPINE BESYLATE 10 MG PO TABS
10.0000 mg | ORAL_TABLET | Freq: Every day | ORAL | 3 refills | Status: DC
  Filled 2023-05-13 – 2023-08-20 (×2): qty 90, 90d supply, fill #0
  Filled 2023-12-03: qty 90, 90d supply, fill #1
  Filled 2024-04-01: qty 90, 90d supply, fill #2

## 2023-05-13 MED ORDER — PREDNISONE 20 MG PO TABS
40.0000 mg | ORAL_TABLET | Freq: Every day | ORAL | 0 refills | Status: DC
  Filled 2023-05-13: qty 10, 5d supply, fill #0

## 2023-05-13 MED ORDER — METOPROLOL TARTRATE 25 MG PO TABS
25.0000 mg | ORAL_TABLET | Freq: Two times a day (BID) | ORAL | 3 refills | Status: DC
  Filled 2023-05-13: qty 180, 90d supply, fill #0
  Filled 2024-01-27: qty 180, 90d supply, fill #2
  Filled 2024-04-20: qty 180, 90d supply, fill #3

## 2023-05-13 NOTE — Progress Notes (Signed)
Subjective:  Patient ID: Travis Huynh, male    DOB: [birth year]  Age: 57 y.o. MRN: [id number]  CC:  Chief Complaint  Patient presents with   Medical Management of Chronic Issues    Pt notes doing well, pt notes he is taking gabapentin 2 tabs 2 times daily Rx states 1 in am and 2 in pm   Back Pain    Pt notes back pain has continued but last two weeks he has had some Lt leg pain in the last two weeks as well    HPI Travis Huynh presents for   Low back pain with left leg pain Past 2 weeks. Nki, no falls. Had been sleeping on sofa, will be moving this Saturday. Pain goes down left leg - burning in toes.  No bowel or bladder incontinence, no saddle anesthesia, no lower extremity weakness.  Tx: tylenol, icy hot, min relief. Pain in middle of night.   Cervical radiculopathy. Left cervical radiculopathy discussed in April.  Had been seen by neurology, symptoms had improved.  Treated with gabapentin at that time. Had been better until past few weeks - sleeping on sofa past month - pain returned 2 weeks ago - same as before - burning in left arm. Prednisone in past helped. Taking gabapentin 2 BID. Still some breakthrough pain. No side effects.   Gout: Last flare: none Daily meds: none Prn med: Colchicine if needed Lab Results  Component Value Date   LABURIC 8.6 (H) 08/07/2022   Hypertension: Amlodipine 10 mg daily.  Metoprolol 25 mg twice daily. Home readings: stable.  BP Readings from Last 3 Encounters:  05/13/23 114/66  12/22/22 132/70  12/10/22 122/84   Lab Results  Component Value Date   CREATININE 1.06 11/18/2022   Hyperlipidemia: Crestor 10 mg daily, no new muscle aches or side effects.  Lab Results  Component Value Date   CHOL 136 08/07/2022   HDL 36.20 (L) 08/07/2022   LDLCALC 86 08/07/2022   TRIG 69.0 08/07/2022   CHOLHDL 4 08/07/2022   Lab Results  Component Value Date   ALT 34 08/07/2022   AST 24 08/07/2022   ALKPHOS 85 08/07/2022   BILITOT 0.5  08/07/2022   Erectile dysfunction Treated with sildenafil/Viagra 100 mg - 1/2 pill, effective.  Denies hearing/vision changes or chest pain/dyspnea with exertion, no HA/flushing.     History Patient Active Problem List   Diagnosis Date Noted   Left knee pain 03/10/2021   Hypertension    Tobacco abuse    Acute renal failure superimposed on stage 2 chronic kidney disease (HCC)    Past Medical History:  Diagnosis Date   Anxiety    Bone spur    left elbow   CKD (chronic kidney disease), stage II    Hyperlipidemia    past hx- controlled   Hypertension    Palpitations    Past Surgical History:  Procedure Laterality Date   COLONOSCOPY     MOUTH SURGERY     removed teeth   POLYPECTOMY     No Known Allergies Prior to Admission medications   Medication Sig Start Date End Date Taking? Authorizing Provider  amLODipine (NORVASC) 10 MG tablet Take 1 tablet (10 mg total) by mouth daily. 11/27/22  Yes Meriam Sprague, MD  colchicine 0.6 MG tablet Take 2 tablets by mouth at once, then take 1 tablet 1 hour later if needed for attack. 04/20/23  Yes Shade Flood, MD  gabapentin (NEURONTIN) 300 MG capsule Take 1 capsule  by mouth in the morning and 2 capsules at night. Patient taking differently: Take 2 capsule by mouth in the morning and 2 capsules at night. 11/18/22  Yes Shade Flood, MD  metoprolol tartrate (LOPRESSOR) 25 MG tablet Take 1 tablet (25 mg total) by mouth 2 (two) times daily. 11/27/22  Yes Meriam Sprague, MD  naproxen (NAPROSYN) 500 MG tablet Take 1 tablet (500 mg total) by mouth 2 (two) times daily. 12/03/22  Yes Garlon Hatchet, PA-C  rosuvastatin (CRESTOR) 10 MG tablet Take 1 tablet (10 mg total) by mouth daily. 11/27/22  Yes Meriam Sprague, MD  sildenafil (VIAGRA) 100 MG tablet Take 0.5-1 tablets (50-100 mg total) by mouth daily as needed for erectile dysfunction. 08/07/22  Yes Shade Flood, MD   Social History   Socioeconomic History   Marital  status: Married    Spouse name: Not on file   Number of children: Not on file   Years of education: Not on file   Highest education level: Not on file  Occupational History   Not on file  Tobacco Use   Smoking status: Every Day    Current packs/day: 0.25    Types: Cigarettes   Smokeless tobacco: Never   Tobacco comments:    pt smokes about 2 cigarettes a day   Vaping Use   Vaping status: Never Used  Substance and Sexual Activity   Alcohol use: Yes    Alcohol/week: 1.0 standard drink of alcohol    Types: 1 Cans of beer per week    Comment: occasionally   Drug use: Never   Sexual activity: Yes  Other Topics Concern   Not on file  Social History Narrative   Not on file   Social Determinants of Health   Financial Resource Strain: Not on file  Food Insecurity: Not on file  Transportation Needs: Not on file  Physical Activity: Not on file  Stress: Not on file  Social Connections: Not on file  Intimate Partner Violence: Not on file    Review of Systems  Constitutional:  Negative for fatigue and unexpected weight change.  Eyes:  Negative for visual disturbance.  Respiratory:  Negative for cough, chest tightness and shortness of breath.   Cardiovascular:  Negative for chest pain, palpitations and leg swelling.  Gastrointestinal:  Negative for abdominal pain and blood in stool.  Neurological:  Negative for dizziness, light-headedness and headaches.     Objective:   Vitals:   05/13/23 0826  BP: 114/66  Pulse: (!) 58  Temp: 97.8 F (36.6 C)  TempSrc: Temporal  SpO2: 98%  Weight: 154 lb (69.9 kg)  Height: 5\' 7"  (1.702 m)     Physical Exam Vitals reviewed.  Constitutional:      Appearance: He is well-developed.  HENT:     Head: Normocephalic and atraumatic.  Neck:     Vascular: No carotid bruit or JVD.  Cardiovascular:     Rate and Rhythm: Normal rate and regular rhythm.     Heart sounds: Normal heart sounds. No murmur heard. Pulmonary:     Effort:  Pulmonary effort is normal.     Breath sounds: Normal breath sounds. No rales.  Musculoskeletal:     Right lower leg: No edema.     Left lower leg: No edema.     Comments: C-spine, no midline bony tenderness, upper arm strength equal bilaterally.  Grip strength equal bilaterally.  Describes area of dysesthesias into the medial upper arm, medial elbow to  proximal forearm.  Similar to previous symptoms.  Skin intact without rash.   Lumbar spine no midline bony tenderness, negative seated straight leg raise, ambulating without assistive device.  Skin:    General: Skin is warm and dry.  Neurological:     Mental Status: He is alert and oriented to person, place, and time.  Psychiatric:        Mood and Affect: Mood normal.        Assessment & Plan:  Travis Huynh is a 57 y.o. male . Acute left-sided low back pain with left-sided sciatica - Plan: gabapentin (NEURONTIN) 300 MG capsule, predniSONE (DELTASONE) 20 MG tablet  -Noted after sleeping on the sofa, with radicular symptoms we will treat with prednisone for additional coverage of cervical radiculopathy symptoms.  Continue gabapentin, recheck 2 weeks with RTC precautions.  Without known trauma we will hold on imaging at this time.  Essential hypertension - Plan: amLODipine (NORVASC) 10 MG tablet, metoprolol tartrate (LOPRESSOR) 25 MG tablet  -  Stable, tolerating current regimen. Medications refilled. Labs pending as above.   Mixed hyperlipidemia - Plan: rosuvastatin (CRESTOR) 10 MG tablet, Comprehensive metabolic panel, Lipid panel  -  Stable, tolerating current regimen. Medications refilled. Labs pending as above.   Family history of cardiovascular disease - Plan: rosuvastatin (CRESTOR) 10 MG tablet  Medication management - Plan: rosuvastatin (CRESTOR) 10 MG tablet  Left cervical radiculopathy - Plan: gabapentin (NEURONTIN) 300 MG capsule, predniSONE (DELTASONE) 20 MG tablet  -Flare of symptoms recently, continue gabapentin,  anticipate improvement with prednisone as he improved previously.  Change from sleeping on sofa should also be helpful.  Recheck 2 weeks  Erectile dysfunction, unspecified erectile dysfunction type  -Stable with current med regimen, okay to refill when needed.  Needs flu shot - Plan: Flu vaccine trivalent PF, 6mos and older(Flulaval,Afluria,Fluarix,Fluzone)   Meds ordered this encounter  Medications   amLODipine (NORVASC) 10 MG tablet    Sig: Take 1 tablet (10 mg total) by mouth daily.    Dispense:  90 tablet    Refill:  3   metoprolol tartrate (LOPRESSOR) 25 MG tablet    Sig: Take 1 tablet (25 mg total) by mouth 2 (two) times daily.    Dispense:  180 tablet    Refill:  3   rosuvastatin (CRESTOR) 10 MG tablet    Sig: Take 1 tablet (10 mg total) by mouth daily.    Dispense:  90 tablet    Refill:  3   gabapentin (NEURONTIN) 300 MG capsule    Sig: Take 1 capsule by mouth in the morning and 2 capsules at night.    Dispense:  270 capsule    Refill:  1   predniSONE (DELTASONE) 20 MG tablet    Sig: Take 2 tablets (40 mg total) by mouth daily with breakfast.    Dispense:  10 tablet    Refill:  0   Patient Instructions  Thanks for coming in today.  Try short course of prednisone to see if that will help the back pain and flare of neck/arm issues.  Continue gabapentin dose.  I think when she has moved and back to your normal routine those areas will continue to improve.  Recheck in 2 weeks, sooner if any worsening symptoms.  No med changes at this time, if any concerns on labs I will let you know or we can discuss at your follow-up visit.  Return to the clinic or go to the nearest emergency room if any of your  symptoms worsen or new symptoms occur.     Signed,   Meredith Staggers, MD [facility], [facility] 05/13/23 9:03 AM

## 2023-05-13 NOTE — Patient Instructions (Signed)
Thanks for coming in today.  Try short course of prednisone to see if that will help the back pain and flare of neck/arm issues.  Continue gabapentin dose.  I think when she has moved and back to your normal routine those areas will continue to improve.  Recheck in 2 weeks, sooner if any worsening symptoms.  No med changes at this time, if any concerns on labs I will let you know or we can discuss at your follow-up visit.  Return to the clinic or go to the nearest emergency room if any of your symptoms worsen or new symptoms occur.

## 2023-05-20 ENCOUNTER — Emergency Department (HOSPITAL_COMMUNITY)
Admission: EM | Admit: 2023-05-20 | Discharge: 2023-05-20 | Disposition: A | Discharge status: Home / Self Care | Payer: 59 | Attending: Emergency Medicine | Admitting: Emergency Medicine

## 2023-05-20 ENCOUNTER — Other Ambulatory Visit: Payer: Self-pay

## 2023-05-20 ENCOUNTER — Encounter (HOSPITAL_COMMUNITY): Payer: Self-pay | Admitting: Emergency Medicine

## 2023-05-20 DIAGNOSIS — M79602 Pain in left arm: Secondary | ICD-10-CM | POA: Insufficient documentation

## 2023-05-20 DIAGNOSIS — M549 Dorsalgia, unspecified: Secondary | ICD-10-CM

## 2023-05-20 DIAGNOSIS — M545 Low back pain, unspecified: Secondary | ICD-10-CM | POA: Diagnosis not present

## 2023-05-20 DIAGNOSIS — M5459 Other low back pain: Secondary | ICD-10-CM | POA: Diagnosis not present

## 2023-05-20 DIAGNOSIS — M79605 Pain in left leg: Secondary | ICD-10-CM | POA: Diagnosis present

## 2023-05-20 MED ORDER — KETOROLAC TROMETHAMINE 15 MG/ML IJ SOLN
30.0000 mg | Freq: Once | INTRAMUSCULAR | Status: AC
  Administered 2023-05-20: 30 mg via INTRAMUSCULAR
  Filled 2023-05-20: qty 2

## 2023-05-20 MED ORDER — CELECOXIB 200 MG PO CAPS
200.0000 mg | ORAL_CAPSULE | Freq: Two times a day (BID) | ORAL | 0 refills | Status: DC
  Filled 2023-05-20: qty 10, 5d supply, fill #0

## 2023-05-20 MED ORDER — KETOROLAC TROMETHAMINE 15 MG/ML IJ SOLN: 15.0000 mg | Freq: Once | INTRAMUSCULAR | Status: DC

## 2023-05-20 MED ORDER — CYCLOBENZAPRINE HCL 10 MG PO TABS
10.0000 mg | ORAL_TABLET | Freq: Every day | ORAL | 0 refills | Status: DC
  Filled 2023-05-20: qty 30, 30d supply, fill #0

## 2023-05-20 MED ORDER — CYCLOBENZAPRINE HCL 10 MG PO TABS
10.0000 mg | ORAL_TABLET | Freq: Once | ORAL | Status: AC
  Administered 2023-05-20: 10 mg via ORAL
  Filled 2023-05-20: qty 1

## 2023-05-20 NOTE — ED Provider Notes (Signed)
[place] EMERGENCY DEPARTMENT AT [facility] Provider Note   CSN: [id number] Arrival date & time: 05/20/23  1947     History Chief Complaint  Patient presents with   left arm and leg pain    HPI Travis Huynh is a 57 y.o. male presenting for chief complaint of left arm and leg pain.  57 year old male minimal medical history.  History of cervical radiculopathy presented very similarly few years ago. States that this current episode feels very similar to his initial cervical radiculopathy flare with sharp shooting pains down his left arm and left leg intermittently throughout the day. Historically responded to prednisone but did not respond to current prednisone therapy per PCP. Has been taking gabapentin twice daily with some improvement of symptoms after he takes it but has symptoms in this notably during the middle of the day before his next gabapentin dose is due. Denies any incontinence, saddle anesthesia, gait instability. Otherwise ambulatory tolerating p.o. intake no history of IV drug use per patient..   Patient's recorded medical, surgical, social, medication list and allergies were reviewed in the Snapshot window as part of the initial history.   Review of Systems   Review of Systems  Constitutional:  Negative for chills and fever.  HENT:  Negative for ear pain and sore throat.   Eyes:  Negative for pain and visual disturbance.  Respiratory:  Negative for cough and shortness of breath.   Cardiovascular:  Negative for chest pain and palpitations.  Gastrointestinal:  Negative for abdominal pain and vomiting.  Genitourinary:  Negative for dysuria and hematuria.  Musculoskeletal:  Positive for arthralgias and back pain.  Skin:  Negative for color change and rash.  Neurological:  Negative for seizures and syncope.  All other systems reviewed and are negative.   Physical Exam Updated Vital Signs BP (!) 147/100 (BP Location: Left Arm)   Pulse 85   Temp  99.2 F (37.3 C) (Oral)   Resp 18   SpO2 100%  Physical Exam Vitals and nursing note reviewed.  Constitutional:      General: He is not in acute distress.    Appearance: He is well-developed.  HENT:     Head: Normocephalic and atraumatic.  Eyes:     Conjunctiva/sclera: Conjunctivae normal.  Cardiovascular:     Rate and Rhythm: Normal rate and regular rhythm.     Heart sounds: No murmur heard. Pulmonary:     Effort: Pulmonary effort is normal. No respiratory distress.     Breath sounds: Normal breath sounds.  Abdominal:     Palpations: Abdomen is soft.     Tenderness: There is no abdominal tenderness.  Musculoskeletal:        General: No swelling.     Cervical back: Neck supple.  Skin:    General: Skin is warm and dry.     Capillary Refill: Capillary refill takes less than 2 seconds.  Neurological:     Mental Status: He is alert.  Psychiatric:        Mood and Affect: Mood normal.      ED Course/ Medical Decision Making/ A&P    Procedures Procedures   Medications Ordered in ED Medications  cyclobenzaprine (FLEXERIL) tablet 10 mg (has no administration in time range)  ketorolac (TORADOL) 15 MG/ML injection 30 mg (has no administration in time range)   Medical Decision Making:   Travis Huynh is a 58 y.o. male who presented to the ED today with acute lower back pain over the past  2 weeks, detailed above.    On my initial exam, the pt was with an intact neurologic exam, tolerating ambulation with an antalgic gait and p.o. intake without difficulty.  Patient had no abnormal DTRs, no midline spinal tenderness.  Patient endorsing complete sensation of the perineum.  Patient without episodes of fecal or urinary incontinence.  Patient has no focal neurologic deficits and reassuring vital signs at this time.  No obvious physical abnormality or injury on exam. Notably, patient denies recent trauma, is afebrile, and denies IVDU.   Reviewed and confirmed nursing documentation  for past medical history, family history, social history.    Initial Assessment:   With the patient's presentation of acute back pain in the above setting, most likely diagnosis is musculoskeletal strain. Other diagnoses were considered including (but not limited to) underlying fracture, epidural hematoma, cauda equina syndrome, spinal stenosis, spinal malignancy. These are considered less likely due to history of present illness and physical exam findings.   In particular, lack of fever, substantial history of IV drug use, or substantial neurologic abnormality is less consistent with epidural abscess versus discitis or other spinal infection. In particular, no evidence of red flag symptoms including saddle anesthesia.  Also atypical to localize to lower back alone as it also involves the left arm.  More likely be a flare of his cervical radiculopathy.  Initial Plan:  Multimodal pain control described and patient informed on safe usage.  Screening evaluation including below radiographic evaluation reviewed and grossly unremarkable at this time. Patient stable for continued outpatient evaluation and management of their musculoskeletal pains.  Patient referred back to primary care provider for continued evaluation and management.  Disposition:   Based on the above findings, I believe patient is stable for discharge.    Patient and family educated about specific return precautions for given chief complaint and symptoms.  Patient and family educated about follow-up with PCP and spine center as referred for furtherSymptomatic care and management outpatient workup.  Patient and family expressed understanding of return precautions and need for follow-up. Patient spoken to regarding all imaging and laboratory results and appropriate follow up for these results. All education provided in verbal and written form and time was allowed for answering of patient questions. Patient discharged.    Emergency  Department Medication Summary:   Medications  cyclobenzaprine (FLEXERIL) tablet 10 mg (has no administration in time range)  ketorolac (TORADOL) 15 MG/ML injection 30 mg (has no administration in time range)      Clinical Impression:  1. Acute right-sided back pain, unspecified back location      Discharge   Final Clinical Impression(s) / ED Diagnoses Final diagnoses:  Acute right-sided back pain, unspecified back location    Rx / DC Orders ED Discharge Orders          Ordered    cyclobenzaprine (FLEXERIL) 10 MG tablet  Daily at bedtime        05/20/23 2012    celecoxib (CELEBREX) 200 MG capsule  2 times daily        05/20/23 2014              Glyn Ade, MD 05/20/23 2019

## 2023-05-20 NOTE — ED Triage Notes (Signed)
Patient c/o left leg and left arm pain x2 weeks. Patient report taking tylenol and gabapentin without relieve. Patient denies N/V. Patient ambulatory.

## 2023-05-20 NOTE — Discharge Instructions (Addendum)
Increase gabapentin to TID Use Flexeril at night Use Celebrex twice daily for 10 days Continue with therapy per PCP and follow up with spine center

## 2023-05-21 ENCOUNTER — Other Ambulatory Visit (HOSPITAL_COMMUNITY): Payer: Self-pay

## 2023-06-06 ENCOUNTER — Emergency Department (HOSPITAL_COMMUNITY): Payer: 59

## 2023-06-06 ENCOUNTER — Emergency Department (HOSPITAL_COMMUNITY)
Admission: EM | Admit: 2023-06-06 | Discharge: 2023-06-06 | Disposition: A | Discharge status: Home / Self Care | Payer: 59 | Attending: Emergency Medicine | Admitting: Emergency Medicine

## 2023-06-06 ENCOUNTER — Other Ambulatory Visit: Payer: Self-pay

## 2023-06-06 ENCOUNTER — Encounter (HOSPITAL_COMMUNITY): Payer: Self-pay

## 2023-06-06 DIAGNOSIS — M5136 Other intervertebral disc degeneration, lumbar region: Secondary | ICD-10-CM | POA: Diagnosis not present

## 2023-06-06 DIAGNOSIS — M5442 Lumbago with sciatica, left side: Secondary | ICD-10-CM | POA: Insufficient documentation

## 2023-06-06 DIAGNOSIS — M160 Bilateral primary osteoarthritis of hip: Secondary | ICD-10-CM | POA: Diagnosis not present

## 2023-06-06 DIAGNOSIS — M545 Low back pain, unspecified: Secondary | ICD-10-CM | POA: Diagnosis present

## 2023-06-06 DIAGNOSIS — M47816 Spondylosis without myelopathy or radiculopathy, lumbar region: Secondary | ICD-10-CM | POA: Diagnosis not present

## 2023-06-06 DIAGNOSIS — I10 Essential (primary) hypertension: Secondary | ICD-10-CM | POA: Diagnosis not present

## 2023-06-06 DIAGNOSIS — Z79899 Other long term (current) drug therapy: Secondary | ICD-10-CM | POA: Insufficient documentation

## 2023-06-06 MED ORDER — LIDOCAINE 5 % EX PTCH
1.0000 | MEDICATED_PATCH | CUTANEOUS | Status: DC
  Administered 2023-06-06: 1 via TRANSDERMAL
  Filled 2023-06-06: qty 1

## 2023-06-06 MED ORDER — MELOXICAM 15 MG PO TABS
15.0000 mg | ORAL_TABLET | Freq: Every day | ORAL | 0 refills | Status: AC
Start: 2023-06-06 — End: 2023-06-17
  Filled 2023-06-06 – 2023-06-07 (×2): qty 10, 10d supply, fill #0

## 2023-06-06 MED ORDER — LIDOCAINE 5 % EX PTCH
1.0000 | MEDICATED_PATCH | CUTANEOUS | 0 refills | Status: DC
  Filled 2023-06-06 – 2023-06-07 (×2): qty 30, 30d supply, fill #0

## 2023-06-06 MED ORDER — METHOCARBAMOL 500 MG PO TABS
500.0000 mg | ORAL_TABLET | Freq: Once | ORAL | Status: AC
  Administered 2023-06-06: 500 mg via ORAL
  Filled 2023-06-06: qty 1

## 2023-06-06 MED ORDER — METHOCARBAMOL 500 MG PO TABS
500.0000 mg | ORAL_TABLET | Freq: Two times a day (BID) | ORAL | 0 refills | Status: AC
Start: 2023-06-06 — End: 2023-06-17
  Filled 2023-06-06 – 2023-06-07 (×2): qty 20, 10d supply, fill #0

## 2023-06-06 MED ORDER — MELOXICAM 15 MG PO TABS
15.0000 mg | ORAL_TABLET | Freq: Once | ORAL | Status: AC
  Administered 2023-06-06: 15 mg via ORAL
  Filled 2023-06-06: qty 1

## 2023-06-06 NOTE — ED Provider Notes (Signed)
[place] EMERGENCY DEPARTMENT AT [facility] Provider Note   CSN: [id number] Arrival date & time: 06/06/23  4098     History  Chief Complaint  Patient presents with   Pain    Travis Huynh is a 57 y.o. male history of hyperlipidemia, gout, hypertension, low back pain, cervical radiculopathy.  Patient presents to the ER today for evaluation of left low back pain rating down the left leg, symptoms ongoing for the past 3-4 weeks.  He denies any fall or injury, pain is burning worsens with prolonged standing after work all day, he works as a Stage manager in the OR.  Pain is generally improved with rest.  He was seen in the ER for the same last week and was prescribed Celebrex and Flexeril which he has been taking with temporary relief.  He describes pain as moderate-severe.  He denies any associated numbness, saddle paresthesias, bowel/bladder incontinence, urinary retention, abdominal pain, dysuria/hematuria, IVDU, fever/chills or any additional concerns.    HPI     Home Medications Prior to Admission medications   Medication Sig Start Date End Date Taking? Authorizing Provider  lidocaine (LIDODERM) 5 % Place 1 patch onto the skin daily. Remove & Discard patch within 12 hours or as directed by MD 06/06/23  Yes Bill Salinas, PA-C  meloxicam (MOBIC) 15 MG tablet Take 1 tablet (15 mg total) by mouth daily for 10 days. 06/06/23 06/16/23 Yes Harlene Salts A, PA-C  methocarbamol (ROBAXIN) 500 MG tablet Take 1 tablet (500 mg total) by mouth 2 (two) times daily for 10 days. 06/06/23 06/16/23 Yes Harlene Salts A, PA-C  amLODipine (NORVASC) 10 MG tablet Take 1 tablet (10 mg total) by mouth daily. 05/13/23   Shade Flood, MD  gabapentin (NEURONTIN) 300 MG capsule Take 1 capsule by mouth in the morning and 2 capsules at night. 05/13/23   Shade Flood, MD  metoprolol tartrate (LOPRESSOR) 25 MG tablet Take 1 tablet (25 mg total) by mouth 2 (two) times daily. 05/13/23   Shade Flood, MD  rosuvastatin (CRESTOR) 10 MG tablet Take 1 tablet (10 mg total) by mouth daily. 05/13/23   Shade Flood, MD  sildenafil (VIAGRA) 100 MG tablet Take 0.5-1 tablets (50-100 mg total) by mouth daily as needed for erectile dysfunction. 08/07/22   Shade Flood, MD      Allergies    Patient has no known allergies.    Review of Systems   Review of Systems Ten systems are reviewed and are negative for acute change except as noted in the HPI  Physical Exam Updated Vital Signs BP 135/81 (BP Location: Left Arm)   Pulse 60   Temp 97.7 F (36.5 C) (Oral)   Resp 16   Ht 5\' 7"  (1.702 m)   Wt 69.9 kg   SpO2 100%   BMI 24.14 kg/m  Physical Exam Constitutional:      General: He is not in acute distress.    Appearance: Normal appearance. He is well-developed. He is not ill-appearing or diaphoretic.  HENT:     Head: Normocephalic and atraumatic.  Eyes:     General: Vision grossly intact. Gaze aligned appropriately.     Pupils: Pupils are equal, round, and reactive to light.  Neck:     Trachea: Trachea and phonation normal.  Cardiovascular:     Rate and Rhythm: Normal rate and regular rhythm.  Pulmonary:     Effort: Pulmonary effort is normal. No respiratory distress.  Abdominal:  General: There is no distension.     Palpations: Abdomen is soft.     Tenderness: There is no abdominal tenderness. There is no guarding or rebound.  Musculoskeletal:        General: Normal range of motion.     Cervical back: Normal range of motion.     Comments: Low back is atraumatic in appearance.  No overlying skin changes.  Normal alignment and posture.  Patient has no midline spinal tenderness palpation.  No crepitus step-off performed in the spine.  He has left paralumbar muscular tenderness and point tenderness over the left SI joint.  Some tenderness of the left gluteal muscles.  His abdomen is soft and nontender.  He has appropriate range of motion of the lumbar spine with increased  pain with flexion, left lateral bend and left rotation.  Is a positive straight leg raise on the left side.  Negative straight leg raise on the right side.  Sensation intact and equal in all distributions of the bilateral lower extremities.  5/5 strength with bilateral hip flexion, knee flexion/extension, dorsi/plantarflexion, EHL.  He has strong and equal pedal pulses.  Compartments are soft.  Negative clonus bilaterally.  Intact and equal DTRs bilateral patella and Achilles.  Bilateral lower extremity appears symmetric.  Skin:    General: Skin is warm and dry.  Neurological:     Mental Status: He is alert.     GCS: GCS eye subscore is 4. GCS verbal subscore is 5. GCS motor subscore is 6.     Comments: Speech is clear and goal oriented, follows commands Major Cranial nerves without deficit, no facial droop Moves extremities without ataxia, coordination intact  Psychiatric:        Behavior: Behavior normal.     ED Results / Procedures / Treatments   Labs (all labs ordered are listed, but only abnormal results are displayed) Labs Reviewed - No data to display  EKG None  Radiology DG Lumbar Spine Complete  Result Date: 06/06/2023 CLINICAL DATA:  Left-sided sciatica for 1 month. Left-sided pain that radiates into left hip. EXAM: LUMBAR SPINE - COMPLETE 4+ VIEW COMPARISON:  None Available. FINDINGS: The alignment of the lumbar spine is normal. Mild superior endplate wedge deformities involving T12 and L1 appear chronic. No signs of acute fracture or subluxation. Disc spaces are well preserved. Mild multilevel ventral endplate spurring noted. Mild bilateral hip osteoarthritis. IMPRESSION: 1. Mild degenerative change within the lumbar spine. 2. Mild superior endplate wedge deformities involving T12 and L1 appear chronic. 3. Mild bilateral hip osteoarthritis. Electronically Signed   By: Signa Kell M.D.   On: 06/06/2023 09:50    Procedures Procedures    Medications Ordered in  ED Medications  meloxicam (MOBIC) tablet 15 mg (has no administration in time range)  lidocaine (LIDODERM) 5 % 1 patch (1 patch Transdermal Patch Applied 06/06/23 1118)  methocarbamol (ROBAXIN) tablet 500 mg (500 mg Oral Given 06/06/23 1118)    ED Course/ Medical Decision Making/ A&P                                 Medical Decision Making 57 year old male history of hypertension, hyperlipidemia and gout presented for evaluation of acute on chronic left low back pain with left sciatica.  He has seen his PCP for this a few weeks ago and was prescribed prednisone with temporary relief.  He came to the ER around 2 weeks ago and was  prescribed Celebrex and Flexeril which has been taking with improvement but not resolution.  He reports pain is worsened over the past few days, he works as a TEFL teacher and in the OR, pain is often worsened after work all day.  He denies any trauma, no red flag symptoms.  No history of IVDU.  Vital signs are stable without fever or tachycardia.  Physical examination is significant for left paralumbar muscular tenderness and left SI joint tenderness and a positive left straight leg raise.  Sensation and strength is intact and equal to the bilateral lower extremities and reflexes are within normal limits.  Overall suspect patient to be experiencing a acute on chronic left low back pain with left sciatica and left sacroiliitis possibly due to HNP or degenerative disc disease.  He denies any previous imaging of the lumbar spine, will obtain an x-ray for evaluation of possible degenerative disc disease or large osteophytes.  At this time I do not believe MRI is warranted to have a low suspicion for cauda equina, spinal epidural abscess or myelopathy.  Additionally doubt dissection, AAA or other emergent pathologies at this time.  Amount and/or Complexity of Data Reviewed External Data Reviewed: notes.    Details: 1.  Reviewed prior ER encounter from May 20, 2023.  Patient  had presented for left arm and left leg pain.  He had a history of cervical radicular colopathy.  He reports that symptoms were similar.  He was given prednisone by PCP earlier in the week and is taking gabapentin.  Denied any red flag symptoms.  Denied IV drug use.  During that encounter no imaging performed, patient received cyclobenzaprine and Toradol.  Patient was discharged with Celebrex and Flexeril.  2.  Reviewed PCP visit from 06/02/2023.  He was seen for low back pain with left leg pain, noted that patient had been sleeping on sofa is recently.  He was also seen for cervical radiculopathy also exacerbated by sleeping on the sofa.  He also has history of gout and hypertension along with hyperlipidemia.  He had a CMP during that visit which was within normal limits.  He was prescribed prednisone 40 mg daily x 5 days for cervical radiculopathy and left sciatica Radiology: ordered.    Details: I have personally reviewed and interpreted a 5 view radiograph of the patient's lumbar spine obtained today.  It appears patient has diffuse degenerative disc disease with endplate spurring and facet arthrosis.  I do not appreciate any obvious acute displaced fracture or traumatic spondylolisthesis on these films.  He does have some mild scoliosis however suspect this to be positional.  Please see radiologist interpretation.  Risk OTC drugs. Prescription drug management. Risk Details: Patient reassessed, he is sleeping in the room, no acute distress.  I discussed radiographs with the patient today and examination findings, he stated understanding.  Overall suspect patient to be experiencing acute on chronic left low back pain due to myofascial pain, left sacroiliitis and left sciatica.  Patient is aware that is possible that he could have herniated disc or nerve impingement and that follow-up imaging may be necessary as outpatient if symptoms do not improve.  We discussed treatment options today, patient will  discontinue Celebrex and cyclobenzaprine.  We will start patient on meloxicam and methocarbamol instead.  He will continue his gabapentin.  I will also prescribe him Lidoderm patches to use.  Patient will try home PT exercises warm compresses and will rest.  We discussed strict ER precautions in regards to  his symptoms today and he stated understanding.  I have a low suspicion for cauda equina, AAA, dissection, spinal epidural abscess, kidney stone disease, myelopathy or other emergent pathologies of his left low back and left leg pain at this time.  His vital signs are stable on room air, he is afebrile.  I discussed with him ER precautions.  He will be given referral to on-call orthopedic specialist Dr. Kathline Magic office for follow-up care.    Patient denies CKD, gastric ulcer, blood thinner use or adverse reaction to NSAIDs in the past.  We discussed muscle relaxer precautions.     At this time there does not appear to be any evidence of an acute emergency medical condition and the patient appears stable for discharge with appropriate outpatient follow up. Diagnosis was discussed with patient who verbalizes understanding of care plan and is agreeable to discharge. I have discussed return precautions with patient who verbalizes understanding. Patient encouraged to follow-up with their PCP and orthopedics. All questions answered.  Note: Portions of this report may have been transcribed using voice recognition software. Every effort was made to ensure accuracy; however, inadvertent computerized transcription errors may still be present.         Final Clinical Impression(s) / ED Diagnoses Final diagnoses:  Acute left-sided low back pain with left-sided sciatica  Degenerative disc disease, lumbar    Rx / DC Orders ED Discharge Orders          Ordered    meloxicam (MOBIC) 15 MG tablet  Daily        06/06/23 1219    methocarbamol (ROBAXIN) 500 MG tablet  2 times daily        06/06/23 1219     lidocaine (LIDODERM) 5 %  Every 24 hours        06/06/23 1219              Elizabeth Palau 06/06/23 1221    Maia Plan, MD 06/15/23 1616

## 2023-06-06 NOTE — ED Notes (Signed)
Waiting on medication to come down from pharmacy   patient is ready for discharge after that

## 2023-06-06 NOTE — ED Triage Notes (Signed)
Pt reports with left sided pain x 2 weeks.

## 2023-06-06 NOTE — Discharge Instructions (Addendum)
At this time there does not appear to be the presence of an emergent medical condition, however there is always the potential for conditions to change. Please read and follow the below instructions.  Please return to the Emergency Department immediately for any new or worsening symptoms. Please be sure to follow up with your Primary Care Provider within one week regarding your visit today; please call their office to schedule an appointment even if you are feeling better for a follow-up visit. Stop taking cyclobenzaprine, this medication is being replaced with methocarbamol. Stop taking Celebrex, this medication has been replaced with meloxicam You may use the muscle relaxer Robaxin as prescribed to help with your symptoms.  Do not drive or operate heavy machinery while taking Robaxin as it will make you drowsy.  Do not drink alcohol or take other sedating medications while taking Robaxin as this will worsen side effects. You have been prescribed an NSAID-containing medication called Meloxicam today.  Do not take the medications including ibuprofen, Aleve, Advil or other NSAID-containing medications while taking Meloxicam .  Please be sure to drink enough water. You may use the Lidoderm patch as prescribed to help with your symptoms.  Lidoderm may be expensive so you may speak with your pharmacist about finding over-the-counter medications that work similarly. You may call the on-call orthopedic doctor's office, Dr. Kathline Magic office for follow-up care.  Please read the additional information packets attached to your discharge summary.  Go to the nearest Emergency Department immediately if: You have fever or chills You develop new bowel or bladder control problems. You have unusual weakness or numbness in your arms or legs. You feel faint.You develop nausea or vomiting. You develop abdominal pain. You have any new/concerning or worsening of symptoms.  Do not take your medicine if  develop an  itchy rash, swelling in your mouth or lips, or difficulty breathing; call 911 and seek immediate emergency medical attention if this occurs.  You may review your lab tests and imaging results in their entirety on your MyChart account.  Please discuss all results of fully with your primary care provider and other specialist at your follow-up visit.  Note: Portions of this text may have been transcribed using voice recognition software. Every effort was made to ensure accuracy; however, inadvertent computerized transcription errors may still be present.

## 2023-06-07 ENCOUNTER — Other Ambulatory Visit (HOSPITAL_COMMUNITY): Payer: Self-pay

## 2023-06-07 ENCOUNTER — Other Ambulatory Visit: Payer: Self-pay

## 2023-06-14 DIAGNOSIS — M5442 Lumbago with sciatica, left side: Secondary | ICD-10-CM | POA: Diagnosis not present

## 2023-07-06 DIAGNOSIS — M5442 Lumbago with sciatica, left side: Secondary | ICD-10-CM | POA: Diagnosis not present

## 2023-07-19 ENCOUNTER — Encounter (HOSPITAL_COMMUNITY): Payer: Self-pay

## 2023-07-19 ENCOUNTER — Other Ambulatory Visit: Payer: Self-pay

## 2023-07-19 ENCOUNTER — Other Ambulatory Visit (HOSPITAL_COMMUNITY): Payer: Self-pay

## 2023-07-19 ENCOUNTER — Emergency Department (HOSPITAL_COMMUNITY)
Admission: EM | Admit: 2023-07-19 | Discharge: 2023-07-19 | Disposition: A | Discharge status: Home / Self Care | Payer: 59 | Attending: Emergency Medicine | Admitting: Emergency Medicine

## 2023-07-19 ENCOUNTER — Other Ambulatory Visit (HOSPITAL_COMMUNITY): Payer: Self-pay | Admitting: Medical

## 2023-07-19 DIAGNOSIS — N182 Chronic kidney disease, stage 2 (mild): Secondary | ICD-10-CM | POA: Insufficient documentation

## 2023-07-19 DIAGNOSIS — M79604 Pain in right leg: Secondary | ICD-10-CM | POA: Diagnosis not present

## 2023-07-19 DIAGNOSIS — I129 Hypertensive chronic kidney disease with stage 1 through stage 4 chronic kidney disease, or unspecified chronic kidney disease: Secondary | ICD-10-CM | POA: Insufficient documentation

## 2023-07-19 DIAGNOSIS — G8929 Other chronic pain: Secondary | ICD-10-CM | POA: Insufficient documentation

## 2023-07-19 DIAGNOSIS — Z79899 Other long term (current) drug therapy: Secondary | ICD-10-CM | POA: Insufficient documentation

## 2023-07-19 DIAGNOSIS — M5442 Lumbago with sciatica, left side: Secondary | ICD-10-CM | POA: Insufficient documentation

## 2023-07-19 DIAGNOSIS — M5412 Radiculopathy, cervical region: Secondary | ICD-10-CM

## 2023-07-19 MED ORDER — KETOROLAC TROMETHAMINE 60 MG/2ML IM SOLN
60.0000 mg | Freq: Once | INTRAMUSCULAR | Status: AC
  Administered 2023-07-19: 60 mg via INTRAMUSCULAR
  Filled 2023-07-19: qty 2

## 2023-07-19 MED ORDER — LIDOCAINE 5 % EX PTCH
1.0000 | MEDICATED_PATCH | CUTANEOUS | Status: DC
  Administered 2023-07-19: 1 via TRANSDERMAL
  Filled 2023-07-19: qty 1

## 2023-07-19 MED ORDER — METHOCARBAMOL 500 MG PO TABS
750.0000 mg | ORAL_TABLET | Freq: Once | ORAL | Status: AC
  Administered 2023-07-19: 750 mg via ORAL
  Filled 2023-07-19: qty 2

## 2023-07-19 MED ORDER — KETOROLAC TROMETHAMINE 30 MG/ML IJ SOLN
30.0000 mg | Freq: Once | INTRAMUSCULAR | Status: DC
  Filled 2023-07-19: qty 1

## 2023-07-19 MED ORDER — GABAPENTIN 300 MG PO CAPS
ORAL_CAPSULE | ORAL | 0 refills | Status: DC
  Filled 2023-07-19: qty 14, 4d supply, fill #0

## 2023-07-19 MED ORDER — LIDOCAINE 4 % EX PTCH
1.0000 | MEDICATED_PATCH | CUTANEOUS | 0 refills | Status: DC
  Filled 2023-07-19 – 2023-07-20 (×4): qty 30, 30d supply, fill #0

## 2023-07-19 MED ORDER — PREDNISONE 10 MG (21) PO TBPK
ORAL_TABLET | Freq: Every day | ORAL | 0 refills | Status: DC
  Filled 2023-07-19: qty 21, 6d supply, fill #0

## 2023-07-19 NOTE — ED Provider Notes (Signed)
[name] Provider Note   CSN: [id number] Arrival date & time: 07/19/23  1449     History  Chief Complaint  Patient presents with   Leg Pain    Travis Huynh is a 57 y.o. male history of lumbar stenosis, cervical radiculopathy, gout presents with complaints of left lumbar pain that radiates down his left lower extremity as well as burning pain in his left medial elbow that radiates numbness into his his pinky and ring finger. His symptoms have been ongoing intermittently for the past 6 months.  Recurrence of the symptoms started 2 days ago.  Patient has been seen in the ED numerous times for these same symptoms including 9/29, 9/12.  X-rays of lumbar spine on 9/29 demonstrates diffuse multilevel mild degenerative changes.  Each time he was treated with possible axis, steroids anti-inflammatories.  He is also on gabapentin long-term at home, however he notes he recently ran out of this.  He reports he recently had a neuro evaluation by Dr. Orvan Falconer of [facility] where MRI was performed of his lumbar spine, he is still waiting on the results.     Leg Pain Associated symptoms: back pain    Past Medical History:  Diagnosis Date   Anxiety    Bone spur    left elbow   CKD (chronic kidney disease), stage II    Hyperlipidemia    past hx- controlled   Hypertension    Palpitations        Home Medications Prior to Admission medications   Medication Sig Start Date End Date Taking? Authorizing Provider  lidocaine (HM LIDOCAINE PATCH) 4 % Place 1 patch onto the skin daily. 07/19/23  Yes Halford Decamp, PA-C  predniSONE (STERAPRED UNI-PAK 21 TAB) 10 MG (21) TBPK tablet Take by mouth daily. Take 6 tabs by mouth daily  for 1 days, then 5 tabs for 1 days, then 4 tabs for 1 days, then 3 tabs for 1 days, 1 tabs for 1 days, then 1 tab by mouth daily for 1 days 07/19/23  Yes Halford Decamp, PA-C  amLODipine (NORVASC) 10 MG tablet  Take 1 tablet (10 mg total) by mouth daily. 05/13/23   Shade Flood, MD  gabapentin (NEURONTIN) 300 MG capsule Take 1 capsule by mouth in the morning and 2 capsules at night. 07/19/23   Halford Decamp, PA-C  metoprolol tartrate (LOPRESSOR) 25 MG tablet Take 1 tablet (25 mg total) by mouth 2 (two) times daily. 05/13/23   Shade Flood, MD  rosuvastatin (CRESTOR) 10 MG tablet Take 1 tablet (10 mg total) by mouth daily. 05/13/23   Shade Flood, MD  sildenafil (VIAGRA) 100 MG tablet Take 0.5-1 tablets (50-100 mg total) by mouth daily as needed for erectile dysfunction. 08/07/22   Shade Flood, MD      Allergies    Patient has no known allergies.    Review of Systems   Review of Systems  Musculoskeletal:  Positive for back pain.    Physical Exam Updated Vital Signs BP 130/81 (BP Location: Right Arm)   Pulse 75   Temp 98.3 F (36.8 C) (Oral)   Resp 16   Ht 5\' 7"  (1.702 m)   Wt 69.9 kg   SpO2 98%   BMI 24.14 kg/m  Physical Exam Vitals and nursing note reviewed.  Constitutional:      General: He is not in acute distress.    Appearance: He is well-developed.  HENT:     Head: Normocephalic and atraumatic.  Eyes:     Conjunctiva/sclera: Conjunctivae normal.  Cardiovascular:     Rate and Rhythm: Normal rate and regular rhythm.     Heart sounds: No murmur heard. Pulmonary:     Effort: Pulmonary effort is normal. No respiratory distress.     Breath sounds: Normal breath sounds.  Abdominal:     Palpations: Abdomen is soft.     Tenderness: There is no abdominal tenderness.  Musculoskeletal:        General: No swelling.     Cervical back: Neck supple.     Comments: Tender lumbar left paraspinal.  Positive CVAT.  5 out of 5 lower extremity strength.  He is NVI.  No midline tenderness.  Skin:    General: Skin is warm and dry.     Capillary Refill: Capillary refill takes less than 2 seconds.  Neurological:     Mental Status: He is alert.  Psychiatric:        Mood  and Affect: Mood normal.     ED Results / Procedures / Treatments   Labs (all labs ordered are listed, but only abnormal results are displayed) Labs Reviewed - No data to display  EKG None  Radiology No results found.  Procedures Procedures    Medications Ordered in ED Medications  lidocaine (LIDODERM) 5 % 1 patch (1 patch Transdermal Patch Applied 07/19/23 1650)  methocarbamol (ROBAXIN) tablet 750 mg (750 mg Oral Given 07/19/23 1639)  ketorolac (TORADOL) injection 60 mg (60 mg Intramuscular Given 07/19/23 1649)    ED Course/ Medical Decision Making/ A&P                                 Medical Decision Making   This patient presents to the ED with chief complaint(s) of lumbar and left leg pain with pertinent past medical history of lumbar stenosis, cervical radiculopathy.  The complaint involves an extensive differential diagnosis and also carries with it a high risk of complications and morbidity.    The differential diagnosis includes lumbar radiculopathy, spinal abscess, nephrolithiasis  The initial plan is to start with pain control with Additional history obtained: Records reviewed previous ED visit documents similar complaints  Initial Assessment:   Patient is having difficulty ambulating due to pain, medical exam is consistent with musculoskeletal etiology.  Discussed further evaluation for possible nephrolithiasis.  Using shared decision making patient declined interest and would prefer to focus on managing his acute on chronic pain  Independent visualization and interpretation of imaging: Imaging deferred at this time as he has had recent x-rays of his lumbar during prior ED visits as well as a very recent lumbar MRI  Treatment and Reassessment: IM Toradol, Robaxin and lidocaine patch  Upon reassessment patient's symptoms had improved and he feels that he is ready to return home.  Consultations obtained:   None  Disposition:   The patient has been  appropriately medically screened and/or stabilized in the ED. I have low suspicion for any other emergent medical condition which would require further screening, evaluation or treatment in the ED or require inpatient management. At time of discharge the patient is hemodynamically stable and in no acute distress. I have discussed work-up results and diagnosis with patient and answered all questions. Patient is agreeable with discharge plan. We discussed strict return precautions for returning to the emergency department and they verbalized understanding.  Patient discharged  with refill of gabapentin and lidocaine patches.  Patiently given steroid taper x 1 week.  Social Determinants of Health:   None          Final Clinical Impression(s) / ED Diagnoses Final diagnoses:  Right leg pain  Chronic left-sided low back pain with left-sided sciatica    Rx / DC Orders ED Discharge Orders          Ordered    gabapentin (NEURONTIN) 300 MG capsule        07/19/23 1816    lidocaine (HM LIDOCAINE PATCH) 4 %  Every 24 hours        07/19/23 1816    predniSONE (STERAPRED UNI-PAK 21 TAB) 10 MG (21) TBPK tablet  Daily        07/19/23 1816              Halford Decamp, PA-C 07/19/23 1919    Melene Plan, DO 07/19/23 1945

## 2023-07-19 NOTE — Discharge Instructions (Signed)
Was a pleasure taking care of you this afternoon.  You were evaluated in the emergency department for acute on chronic back pain radiating down your left side.  You were given pain medication and muscle relaxers to relieve your pain.  Upon reevaluation, side improved.  You are being prescribed a steroid taper pack, lidocaine patches as well as a weeklong refill of your gabapentin.  Please follow-up with your neurologist to review your MRI and further treatment.  Variance any new or worsening symptoms including fevers, chills, worsening back pain, difficulty urinating or blood in urine please return to the emergency department for further evaluation.

## 2023-07-19 NOTE — ED Triage Notes (Signed)
Patient is here for evaluation of left leg pain. States this started about 2 days ago. Patient reports having the same pain about 3 weeks ago and being treated for the same.

## 2023-07-20 ENCOUNTER — Other Ambulatory Visit (HOSPITAL_COMMUNITY): Payer: Self-pay

## 2023-07-20 ENCOUNTER — Other Ambulatory Visit: Payer: Self-pay

## 2023-07-22 ENCOUNTER — Telehealth: Payer: Self-pay

## 2023-07-22 NOTE — Transitions of Care (Post Inpatient/ED Visit) (Signed)
   07/22/2023  Name: Travis Huynh MRN: [id number] DOB: December 25, 1965  Today's TOC FU Call Status:    Attempted to reach the patient regarding the most recent Inpatient/ED visit.  Follow Up Plan: Patient was at work when call was made and stated unable to discuss at this time and will call back later to complete call and schedule Additional outreach attempts will be made to reach the patient to complete the Transitions of Care (Post Inpatient/ED visit) call.   [employer]

## 2023-07-27 DIAGNOSIS — M5126 Other intervertebral disc displacement, lumbar region: Secondary | ICD-10-CM | POA: Diagnosis not present

## 2023-08-17 DIAGNOSIS — M5416 Radiculopathy, lumbar region: Secondary | ICD-10-CM | POA: Diagnosis not present

## 2023-08-17 DIAGNOSIS — M5442 Lumbago with sciatica, left side: Secondary | ICD-10-CM | POA: Diagnosis not present

## 2023-08-20 ENCOUNTER — Other Ambulatory Visit (HOSPITAL_COMMUNITY): Payer: Self-pay

## 2023-08-23 ENCOUNTER — Other Ambulatory Visit (HOSPITAL_COMMUNITY): Payer: Self-pay

## 2023-08-26 NOTE — Progress Notes (Signed)
Cardiology Office Note:  .   Date:  08/27/2023  ID:  Travis Huynh, DOB 12-07-1965, MRN [id number] PCP: Shade Flood, MD  [facility] Providers Cardiologist:  Meriam Sprague, MD (Inactive)    Patient Profile: .      PMH Palpitations Hypertension Tobacco abuse Anxiety  He initially presented 10/2020 for palpitations.  Cardiac monitor revealed brief runs of nonsustained SVT with longest lasting 13 beats.  At follow-up visit 01/2021 he continued to have palpitations in the setting of stress and missing doses of metoprolol.  ETT 06/2021 was normal.    Last cardiology clinic visit was with Dr. Shari Prows on 11/27/2022 at which time his palpitations were well-controlled on metoprolol.  He had reported some left arm heaviness which resolved.  BP was well-controlled.  LDL was 86 with goal < 100.  Calcium score was discussed but he declined.  He was encouraged to quit smoking and to continue lifestyle modifications to prevent ASCVD.  1 year follow-up was recommended.       History of Present Illness: .   Travis Huynh is a very pleasant 57 y.o. male who is here today for follow-up of a single episode of heart racing on December 9th. He describes the sensation as his heart 'beating fast' and 'racing.' He denies any changes in diet or illness around the time of the episode, but he does admit to drinking a lot of hot chocolate and eating hospital food due to his work schedule. He also mentions that he consumes a lot of sugar in his coffee. He was also experiencing family stress at that time. He had dizziness and shortness of breath, requiring him to sit down to catch his breath. He denies any chest pain and reports no similar episodes since March, except for the recent one. He denies edema, orthopnea, PND, presyncope or syncope. His friend suggested the episode might have been an anxiety attack, which he considers plausible given his work and home stress. He also received an epidural  steroid injection the day after which he tolerated well. 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   Discussed the use of AI scribe software for clinical note transcription with the patient, who gave verbal consent to proceed.   ROS: See HPI       Studies Reviewed: Marland Kitchen   EKG Interpretation Date/Time:  Friday August 27 2023 15:46:32 EST Ventricular Rate:  54 PR Interval:  124 QRS Duration:  78 QT Interval:  428 QTC Calculation: 405 R Axis:   6  Text Interpretation: Sinus bradycardia When compared with ECG of 04-Nov-2021 10:29, No significant change was found No ST abnormality Confirmed by Eligha Bridegroom [phone]) on 08/27/2023 3:49:56 PM     Risk Assessment/Calculations:             Physical Exam:   VS:  BP 118/70 (BP Location: Right Arm, Patient Position: Sitting, Cuff Size: Small)   Pulse 61   Resp 16   Ht 5\' 7"  (1.702 m)   Wt 150 lb (68 kg)   SpO2 99%   BMI 23.49 kg/m    Wt Readings from Last 3 Encounters:  08/27/23 150 lb (68 kg)  07/19/23 154 lb 1.6 oz (69.9 kg)  06/06/23 154 lb 1.6 oz (69.9 kg)    GEN: Well nourished, well developed in no acute distress NECK: No JVD; No carotid bruits CARDIAC: RRR, no murmurs, rubs, gallops RESPIRATORY:  Clear to auscultation without rales, wheezing or rhonchi  ABDOMEN: Soft,  non-tender, non-distended EXTREMITIES:  No edema; No deformity     ASSESSMENT AND PLAN: .    Palpitations: Single episode of tachycardia on 08/16/2023 with associated dizziness. No chest pain or shortness of breath. Possible triggers include diet, caffeine, and stress. Realized at that time that he was only taking metoprolol 25mg  at night, resumed twice daily dosing. Advised he could use additional metoprolol as needed for additional palpitations. We will add propranolol as needed for symptomatic tachycardia episodes. Encouraged to maintain hydration, limit caffeine, and consume a balanced diet. Notify us if frequency of episodes increases.   Sinus  bradycardia: EKG reveals NSR at 54 bpm.  He is asymptomatic.  We discussed additional low-dose BB for significant palpitations or tachycardia. We will add propranolol 10 mg as needed for palpitations to avoid significant bradycardia with additional metoprolol.   Hypertension: BP is well-controlled.  He does not monitor on a routine basis.  Hyperlipidemia LDL goal < 100: Lipid panel 05/13/2023 with total cholesterol 134, triglycerides 87, HDL 36.4, LDL 80.  Continue rosuvastatin.       Dispo: 6 months with me  Signed, Eligha Bridegroom, NP-C

## 2023-08-27 ENCOUNTER — Other Ambulatory Visit (HOSPITAL_COMMUNITY): Payer: Self-pay

## 2023-08-27 ENCOUNTER — Ambulatory Visit: Payer: 59 | Attending: Nurse Practitioner | Admitting: Nurse Practitioner

## 2023-08-27 ENCOUNTER — Encounter: Payer: Self-pay | Admitting: Nurse Practitioner

## 2023-08-27 VITALS — BP 118/70 | HR 61 | Resp 16 | Ht 67.0 in | Wt 150.0 lb

## 2023-08-27 DIAGNOSIS — R002 Palpitations: Secondary | ICD-10-CM

## 2023-08-27 DIAGNOSIS — R001 Bradycardia, unspecified: Secondary | ICD-10-CM | POA: Diagnosis not present

## 2023-08-27 DIAGNOSIS — E782 Mixed hyperlipidemia: Secondary | ICD-10-CM

## 2023-08-27 DIAGNOSIS — I10 Essential (primary) hypertension: Secondary | ICD-10-CM | POA: Diagnosis not present

## 2023-08-27 MED ORDER — PROPRANOLOL HCL 10 MG PO TABS
10.0000 mg | ORAL_TABLET | Freq: Three times a day (TID) | ORAL | 6 refills | Status: AC
  Filled 2023-08-27: qty 30, 10d supply, fill #0
  Filled 2023-09-09: qty 30, 10d supply, fill #1
  Filled 2023-09-23: qty 30, 10d supply, fill #2

## 2023-08-27 NOTE — Patient Instructions (Signed)
Medication Instructions:   START Propranolol one (1) tablet ( 10 mg) by mouth as needed up to 3 times daily for palpitations or increase heart rate.   *If you need a refill on your cardiac medications before your next appointment, please call your pharmacy*   Lab Work:  None ordered.  If you have labs (blood work) drawn today and your tests are completely normal, you will receive your results only by: MyChart Message (if you have MyChart) OR A paper copy in the mail If you have any lab test that is abnormal or we need to change your treatment, we will call you to review the results.   Testing/Procedures:  None ordered.   Follow-Up: At [facility], you and your health needs are our priority.  As part of our continuing mission to provide you with exceptional heart care, we have created designated Provider Care Teams.  These Care Teams include your primary Cardiologist (physician) and Advanced Practice Providers (APPs -  Physician Assistants and Nurse Practitioners) who all work together to provide you with the care you need, when you need it.  We recommend signing up for the patient portal called "MyChart".  Sign up information is provided on this After Visit Summary.  MyChart is used to connect with patients for Virtual Visits (Telemedicine).  Patients are able to view lab/test results, encounter notes, upcoming appointments, etc.  Non-urgent messages can be sent to your provider as well.   To learn more about what you can do with MyChart, go to [email or web address].    Your next appointment:   6 month(s)  Provider:   Eligha Bridegroom, NP         Other Instructions  Your physician wants you to follow-up in: 6 months.  You will receive a reminder letter in the mail two months in advance. If you don't receive a letter, please call our office to schedule the follow-up appointment.  I will send you a mychart message in 3 months with your 8:00 appt time and date.

## 2023-09-09 ENCOUNTER — Other Ambulatory Visit (HOSPITAL_COMMUNITY): Payer: Self-pay

## 2023-09-16 DIAGNOSIS — M5416 Radiculopathy, lumbar region: Secondary | ICD-10-CM | POA: Diagnosis not present

## 2023-09-16 DIAGNOSIS — M5442 Lumbago with sciatica, left side: Secondary | ICD-10-CM | POA: Diagnosis not present

## 2023-09-23 ENCOUNTER — Other Ambulatory Visit (HOSPITAL_COMMUNITY): Payer: Self-pay

## 2023-10-13 ENCOUNTER — Other Ambulatory Visit (HOSPITAL_COMMUNITY): Payer: Self-pay

## 2023-10-28 ENCOUNTER — Other Ambulatory Visit (HOSPITAL_COMMUNITY): Payer: Self-pay

## 2023-11-29 DIAGNOSIS — M5416 Radiculopathy, lumbar region: Secondary | ICD-10-CM | POA: Diagnosis not present

## 2023-12-03 ENCOUNTER — Other Ambulatory Visit (HOSPITAL_COMMUNITY): Payer: Self-pay

## 2024-01-05 ENCOUNTER — Other Ambulatory Visit: Payer: Self-pay

## 2024-01-05 ENCOUNTER — Emergency Department (HOSPITAL_COMMUNITY)
Admission: EM | Admit: 2024-01-05 | Discharge: 2024-01-05 | Disposition: A | Discharge status: Home / Self Care | Attending: Emergency Medicine | Admitting: Emergency Medicine

## 2024-01-05 ENCOUNTER — Emergency Department (HOSPITAL_COMMUNITY)

## 2024-01-05 ENCOUNTER — Other Ambulatory Visit (HOSPITAL_COMMUNITY): Payer: Self-pay

## 2024-01-05 DIAGNOSIS — M25552 Pain in left hip: Secondary | ICD-10-CM | POA: Insufficient documentation

## 2024-01-05 DIAGNOSIS — M79605 Pain in left leg: Secondary | ICD-10-CM | POA: Diagnosis not present

## 2024-01-05 DIAGNOSIS — M8580 Other specified disorders of bone density and structure, unspecified site: Secondary | ICD-10-CM | POA: Diagnosis not present

## 2024-01-05 DIAGNOSIS — M1612 Unilateral primary osteoarthritis, left hip: Secondary | ICD-10-CM | POA: Diagnosis not present

## 2024-01-05 MED ORDER — PREDNISONE 20 MG PO TABS
60.0000 mg | ORAL_TABLET | Freq: Once | ORAL | Status: AC
  Administered 2024-01-05: 60 mg via ORAL
  Filled 2024-01-05: qty 3

## 2024-01-05 MED ORDER — HYDROCODONE-ACETAMINOPHEN 5-325 MG PO TABS
1.0000 | ORAL_TABLET | Freq: Once | ORAL | Status: AC
  Administered 2024-01-05: 1 via ORAL
  Filled 2024-01-05: qty 1

## 2024-01-05 MED ORDER — KETOROLAC TROMETHAMINE 30 MG/ML IJ SOLN
30.0000 mg | Freq: Once | INTRAMUSCULAR | Status: AC
  Administered 2024-01-05: 30 mg via INTRAMUSCULAR
  Filled 2024-01-05: qty 1

## 2024-01-05 MED ORDER — PREDNISONE 10 MG PO TABS
30.0000 mg | ORAL_TABLET | Freq: Every day | ORAL | 0 refills | Status: AC
Start: 2024-01-05 — End: 2024-01-10
  Filled 2024-01-05: qty 15, 5d supply, fill #0

## 2024-01-05 NOTE — ED Provider Notes (Signed)
 [place] EMERGENCY DEPARTMENT AT [facility] Provider Note   CSN: [id number] Arrival date & time: 01/05/24  1259     History  Chief Complaint  Patient presents with   Leg Pain    Pt reports cramping from L hip all the way down groin into knee. This has happened to him before, hx of steroid shots in the past, usually gets them every three months, last was march.    HPI Travis Huynh is a 58 y.o. male with lumbar stenosis and cervical radiculopathy presenting for left hip pain.  States it started about a week ago.  No trauma.  States the pain is primarily in his left hip but feels like it radiates from the mid lower back and then radiates down the left leg.  It is a sharp pain.  He states that this pain is very familiar to him and is required steroid injection in the past.  Endorses some tingling in the left leg as well but denies saddle anesthesia, urinary and bowel incontinence or retention and fever.  Has been taking gabapentin  at home which has helped somewhat.   Leg Pain      Home Medications Prior to Admission medications   Medication Sig Start Date End Date Taking? Authorizing Provider  predniSONE  (DELTASONE ) 10 MG tablet Take 3 tablets (30 mg total) by mouth daily for 5 days. 01/05/24 01/10/24 Yes Janalee Mcmurray, PA-C  amLODipine  (NORVASC ) 10 MG tablet Take 1 tablet (10 mg total) by mouth daily. 05/13/23   Benjiman Bras, MD  gabapentin  (NEURONTIN ) 300 MG capsule Take 1 capsule by mouth in the morning and 2 capsules at night. 07/19/23   Felicie Horning, PA-C  lidocaine  (HM LIDOCAINE  PATCH) 4 % Place 1 patch onto the skin daily. Patient not taking: Reported on 08/27/2023 07/19/23   Felicie Horning, PA-C  metoprolol  tartrate (LOPRESSOR ) 25 MG tablet Take 1 tablet (25 mg total) by mouth 2 (two) times daily. 05/13/23   Benjiman Bras, MD  propranolol  (INDERAL ) 10 MG tablet Take 1 tablet (10 mg total) by mouth 3 (three) times daily. For elevated heart rate or  palpitations. 08/27/23   Swinyer, Leilani Punter, NP  rosuvastatin  (CRESTOR ) 10 MG tablet Take 1 tablet (10 mg total) by mouth daily. 05/13/23   Benjiman Bras, MD  sildenafil  (VIAGRA ) 100 MG tablet Take 0.5-1 tablets (50-100 mg total) by mouth daily as needed for erectile dysfunction. Patient not taking: Reported on 08/27/2023 08/07/22   Benjiman Bras, MD      Allergies    Patient has no known allergies.    Review of Systems   Review of Systems  Physical Exam Updated Vital Signs BP (!) 133/92 (BP Location: Right Arm)   Pulse 62   Temp 98.5 F (36.9 C) (Oral)   Resp 16   SpO2 100%  Physical Exam Constitutional:      Appearance: Normal appearance.  HENT:     Head: Normocephalic.     Nose: Nose normal.  Eyes:     Conjunctiva/sclera: Conjunctivae normal.  Pulmonary:     Effort: Pulmonary effort is normal.  Musculoskeletal:     Right hip: Normal.     Left hip: Tenderness present. Normal range of motion.     Right foot: Normal pulse.     Left foot: Normal pulse.  Neurological:     Mental Status: He is alert.  Psychiatric:        Mood and Affect: Mood normal.  ED Results / Procedures / Treatments   Labs (all labs ordered are listed, but only abnormal results are displayed) Labs Reviewed - No data to display  EKG None  Radiology DG Hip Unilat With Pelvis 2-3 Views Left Result Date: 01/05/2024 CLINICAL DATA:  Left lower extremity pain. EXAM: DG HIP (WITH OR WITHOUT PELVIS) 2-3V LEFT COMPARISON:  None Available. FINDINGS: There is no acute fracture or dislocation. The bones are osteopenic. Moderate right and mild left hip arthritic changes. The soft tissues are unremarkable. IMPRESSION: 1. No acute fracture or dislocation. 2. Moderate right and mild left hip arthritic changes. Electronically Signed   By: Angus Bark M.D.   On: 01/05/2024 14:45    Procedures Procedures    Medications Ordered in ED Medications  predniSONE  (DELTASONE ) tablet 60 mg (has no  administration in time range)  HYDROcodone -acetaminophen  (NORCO/VICODIN) 5-325 MG per tablet 1 tablet (1 tablet Oral Given 01/05/24 1446)  ketorolac  (TORADOL ) 30 MG/ML injection 30 mg (30 mg Intramuscular Given 01/05/24 1500)    ED Course/ Medical Decision Making/ A&P                                 Medical Decision Making Amount and/or Complexity of Data Reviewed Radiology: ordered.   58 year old well-appearing male presenting for left hip pain.  Patient has known lumbar stenosis with radiculopathy.  Exam findings were unremarkable.  X-ray showed arthritic changes in both left and right hips.  Shared findings with patient.  Suspect his pain is radicular in nature.  Advised to continue the gabapentin .  Started him on 5-day course of prednisone  as well.  Workup, symptoms and clinical findings do not suggest cauda equina syndrome or acute injury to his lumbar spine.  Advised to follow-up with neurosurgery.  Discussed return precautions.  Discharged.  Also doubt DVT given that pain seems to originate in the lumbar spine and radiates sharply down the left leg.  No evidence of swelling or erythema as well in the left leg.         Final Clinical Impression(s) / ED Diagnoses Final diagnoses:  Left hip pain    Rx / DC Orders ED Discharge Orders          Ordered    predniSONE  (DELTASONE ) 10 MG tablet  Daily        01/05/24 1512              Brallan Denio K, PA-C 01/05/24 1514    Guadalupe Lee, MD 01/05/24 1535

## 2024-01-05 NOTE — Discharge Instructions (Signed)
 Evaluation today revealed that this is likely pain related to your known lumbar stenosis.  I am starting on a 5-day course of prednisone  and advised conservative treatment at home along with follow-up with neurosurgery.  If you have urinary incontinence or retention, bowel incontinence or retention, saddle numbness, develop a fever or new trauma to your back please return to the ED for further evaluation.

## 2024-01-27 ENCOUNTER — Other Ambulatory Visit (HOSPITAL_COMMUNITY): Payer: Self-pay

## 2024-03-01 DIAGNOSIS — M5416 Radiculopathy, lumbar region: Secondary | ICD-10-CM | POA: Diagnosis not present

## 2024-04-01 ENCOUNTER — Other Ambulatory Visit (HOSPITAL_COMMUNITY): Payer: Self-pay

## 2024-06-23 ENCOUNTER — Other Ambulatory Visit: Payer: Self-pay | Admitting: Family Medicine

## 2024-06-23 DIAGNOSIS — Z8249 Family history of ischemic heart disease and other diseases of the circulatory system: Secondary | ICD-10-CM

## 2024-06-23 DIAGNOSIS — I10 Essential (primary) hypertension: Secondary | ICD-10-CM

## 2024-06-23 DIAGNOSIS — E782 Mixed hyperlipidemia: Secondary | ICD-10-CM

## 2024-06-23 DIAGNOSIS — Z79899 Other long term (current) drug therapy: Secondary | ICD-10-CM

## 2024-07-06 ENCOUNTER — Other Ambulatory Visit (HOSPITAL_COMMUNITY): Payer: Self-pay

## 2024-07-06 ENCOUNTER — Encounter (HOSPITAL_COMMUNITY): Payer: Self-pay

## 2024-07-19 ENCOUNTER — Other Ambulatory Visit: Payer: Self-pay | Admitting: Family Medicine

## 2024-07-19 DIAGNOSIS — I10 Essential (primary) hypertension: Secondary | ICD-10-CM

## 2024-07-29 ENCOUNTER — Other Ambulatory Visit (HOSPITAL_COMMUNITY): Payer: Self-pay

## 2024-07-29 ENCOUNTER — Other Ambulatory Visit: Payer: Self-pay | Admitting: Family Medicine

## 2024-07-29 DIAGNOSIS — E782 Mixed hyperlipidemia: Secondary | ICD-10-CM

## 2024-07-29 DIAGNOSIS — Z79899 Other long term (current) drug therapy: Secondary | ICD-10-CM

## 2024-07-29 DIAGNOSIS — Z8249 Family history of ischemic heart disease and other diseases of the circulatory system: Secondary | ICD-10-CM

## 2024-07-29 DIAGNOSIS — I10 Essential (primary) hypertension: Secondary | ICD-10-CM

## 2024-07-31 ENCOUNTER — Other Ambulatory Visit (HOSPITAL_COMMUNITY): Payer: Self-pay

## 2024-07-31 ENCOUNTER — Other Ambulatory Visit: Payer: Self-pay

## 2024-07-31 MED ORDER — AMLODIPINE BESYLATE 10 MG PO TABS
10.0000 mg | ORAL_TABLET | Freq: Every day | ORAL | 3 refills | Status: AC
Start: 2024-07-31 — End: ?
  Filled 2024-07-31: qty 90, 90d supply, fill #0

## 2024-07-31 MED ORDER — ROSUVASTATIN CALCIUM 10 MG PO TABS
10.0000 mg | ORAL_TABLET | Freq: Every day | ORAL | 3 refills | Status: AC
  Filled 2024-07-31: qty 90, 90d supply, fill #0

## 2024-09-15 ENCOUNTER — Other Ambulatory Visit: Payer: Self-pay | Admitting: Family Medicine

## 2024-09-15 DIAGNOSIS — M5412 Radiculopathy, cervical region: Secondary | ICD-10-CM

## 2024-09-15 DIAGNOSIS — M5442 Lumbago with sciatica, left side: Secondary | ICD-10-CM

## 2024-09-15 DIAGNOSIS — I10 Essential (primary) hypertension: Secondary | ICD-10-CM

## 2024-09-15 NOTE — Telephone Encounter (Signed)
 Patient needs an appt.

## 2024-09-15 NOTE — Telephone Encounter (Unsigned)
 Copied from CRM #[id number]. Topic: Clinical - Medication Refill >> Sep 15, 2024  2:48 PM Aisha D wrote: Medication: gabapentin  (NEURONTIN ) 300 MG capsule, metoprolol  tartrate (LOPRESSOR ) 25 MG tablet  Has the patient contacted their pharmacy? Yes (Agent: If no, request that the patient contact the pharmacy for the refill. If patient does not wish to contact the pharmacy document the reason why and proceed with request.) (Agent: If yes, when and what did the pharmacy advise?)  This is the patient's preferred pharmacy:  [facility] [address] [place] [address] Phone: [phone] Fax: [phone]  Is this the correct pharmacy for this prescription? Yes If no, delete pharmacy and type the correct one.   Has the prescription been filled recently? No  Is the patient out of the medication? Yes  Has the patient been seen for an appointment in the last year OR does the patient have an upcoming appointment? Yes  Can we respond through MyChart? Yes  Agent: Please be advised that Rx refills may take up to 3 business days. We ask that you follow-up with your pharmacy.

## 2024-09-16 ENCOUNTER — Other Ambulatory Visit: Payer: Self-pay

## 2024-09-16 ENCOUNTER — Emergency Department (HOSPITAL_COMMUNITY): Admission: EM | Admit: 2024-09-16 | Discharge: 2024-09-16 | Disposition: A | Discharge status: Home / Self Care

## 2024-09-16 ENCOUNTER — Other Ambulatory Visit (HOSPITAL_COMMUNITY): Payer: Self-pay

## 2024-09-16 ENCOUNTER — Other Ambulatory Visit (HOSPITAL_BASED_OUTPATIENT_CLINIC_OR_DEPARTMENT_OTHER): Payer: Self-pay

## 2024-09-16 DIAGNOSIS — G8929 Other chronic pain: Secondary | ICD-10-CM | POA: Diagnosis not present

## 2024-09-16 DIAGNOSIS — M545 Low back pain, unspecified: Secondary | ICD-10-CM | POA: Diagnosis present

## 2024-09-16 DIAGNOSIS — M5442 Lumbago with sciatica, left side: Secondary | ICD-10-CM | POA: Diagnosis not present

## 2024-09-16 MED ORDER — METHOCARBAMOL 500 MG PO TABS
500.0000 mg | ORAL_TABLET | Freq: Two times a day (BID) | ORAL | 0 refills | Status: AC
  Filled 2024-09-16: qty 20, 10d supply, fill #0

## 2024-09-16 MED ORDER — LIDOCAINE 4 % EX PTCH
1.0000 | MEDICATED_PATCH | CUTANEOUS | 0 refills | Status: AC
  Filled 2024-09-16: qty 30, 30d supply, fill #0

## 2024-09-16 MED ORDER — PREDNISONE 20 MG PO TABS
40.0000 mg | ORAL_TABLET | Freq: Every day | ORAL | 0 refills | Status: AC
  Filled 2024-09-16: qty 10, 5d supply, fill #0

## 2024-09-16 MED ORDER — METHOCARBAMOL 500 MG PO TABS
500.0000 mg | ORAL_TABLET | Freq: Once | ORAL | Status: AC
  Administered 2024-09-16: 500 mg via ORAL
  Filled 2024-09-16: qty 1

## 2024-09-16 MED ORDER — LIDOCAINE 5 % EX PTCH
1.0000 | MEDICATED_PATCH | CUTANEOUS | Status: DC
  Administered 2024-09-16: 1 via TRANSDERMAL
  Filled 2024-09-16: qty 1

## 2024-09-16 MED ORDER — KETOROLAC TROMETHAMINE 15 MG/ML IJ SOLN
15.0000 mg | Freq: Once | INTRAMUSCULAR | Status: AC
  Administered 2024-09-16: 15 mg via INTRAMUSCULAR
  Filled 2024-09-16: qty 1

## 2024-09-16 NOTE — ED Provider Notes (Signed)
 " [place] EMERGENCY DEPARTMENT AT [facility] Provider Note   CSN: [id number] Arrival date & time: 09/16/24  1012     Patient presents with: Back Pain and Leg Pain   Travis Huynh is a 59 y.o. male.  With past medical history of lumbar stenosis and sciatica presents emergency room with complaint of left-sided low back pain he reports it radiates to his hip and down the front of his leg to his foot.  He has had some tingling sensation but denies any weakness or numbness.  He reports that this pain is very familiar to him and he normally requires an injection and muscle relaxers and then it goes away.  He is requesting to do the same today.  He denies any injury trauma or fall.  He denies any loss of bowel or bladder.  He does not have any saddle anesthesia.  No history of fever, IV drug use or cancer. Ambulates well.     Back Pain Associated symptoms: leg pain   Leg Pain Associated symptoms: back pain        Prior to Admission medications  Medication Sig Start Date End Date Taking? Authorizing Provider  methocarbamol  (ROBAXIN ) 500 MG tablet Take 1 tablet (500 mg total) by mouth 2 (two) times daily. 09/16/24  Yes Zlata Alcaide, Warren SAILOR, PA-C  predniSONE  (DELTASONE ) 20 MG tablet Take 2 tablets (40 mg total) by mouth daily. 09/16/24  Yes Kanyon Seibold, Warren SAILOR, PA-C  amLODipine  (NORVASC ) 10 MG tablet Take 1 tablet (10 mg total) by mouth daily. 07/31/24   Levora Reyes SAUNDERS, MD  gabapentin  (NEURONTIN ) 300 MG capsule Take 1 capsule by mouth in the morning and 2 capsules at night. 07/19/23   Donnajean Lynwood DEL, PA-C  lidocaine  (HM LIDOCAINE  PATCH) 4 % Place 1 patch onto the skin daily. 09/16/24   Keidra Withers, Warren SAILOR, PA-C  metoprolol  tartrate (LOPRESSOR ) 25 MG tablet Take 1 tablet (25 mg total) by mouth 2 (two) times daily. 05/13/23   Levora Reyes SAUNDERS, MD  propranolol  (INDERAL ) 10 MG tablet Take 1 tablet (10 mg total) by mouth 3 (three) times daily. For elevated heart rate or palpitations.  08/27/23   Swinyer, Rosaline HERO, NP  rosuvastatin  (CRESTOR ) 10 MG tablet Take 1 tablet (10 mg total) by mouth daily. 07/31/24   Levora Reyes SAUNDERS, MD  sildenafil  (VIAGRA ) 100 MG tablet Take 0.5-1 tablets (50-100 mg total) by mouth daily as needed for erectile dysfunction. Patient not taking: Reported on 08/27/2023 08/07/22   Levora Reyes SAUNDERS, MD    Allergies: Patient has no known allergies.    Review of Systems  Musculoskeletal:  Positive for back pain.    Updated Vital Signs BP 134/85 (BP Location: Left Arm)   Pulse (!) 51   Temp 98.8 F (37.1 C) (Oral)   Resp 16   SpO2 99%   Physical Exam Vitals and nursing note reviewed.  Constitutional:      General: He is not in acute distress.    Appearance: He is not toxic-appearing.  HENT:     Head: Normocephalic and atraumatic.  Eyes:     General: No scleral icterus.    Conjunctiva/sclera: Conjunctivae normal.  Cardiovascular:     Rate and Rhythm: Normal rate and regular rhythm.     Pulses: Normal pulses.     Heart sounds: Normal heart sounds.  Pulmonary:     Effort: Pulmonary effort is normal. No respiratory distress.     Breath sounds: Normal breath sounds.  Abdominal:  General: Abdomen is flat. Bowel sounds are normal.     Palpations: Abdomen is soft.     Tenderness: There is no abdominal tenderness.  Musculoskeletal:     Comments: Locates pain to low back and left lateral hip, mild reproducible tenderness, no midline TTP, step off or deformity.  Sensation of left foot intact, strong DP pulse. No edema or calf tenderness.   Skin:    General: Skin is warm and dry.     Findings: No lesion.  Neurological:     General: No focal deficit present.     Mental Status: He is alert and oriented to person, place, and time. Mental status is at baseline.     (all labs ordered are listed, but only abnormal results are displayed) Labs Reviewed - No data to display  EKG: None  Radiology: No results found.   Procedures    Medications Ordered in the ED  lidocaine  (LIDODERM ) 5 % 1 patch (1 patch Transdermal Patch Applied 09/16/24 1239)  ketorolac  (TORADOL ) 15 MG/ML injection 15 mg (15 mg Intramuscular Given 09/16/24 1234)  methocarbamol  (ROBAXIN ) tablet 500 mg (500 mg Oral Given by Other 09/16/24 1234)                                    Medical Decision Making Risk OTC drugs. Prescription drug management.   This patient presents to the ED for concern of low back pain, this involves an extensive number of treatment options, and is a complaint that carries with it a high risk of complications and morbidity.  The differential diagnosis includes MSK in nature, fracture, epidural hematoma/abscess, cauda equina syndrome, spinal stenosis, spinal malignancy, discitis, spinal infection, spondylitises/ spondylosis, conus medullaris, DDD of the back.   Cardiac Monitoring: / EKG:  The patient was maintained on a cardiac monitor.     Problem List / ED Course / Critical interventions / Medication management  Patient comes in with complaint of 1 day of left-sided low back pain that radiates down his left leg.  He denies any specific injury trauma or fall.  He has history of sciatica and reports that this feels similarly.  He is able to ambulate.  His strength and sensation are intact.  He has strong radial pulse.  No obvious edema.  No midline tenderness step-off or deformity. Patient was seen here for similar complaint 01/05/2024 found to have negative left-sided hip x-ray and on 07/19/2023.  Rule out ddx including abscess, cauda equina syndrome, malignancy less likely given history of present illness. No red flag symptoms indication need for emergent MR at this time.   I ordered medication including Robaxin , lidocaine  patch, Toradol  Reevaluation of the patient after these medicines showed that the patient improved I have reviewed the patients home medicines and have made adjustments as needed. The absence of red flag  symptoms and pain well-controlled, able to ambulate with steady gait.  Feel patient is appropriate for discharge with outpatient follow-up.  Given return precautions.        Final diagnoses:  Chronic left-sided low back pain with left-sided sciatica    ED Discharge Orders          Ordered    lidocaine  (HM LIDOCAINE  PATCH) 4 %  Every 24 hours        09/16/24 1219    methocarbamol  (ROBAXIN ) 500 MG tablet  2 times daily        09/16/24 1219  predniSONE  (DELTASONE ) 20 MG tablet  Daily        09/16/24 1219               Amilcar Reever, Warren SAILOR, PA-C 09/16/24 1242    Neysa Caron PARAS, DO 09/16/24 1454  "

## 2024-09-16 NOTE — Discharge Instructions (Signed)
 Please call spine doctor to schedule appointment for follow-up. For pain control you can alternate Tylenol  and ibuprofen .  You can take 1000 mg of Tylenol  every 8 hours.   I have sent a course of steroids that you can take for the 5 days.  After doing the steroids you can take 800 mg of ibuprofen  every 8 hours with food.  Try back send which is a muscle relaxer but do not drive or operate heavy machinery. Try ice, heat or lidocaine  patch over area of pain. Return to ED with new or worsening symptoms.

## 2024-09-16 NOTE — ED Triage Notes (Addendum)
 Pt reports hx of sciatica and pt c/o sciatiac pain on left side x last night, starts in lower back and radiates to left leg and foot. Pt reports injections in the past for it , also used cream at home but no relief. Pt denies injury or trauma

## 2024-09-18 ENCOUNTER — Other Ambulatory Visit (HOSPITAL_COMMUNITY): Payer: Self-pay

## 2024-09-18 NOTE — Telephone Encounter (Signed)
 Called to make an appointment no answer, unable to LM at this time

## 2024-09-19 ENCOUNTER — Encounter (HOSPITAL_COMMUNITY): Payer: Self-pay

## 2024-09-19 ENCOUNTER — Other Ambulatory Visit (HOSPITAL_COMMUNITY): Payer: Self-pay

## 2024-09-19 MED ORDER — GABAPENTIN 300 MG PO CAPS
ORAL_CAPSULE | ORAL | 0 refills | Status: AC
  Filled 2024-09-19 – 2024-09-20 (×2): qty 90, 30d supply, fill #0

## 2024-09-19 MED ORDER — METOPROLOL TARTRATE 25 MG PO TABS
25.0000 mg | ORAL_TABLET | Freq: Two times a day (BID) | ORAL | 0 refills | Status: AC
  Filled 2024-09-19 – 2024-09-27 (×3): qty 30, 15d supply, fill #0

## 2024-09-19 NOTE — Addendum Note (Signed)
 Addended by: Ronel Rodeheaver K on: 09/19/2024 10:15 AM   Modules accepted: Orders

## 2024-09-19 NOTE — Addendum Note (Signed)
 Addended by: Zaidy Absher R on: 09/19/2024 03:49 PM   Modules accepted: Orders

## 2024-09-19 NOTE — Telephone Encounter (Signed)
 See prior message.  Also should be advised to not combine the propranolol  that he has been prescribed previously with the metoprolol  as that does work in a similar manner.  Let me know if there are questions.

## 2024-09-19 NOTE — Telephone Encounter (Signed)
 Called patient and made an appointment, notes needs Metoprolol  as well as Gabapentin  reports he does not see Dr Donnajean any longer and does need this filled by PCP if possible  Pended both Rx below system would not allow me to sign either at this time

## 2024-09-19 NOTE — Telephone Encounter (Signed)
 Last visit with me in 05/2023. No recent labs. I will send in a 30 day supply in order for him to be seen with labs during that time. Please schedule appt in that time.

## 2024-09-20 ENCOUNTER — Other Ambulatory Visit (HOSPITAL_COMMUNITY): Payer: Self-pay

## 2024-09-21 ENCOUNTER — Encounter: Payer: Self-pay | Admitting: Pharmacist

## 2024-09-21 ENCOUNTER — Other Ambulatory Visit: Payer: Self-pay

## 2024-09-25 ENCOUNTER — Ambulatory Visit: Admitting: Family Medicine

## 2024-09-26 ENCOUNTER — Other Ambulatory Visit: Payer: Self-pay

## 2024-09-27 ENCOUNTER — Other Ambulatory Visit (HOSPITAL_COMMUNITY): Payer: Self-pay

## 2024-09-27 ENCOUNTER — Ambulatory Visit: Admitting: Family Medicine

## 2024-09-30 ENCOUNTER — Other Ambulatory Visit (HOSPITAL_COMMUNITY): Payer: Self-pay

## 2024-10-04 ENCOUNTER — Other Ambulatory Visit: Payer: Self-pay

## 2024-10-11 ENCOUNTER — Other Ambulatory Visit: Payer: Self-pay | Admitting: Family Medicine

## 2024-10-11 DIAGNOSIS — I10 Essential (primary) hypertension: Secondary | ICD-10-CM
